# Patient Record
Sex: Male | Born: 1950
Health system: Southern US, Community
[De-identification: ages and names within clinical notes are randomized; demographics above are authoritative.]

## PROBLEM LIST (undated history)

## (undated) DIAGNOSIS — M359 Systemic involvement of connective tissue, unspecified: Secondary | ICD-10-CM

## (undated) DIAGNOSIS — M1990 Unspecified osteoarthritis, unspecified site: Secondary | ICD-10-CM

## (undated) DIAGNOSIS — R39198 Other difficulties with micturition: Secondary | ICD-10-CM

## (undated) DIAGNOSIS — I729 Aneurysm of unspecified site: Secondary | ICD-10-CM

## (undated) DIAGNOSIS — G8929 Other chronic pain: Secondary | ICD-10-CM

## (undated) DIAGNOSIS — E039 Hypothyroidism, unspecified: Secondary | ICD-10-CM

## (undated) DIAGNOSIS — M797 Fibromyalgia: Secondary | ICD-10-CM

## (undated) DIAGNOSIS — J439 Emphysema, unspecified: Secondary | ICD-10-CM

## (undated) DIAGNOSIS — R233 Spontaneous ecchymoses: Secondary | ICD-10-CM

## (undated) DIAGNOSIS — K4090 Unilateral inguinal hernia, without obstruction or gangrene, not specified as recurrent: Secondary | ICD-10-CM

## (undated) DIAGNOSIS — N400 Enlarged prostate without lower urinary tract symptoms: Secondary | ICD-10-CM

## (undated) DIAGNOSIS — J449 Chronic obstructive pulmonary disease, unspecified: Secondary | ICD-10-CM

## (undated) DIAGNOSIS — R519 Headache, unspecified: Secondary | ICD-10-CM

## (undated) DIAGNOSIS — M0520 Rheumatoid vasculitis with rheumatoid arthritis of unspecified site: Secondary | ICD-10-CM

## (undated) DIAGNOSIS — G4700 Insomnia, unspecified: Secondary | ICD-10-CM

## (undated) DIAGNOSIS — M549 Dorsalgia, unspecified: Secondary | ICD-10-CM

## (undated) DIAGNOSIS — W57XXXA Bitten or stung by nonvenomous insect and other nonvenomous arthropods, initial encounter: Secondary | ICD-10-CM

## (undated) DIAGNOSIS — Z8719 Personal history of other diseases of the digestive system: Secondary | ICD-10-CM

## (undated) DIAGNOSIS — J189 Pneumonia, unspecified organism: Secondary | ICD-10-CM

## (undated) DIAGNOSIS — S30861A Insect bite (nonvenomous) of abdominal wall, initial encounter: Secondary | ICD-10-CM

## (undated) DIAGNOSIS — R238 Other skin changes: Secondary | ICD-10-CM

## (undated) DIAGNOSIS — R112 Nausea with vomiting, unspecified: Secondary | ICD-10-CM

## (undated) DIAGNOSIS — E079 Disorder of thyroid, unspecified: Secondary | ICD-10-CM

## (undated) DIAGNOSIS — K648 Other hemorrhoids: Secondary | ICD-10-CM

## (undated) DIAGNOSIS — K759 Inflammatory liver disease, unspecified: Secondary | ICD-10-CM

## (undated) DIAGNOSIS — R0602 Shortness of breath: Secondary | ICD-10-CM

## (undated) DIAGNOSIS — K2970 Gastritis, unspecified, without bleeding: Secondary | ICD-10-CM

## (undated) DIAGNOSIS — Z9889 Other specified postprocedural states: Secondary | ICD-10-CM

## (undated) DIAGNOSIS — J42 Unspecified chronic bronchitis: Secondary | ICD-10-CM

## (undated) DIAGNOSIS — J45909 Unspecified asthma, uncomplicated: Secondary | ICD-10-CM

## (undated) DIAGNOSIS — R51 Headache: Secondary | ICD-10-CM

## (undated) HISTORY — DX: Unilateral inguinal hernia, without obstruction or gangrene, not specified as recurrent: K40.90

## (undated) HISTORY — PX: SHOULDER ARTHROSCOPY W/ ROTATOR CUFF REPAIR: SHX2400

## (undated) HISTORY — PX: REPLACEMENT TOTAL KNEE: SUR1224

## (undated) HISTORY — DX: Aneurysm of unspecified site: I72.9

## (undated) HISTORY — DX: Rheumatoid vasculitis with rheumatoid arthritis of unspecified site: M05.20

## (undated) HISTORY — PX: HAMMER TOE SURGERY: SHX385

## (undated) HISTORY — DX: Other hemorrhoids: K64.8

## (undated) HISTORY — PX: CARPAL TUNNEL RELEASE: SHX101

## (undated) HISTORY — DX: Unspecified osteoarthritis, unspecified site: M19.90

## (undated) HISTORY — DX: Insomnia, unspecified: G47.00

## (undated) HISTORY — PX: ESOPHAGOGASTRODUODENOSCOPY (EGD) WITH ESOPHAGEAL DILATION: SHX5812

## (undated) HISTORY — PX: LUMBAR DISC SURGERY: SHX700

## (undated) HISTORY — PX: TOTAL HIP ARTHROPLASTY: SHX124

## (undated) HISTORY — DX: Gastritis, unspecified, without bleeding: K29.70

## (undated) HISTORY — PX: NASAL SINUS SURGERY: SHX719

## (undated) HISTORY — PX: COLONOSCOPY: SHX174

## (undated) HISTORY — PX: HERNIA REPAIR: SHX51

## (undated) HISTORY — PX: LUMBAR SPINE SURGERY: SHX701

## (undated) HISTORY — PX: KNEE ARTHROSCOPY: SUR90

---

## 1997-08-08 ENCOUNTER — Ambulatory Visit: Admission: RE | Admit: 1997-08-08 | Discharge: 1997-08-08 | Payer: Self-pay | Admitting: Pulmonary Disease

## 1997-10-11 ENCOUNTER — Ambulatory Visit (HOSPITAL_COMMUNITY): Admission: RE | Admit: 1997-10-11 | Discharge: 1997-10-11 | Payer: Self-pay | Admitting: Pulmonary Disease

## 1997-11-18 ENCOUNTER — Ambulatory Visit (HOSPITAL_BASED_OUTPATIENT_CLINIC_OR_DEPARTMENT_OTHER): Admission: RE | Admit: 1997-11-18 | Discharge: 1997-11-18 | Payer: Self-pay | Admitting: *Deleted

## 1999-09-23 ENCOUNTER — Encounter: Payer: Self-pay | Admitting: Orthopedic Surgery

## 1999-09-23 ENCOUNTER — Ambulatory Visit (HOSPITAL_COMMUNITY): Admission: RE | Admit: 1999-09-23 | Discharge: 1999-09-23 | Payer: Self-pay | Admitting: Orthopedic Surgery

## 1999-10-21 ENCOUNTER — Encounter: Payer: Self-pay | Admitting: Orthopedic Surgery

## 1999-10-26 ENCOUNTER — Encounter: Payer: Self-pay | Admitting: Orthopedic Surgery

## 1999-10-26 ENCOUNTER — Inpatient Hospital Stay (HOSPITAL_COMMUNITY): Admission: RE | Admit: 1999-10-26 | Discharge: 1999-10-30 | Payer: Self-pay | Admitting: Orthopedic Surgery

## 1999-10-26 ENCOUNTER — Encounter (INDEPENDENT_AMBULATORY_CARE_PROVIDER_SITE_OTHER): Payer: Self-pay | Admitting: Specialist

## 1999-10-28 ENCOUNTER — Encounter: Payer: Self-pay | Admitting: Orthopedic Surgery

## 2000-03-17 ENCOUNTER — Encounter: Payer: Self-pay | Admitting: Orthopedic Surgery

## 2000-03-18 ENCOUNTER — Inpatient Hospital Stay (HOSPITAL_COMMUNITY): Admission: RE | Admit: 2000-03-18 | Discharge: 2000-03-22 | Payer: Self-pay | Admitting: Orthopedic Surgery

## 2000-03-20 ENCOUNTER — Encounter: Payer: Self-pay | Admitting: Orthopedic Surgery

## 2000-04-08 ENCOUNTER — Ambulatory Visit (HOSPITAL_COMMUNITY): Admission: RE | Admit: 2000-04-08 | Discharge: 2000-04-08 | Payer: Self-pay | Admitting: Orthopedic Surgery

## 2000-06-03 ENCOUNTER — Encounter: Payer: Self-pay | Admitting: Pulmonary Disease

## 2000-06-03 ENCOUNTER — Ambulatory Visit (HOSPITAL_COMMUNITY): Admission: RE | Admit: 2000-06-03 | Discharge: 2000-06-03 | Payer: Self-pay | Admitting: Pulmonary Disease

## 2000-09-02 ENCOUNTER — Ambulatory Visit (HOSPITAL_COMMUNITY): Admission: RE | Admit: 2000-09-02 | Discharge: 2000-09-02 | Payer: Self-pay | Admitting: Pulmonary Disease

## 2000-09-02 ENCOUNTER — Encounter: Payer: Self-pay | Admitting: Pulmonary Disease

## 2000-09-28 ENCOUNTER — Other Ambulatory Visit: Admission: RE | Admit: 2000-09-28 | Discharge: 2000-09-28 | Payer: Self-pay | Admitting: *Deleted

## 2000-09-28 ENCOUNTER — Encounter (INDEPENDENT_AMBULATORY_CARE_PROVIDER_SITE_OTHER): Payer: Self-pay | Admitting: Specialist

## 2001-02-02 ENCOUNTER — Encounter (INDEPENDENT_AMBULATORY_CARE_PROVIDER_SITE_OTHER): Payer: Self-pay

## 2001-02-02 ENCOUNTER — Encounter: Payer: Self-pay | Admitting: Orthopedic Surgery

## 2001-02-02 ENCOUNTER — Observation Stay (HOSPITAL_COMMUNITY): Admission: RE | Admit: 2001-02-02 | Discharge: 2001-02-03 | Payer: Self-pay | Admitting: Orthopedic Surgery

## 2001-10-24 ENCOUNTER — Ambulatory Visit (HOSPITAL_BASED_OUTPATIENT_CLINIC_OR_DEPARTMENT_OTHER): Admission: RE | Admit: 2001-10-24 | Discharge: 2001-10-24 | Payer: Self-pay | Admitting: Orthopedic Surgery

## 2002-06-20 ENCOUNTER — Ambulatory Visit (HOSPITAL_COMMUNITY): Admission: RE | Admit: 2002-06-20 | Discharge: 2002-06-20 | Payer: Self-pay | Admitting: Orthopedic Surgery

## 2002-06-20 ENCOUNTER — Encounter: Payer: Self-pay | Admitting: Orthopedic Surgery

## 2003-03-13 ENCOUNTER — Encounter (HOSPITAL_COMMUNITY): Admission: RE | Admit: 2003-03-13 | Discharge: 2003-06-11 | Payer: Self-pay | Admitting: Otolaryngology

## 2003-11-19 ENCOUNTER — Observation Stay (HOSPITAL_COMMUNITY): Admission: RE | Admit: 2003-11-19 | Discharge: 2003-11-20 | Payer: Self-pay | Admitting: Orthopedic Surgery

## 2003-11-19 ENCOUNTER — Encounter (INDEPENDENT_AMBULATORY_CARE_PROVIDER_SITE_OTHER): Payer: Self-pay | Admitting: *Deleted

## 2004-10-28 ENCOUNTER — Ambulatory Visit: Payer: Self-pay | Admitting: Internal Medicine

## 2004-11-12 ENCOUNTER — Ambulatory Visit: Payer: Self-pay | Admitting: Pulmonary Disease

## 2005-08-04 ENCOUNTER — Encounter: Admission: RE | Admit: 2005-08-04 | Discharge: 2005-08-04 | Payer: Self-pay | Admitting: Orthopedic Surgery

## 2005-08-19 ENCOUNTER — Encounter: Admission: RE | Admit: 2005-08-19 | Discharge: 2005-08-19 | Payer: Self-pay | Admitting: Orthopedic Surgery

## 2005-09-24 ENCOUNTER — Ambulatory Visit (HOSPITAL_COMMUNITY): Admission: RE | Admit: 2005-09-24 | Discharge: 2005-09-25 | Payer: Self-pay | Admitting: Orthopedic Surgery

## 2007-03-03 ENCOUNTER — Inpatient Hospital Stay (HOSPITAL_COMMUNITY): Admission: AD | Admit: 2007-03-03 | Discharge: 2007-03-07 | Payer: Self-pay | Admitting: Otolaryngology

## 2008-02-09 HISTORY — PX: SPINAL CORD STIMULATOR IMPLANT: SHX2422

## 2008-09-27 ENCOUNTER — Encounter: Admission: RE | Admit: 2008-09-27 | Discharge: 2008-09-27 | Payer: Self-pay | Admitting: Orthopedic Surgery

## 2008-10-04 ENCOUNTER — Ambulatory Visit: Payer: Self-pay | Admitting: Cardiology

## 2008-10-16 ENCOUNTER — Ambulatory Visit (HOSPITAL_COMMUNITY): Admission: RE | Admit: 2008-10-16 | Discharge: 2008-10-17 | Payer: Self-pay | Admitting: Orthopedic Surgery

## 2009-12-03 ENCOUNTER — Encounter: Payer: Self-pay | Admitting: Emergency Medicine

## 2009-12-09 DIAGNOSIS — M545 Low back pain, unspecified: Secondary | ICD-10-CM | POA: Insufficient documentation

## 2009-12-09 DIAGNOSIS — M069 Rheumatoid arthritis, unspecified: Secondary | ICD-10-CM | POA: Insufficient documentation

## 2009-12-09 DIAGNOSIS — E039 Hypothyroidism, unspecified: Secondary | ICD-10-CM | POA: Insufficient documentation

## 2009-12-10 ENCOUNTER — Ambulatory Visit: Payer: Self-pay | Admitting: Emergency Medicine

## 2009-12-10 DIAGNOSIS — R0602 Shortness of breath: Secondary | ICD-10-CM | POA: Insufficient documentation

## 2009-12-10 DIAGNOSIS — J45909 Unspecified asthma, uncomplicated: Secondary | ICD-10-CM | POA: Insufficient documentation

## 2010-01-09 ENCOUNTER — Ambulatory Visit: Payer: Self-pay | Admitting: Emergency Medicine

## 2010-01-09 DIAGNOSIS — J4489 Other specified chronic obstructive pulmonary disease: Secondary | ICD-10-CM | POA: Insufficient documentation

## 2010-01-09 DIAGNOSIS — J449 Chronic obstructive pulmonary disease, unspecified: Secondary | ICD-10-CM | POA: Insufficient documentation

## 2010-01-25 ENCOUNTER — Emergency Department (HOSPITAL_COMMUNITY)
Admission: EM | Admit: 2010-01-25 | Discharge: 2010-01-25 | Payer: Self-pay | Source: Home / Self Care | Admitting: Emergency Medicine

## 2010-01-30 ENCOUNTER — Encounter: Payer: Self-pay | Admitting: Emergency Medicine

## 2010-01-30 ENCOUNTER — Telehealth (INDEPENDENT_AMBULATORY_CARE_PROVIDER_SITE_OTHER): Payer: Self-pay | Admitting: *Deleted

## 2010-02-03 ENCOUNTER — Telehealth: Payer: Self-pay | Admitting: Emergency Medicine

## 2010-02-06 ENCOUNTER — Ambulatory Visit: Payer: Self-pay | Admitting: Emergency Medicine

## 2010-03-05 ENCOUNTER — Encounter: Payer: Self-pay | Admitting: Emergency Medicine

## 2010-03-10 ENCOUNTER — Encounter: Payer: Self-pay | Admitting: Emergency Medicine

## 2010-03-10 NOTE — Assessment & Plan Note (Signed)
Summary: dyspnea, COPD/asthma, RA/MTX   Visit Type:  Initial Consult Copy to:  Dr. Marylene Land Hawks---Rheumatologist Primary Provider/Referring Provider:  Dr. Neita Carp  CC:  Pulmonary consult...patient c/o increased SOB with exertion and at rest for 2-3 months.  History of Present Illness: 60 yo former smoker, hx of RA on chronic pred and MTX (started 2011).  Childhood asthma that he grew out of. He has had trouble as an adult when he gets a URI. Has been freq treated with He has exertional dyspnea that is worse over about 2 months. Hears wheezing with exertion. As part eval had CXR, not available at this time. He tell me that the film was abnormal, not sure what it showed. Has seen Dr Sung Amabile before, has been on Advair, albuterol, maybe others.   Preventive Screening-Counseling & Management  Alcohol-Tobacco     Alcohol drinks/day: 0     Smoking Status: quit     Smoking Cessation Counseling: yes     Smoke Cessation Stage: quit     Packs/Day: 3.0     Year Started: 1964     Year Quit: 2000     Tobacco Counseling: to remain off tobacco products  Current Medications (verified): 1)  Levothyroxine Sodium 75 Mcg Tabs (Levothyroxine Sodium) .Marland Kitchen.. 1 Once Daily 2)  Avinza 45 Mg Xr24h-Cap (Morphine Sulfate Beads) .Marland Kitchen.. 1 Two Times A Day As Needed 3)  Endocet 10-325 Mg Tabs (Oxycodone-Acetaminophen) .Marland Kitchen.. 1 Once Daily As Needed 4)  Zolpidem Tartrate 10 Mg Tabs (Zolpidem Tartrate) .Marland Kitchen.. 1 At Bedtime As Needed 5)  Acetaminophen 500 Mg Tabs (Acetaminophen) .Marland Kitchen.. 1 Every 6 Hrs As Needed 6)  Prednisone 5 Mg Tabs (Prednisone) .... As Directed 7)  Folic Acid 1 Mg Tabs (Folic Acid) .Marland Kitchen.. 1 Once Daily 8)  Methotrexate 2.5 Mg Tabs (Methotrexate Sodium) .... 8 Tablets Every Week  Allergies (verified): 1)  ! Pcn  Past History:  Past Medical History: Current Problems:  ASTHMA (ICD-493.90) HYPOTHYROIDISM (ICD-244.9) BACK PAIN, LUMBAR (ICD-724.2) ARTHRITIS, RHEUMATOID (ICD-714.0)  Past Surgical History: Back  surgery x2 Sinus surgery x3 Total Hip Arthroplasty---left Total Knee Arthroplasty---left right foot surgery  Family History: Family History Asthma---father Family History Emphysema ---father Heart disease---father Throat cancer---brother  Social History: Former smoker. Quit in 2000.  married disabledAlcohol drinks/day:  0 Smoking Status:  quit Packs/Day:  3.0  Review of Systems       The patient complains of shortness of breath with activity, shortness of breath at rest, difficulty swallowing, headaches, nasal congestion/difficulty breathing through nose, hand/feet swelling, and joint stiffness or pain.  The patient denies productive cough, non-productive cough, coughing up blood, chest pain, irregular heartbeats, acid heartburn, indigestion, loss of appetite, weight change, abdominal pain, sore throat, tooth/dental problems, sneezing, itching, ear ache, anxiety, depression, rash, change in color of mucus, and fever.    Vital Signs:  Patient profile:   60 year old male Height:      70 inches (177.80 cm) Weight:      192.50 pounds (87.50 kg) BMI:     27.72 O2 Sat:      94 % on Room air Temp:     98.2 degrees F (36.78 degrees C) oral Pulse rate:   86 / minute BP sitting:   152 / 84  (left arm) Cuff size:   regular  Vitals Entered By: Michel Bickers CMA (December 10, 2009 3:37 PM)  O2 Sat at Rest %:  94 O2 Flow:  Room air  Serial Vital Signs/Assessments:  Comments: 4:16 PM Ambulatory  Pulse Oximetry  Resting; HR__81___    02 Sat__94% on room air___  Lap1 (185 feet)   HR_97____   02 Sat__97% on room air___ Lap2 (185 feet)   HR__96___   02 Sat__97% on room air___    Lap3 (185 feet)   HR_96____   02 Sat__97% on room air___  _x__Test Completed without Difficulty ___Test Stopped due to:  By: Michel Bickers CMA   CC: Pulmonary consult...patient c/o increased SOB with exertion and at rest for 2-3 months Comments Medications reviewed with patient Michel Bickers CMA  December 10, 2009 3:37 PM   Physical Exam  General:  normal appearance and healthy appearing.   Head:  normocephalic and atraumatic Eyes:  conjunctiva and sclera clear Nose:  no deformity, discharge, inflammation, or lesions Mouth:  no deformity or lesions Neck:  no masses, thyromegaly, or abnormal cervical nodes Lungs:  distant, mild low-pitched wheeze on exp Heart:  regular rate and rhythm, S1, S2 without murmurs, rubs, gallops, or clicks Abdomen:  not examined Extremities:  no edema Neurologic:  non-focal Skin:  intact without lesions or rashes Psych:  alert and cooperative; normal mood and affect; normal attention span and concentration   Impression & Recommendations:  Problem # 1:  DYSPNEA (ICD-786.05)  Suspect due to COPD +/- asthma. Consider effects or RA or MTX but no crackles on exam.  - full PFT - obtain his CXR to review - review old chart to see which regimens have been tries to date.  - ROV next available  Orders: Consultation Level IV (66440)  Patient Instructions: 1)  We will obtain your CXR from Dr Nickola Major 2)  We will perform full pulmonary function tests at your next office visit 3)  Walking oximetry today 4)  Follow up with Dr Delton Coombes at next available appointment

## 2010-03-10 NOTE — Miscellaneous (Signed)
Summary: Orders Update pft charges  Clinical Lists Changes  Orders: Added new Service order of Carbon Monoxide diffusing w/capacity (94720) - Signed Added new Service order of Lung Volumes (94240) - Signed Added new Service order of Spirometry (Pre & Post) (94060) - Signed 

## 2010-03-10 NOTE — Assessment & Plan Note (Signed)
Summary: COPD, RA/MTX   Visit Type:  Follow-up Copy to:  Dr. Marylene Land Hawks---Rheumatologist Primary Provider/Referring Provider:  Dr. Neita Carp  CC:  Followup COPD- Pt had PFT's today. pt quit smoking in 2001.  History of Present Illness: 60 yo former smoker, hx of RA on chronic pred and MTX (started 2011).  Childhood asthma that he grew out of. He has had trouble as an adult when he gets a URI. Has been freq treated with He has exertional dyspnea that is worse over about 2 months. Hears wheezing with exertion. As part eval had CXR, not available at this time. He tell me that the film was abnormal, not sure what it showed. Has seen Dr Sung Amabile before, has been on Advair, albuterol, maybe others.   ROV 01/09/10 -- dyspnea, hx tobacco, RA/MTX. Has been on Spiriva and Advair before per old chart in 05, 06. PFT today show moderate AFL, borderline BD response, no restriction. Normal DLCO. Has siblings w COPD.   Current Medications (verified): 1)  Levothyroxine Sodium 75 Mcg Tabs (Levothyroxine Sodium) .Marland Kitchen.. 1 Once Daily 2)  Avinza 45 Mg Xr24h-Cap (Morphine Sulfate Beads) .Marland Kitchen.. 1 Two Times A Day 3)  Endocet 10-325 Mg Tabs (Oxycodone-Acetaminophen) .Marland Kitchen.. 1-3 Tablet A Day As Needed 4)  Zolpidem Tartrate 10 Mg Tabs (Zolpidem Tartrate) .Marland Kitchen.. 1 At Bedtime 5)  Acetaminophen 500 Mg Tabs (Acetaminophen) .Marland Kitchen.. 1 Every 6 Hrs As Needed 6)  Folic Acid 1 Mg Tabs (Folic Acid) .Marland Kitchen.. 1 Once Daily 7)  Methotrexate 2.5 Mg Tabs (Methotrexate Sodium) .... 8 Tablets Every Week 8)  Ibuprofen 200 Mg Caps (Ibuprofen) .... As Needed  Allergies (verified): 1)  ! Pcn  Vital Signs:  Patient profile:   60 year old male Height:      70 inches Weight:      194 pounds O2 Sat:      96 % on Room air Temp:     98.0 degrees F oral Pulse rate:   77 / minute BP sitting:   138 / 82  (left arm) Cuff size:   regular  Vitals Entered By: Vernie Murders (January 09, 2010 3:49 PM)  O2 Flow:  Room air CC: Followup COPD- Pt had PFT's  today. pt quit smoking in 2001 Comments meds and allergies updated Phone number updated Carver Fila  January 09, 2010 3:54 PM    Physical Exam  General:  normal appearance and healthy appearing.   Head:  normocephalic and atraumatic Eyes:  conjunctiva and sclera clear Nose:  no deformity, discharge, inflammation, or lesions Mouth:  no deformity or lesions Neck:  no masses, thyromegaly, or abnormal cervical nodes Lungs:  distant, mild low-pitched wheeze on exp Heart:  regular rate and rhythm, S1, S2 without murmurs, rubs, gallops, or clicks Abdomen:  not examined Extremities:  no edema Neurologic:  non-focal Skin:  intact without lesions or rashes Psych:  alert and cooperative; normal mood and affect; normal attention span and concentration   Pulmonary Function Test Date: 01/09/2010 Height (in.): 70 Gender: Male  Pre-Spirometry FVC    Value: 3.76 L/min   Pred: 4.64 L/min     % Pred: 81 % FEV1    Value: 2.00 L     Pred: 3.29 L     % Pred: 61 % FEV1/FVC  Value: 53 %     Pred: 71 %     % Pred: - % FEF 25-75  Value: 0.57 L/min   Pred: 3.15 L/min     % Pred: 18 %  Post-Spirometry FVC    Value: 4.17 L/min   Pred: 4.64 L/min     % Pred: 90 % FEV1    Value: 2.10 L     Pred: 3.29 L     % Pred: 64 % FEV1/FVC  Value: 50 %     Pred: 71 %     % Pred: - % FEF 25-75  Value: 0.59 L/min   Pred: 3.15 L/min     % Pred: 19 %  Lung Volumes TLC    Value: 6.47 L   % Pred: 97 % RV    Value: 2.71 L   % Pred: 115 % DLCO    Value: 23.3 %   % Pred: 94 % DLCO/VA  Value: 4.63 %   % Pred: 122 %  Impression & Recommendations:  Problem # 1:  COPD (ICD-496) - trial symbicort - a1-at testing - rov in 1 mo to see if he benefits  Problem # 2:  ARTHRITIS, RHEUMATOID (ICD-714.0)  - CXR today to look for RA or MTX disease  Orders: Est. Patient Level IV (99214) T-2 View CXR (71020TC)  Medications Added to Medication List This Visit: 1)  Avinza 45 Mg Xr24h-cap (Morphine sulfate beads) .Marland Kitchen.. 1 two  times a day 2)  Endocet 10-325 Mg Tabs (Oxycodone-acetaminophen) .Marland Kitchen.. 1-3 tablet a day as needed 3)  Zolpidem Tartrate 10 Mg Tabs (Zolpidem tartrate) .Marland Kitchen.. 1 at bedtime 4)  Ibuprofen 200 Mg Caps (Ibuprofen) .... As needed  Patient Instructions: 1)  Repeat CXR today 2)  Alpha-1 antitrypsin testing today (to look for emphysema that runs in the family).  3)  We will try Symbicort 80/4.80mcg, 2 puffs two times a day to see if you benefit. 4)  Follow with Dr Delton Coombes in 1 month to discuss your symptoms on the new medication.

## 2010-03-12 NOTE — Progress Notes (Signed)
Summary: FYI:  ER visit for SOB/congestion  Phone Note Outgoing Call   Call placed by: Michel Bickers CMA,  January 30, 2010 10:14 AM Call placed to: Patient Summary of Call: Called to tell pt Alpha 1 test was normal per Dr. Delton Coombes. Pt wanted to let RB know that he was seen at [facility] ER on 01/24/2010 for incr SOB and congestion. He has stopped Symbicort and feels this caused his nasal congestion and SOB.  He was given a steroid injection and Ventolin HFA inhaler and will use this if necessary.  I will forward to RB so he is aware. Initial call taken by: Michel Bickers CMA,  January 30, 2010 10:22 AM

## 2010-03-12 NOTE — Letter (Signed)
Summary: Alpha 1 test  Registration Update   Imported By: Valinda Hoar 01/30/2010 11:38:42  _____________________________________________________________________  External Attachment:    Type:   Image     Comment:   External Document  Appended Document: Registration Update Alpha 1 test is normal per Dr. Delton Coombes and pt is aware.

## 2010-03-12 NOTE — Progress Notes (Signed)
Summary: resutls and follow up appoinmtments  Phone Note Call from Patient Call back at Home Phone [phone]   Caller: Patient Call For: Dr. Delton Coombes Summary of Call: Patient phoned stated that he received a call regaring his results and stated that he was informed that everything was normal. He wanted to know if he still needs to follow up with the doctor. He can be reached at [phone] Initial call taken by: Vedia Coffer,  February 03, 2010 9:25 AM  Follow-up for Phone Call        called spoke with patient who saw RB on 12.2.11 and was started on symbicort.  pt states he went to the ED on 12.17.11 d/t SOB and congestion and subsequently stopped the symbicort.  pt would like to know what RB recommends: should he keep the 1.17.12 appt?  please advise, thanks!  *pt is aware RB not in office until 12.28 afternoon. Follow-up by: Boone Master CNA/MA,  February 03, 2010 10:32 AM  Additional Follow-up for Phone Call Additional follow up Details #1::        To clarify: His a1-AT testing (for genetic predisposition to emphysema) is normal. His PFT are consistent with COPD, which is why we started the Symbicort. I would recommend he do the symbicort two times a day until our 02/24/10 and keep track of symptoms so we can decide if he benefits.  Additional Follow-up by: Leslye Peer MD,  February 04, 2010 5:06 PM    Additional Follow-up for Phone Call Additional follow up Details #2::    LMOMTCB Vernie Murders  February 04, 2010 5:09 PM  [email or web address].Carron Curie CMA  February 05, 2010 11:03 AM  pt advised to keep appt on 02-24-09. pt states he will not start back on symbicort because it makes him worse. I advised he can disuss with RB at appt.Carron Curie CMA  February 05, 2010 2:53 PM

## 2010-03-12 NOTE — Assessment & Plan Note (Addendum)
Summary: rov 1 month ///kp   Visit Type:  Follow-up Copy to:  Dr. Marylene Land Hawks---Rheumatologist Primary Provider/Referring Provider:  Dr. Neita Carp  CC:  COPD...pt d/c'ed Symbicort due to incr SOB.  History of Present Illness: 60 yo former smoker, hx of RA on chronic pred and MTX (started 2011).  Childhood asthma that he grew out of. He has had trouble as an adult when he gets a URI. Has been freq treated with He has exertional dyspnea that is worse over about 2 months. Hears wheezing with exertion. As part eval had CXR, not available at this time. He tell me that the film was abnormal, not sure what it showed. Has seen Dr Sung Amabile before, has been on Advair, albuterol, maybe others.   ROV 01/09/10 -- dyspnea, hx tobacco, RA/MTX. Has been on Spiriva and Advair before per old chart in 05, 06. PFT today show moderate AFL, borderline BD response, no restriction. Normal DLCO. Has siblings w COPD.   February 24, 2010 --Presents for follow up. Last office visit trial of symbicort was added. Pt did not see any benefit. and prefers to not take meds at this time. " I do not want to be dependent on inhalers-just want to stay active". Despite much pt education on fxn of inhalers and to preserve lung fxn, and activitytolerance he declined medications at this time. Alpha  ~1 neg.  Denies chest pain,  orthopnea, hemoptysis, fever, n/v/d, edema, headache,recent travel or antibiotics.     Preventive Screening-Counseling & Management  Alcohol-Tobacco     Alcohol drinks/day: 0     Smoking Status: quit     Packs/Day: 3.0     Year Started: 1964     Year Quit: 2000     Tobacco Counseling: to remain off tobacco products  Medications Prior to Update: 1)  Levothyroxine Sodium 75 Mcg Tabs (Levothyroxine Sodium) .Marland Kitchen.. 1 Once Daily 2)  Avinza 45 Mg Xr24h-Cap (Morphine Sulfate Beads) .Marland Kitchen.. 1 Two Times A Day 3)  Endocet 10-325 Mg Tabs (Oxycodone-Acetaminophen) .Marland Kitchen.. 1-3 Tablet A Day As Needed 4)  Zolpidem Tartrate 10 Mg  Tabs (Zolpidem Tartrate) .Marland Kitchen.. 1 At Bedtime 5)  Acetaminophen 500 Mg Tabs (Acetaminophen) .Marland Kitchen.. 1 Every 6 Hrs As Needed 6)  Folic Acid 1 Mg Tabs (Folic Acid) .Marland Kitchen.. 1 Once Daily 7)  Methotrexate 2.5 Mg Tabs (Methotrexate Sodium) .... 8 Tablets Every Week 8)  Ibuprofen 200 Mg Caps (Ibuprofen) .... As Needed 9)  Proair Hfa 108 (90 Base) Mcg/act Aers (Albuterol Sulfate) .... 2 Puffs Four Times Daily As Needed  Current Medications (verified): 1)  Levothyroxine Sodium 75 Mcg Tabs (Levothyroxine Sodium) .Marland Kitchen.. 1 Once Daily 2)  Avinza 45 Mg Xr24h-Cap (Morphine Sulfate Beads) .Marland Kitchen.. 1 Two Times A Day 3)  Endocet 10-325 Mg Tabs (Oxycodone-Acetaminophen) .Marland Kitchen.. 1-3 Tablet A Day As Needed 4)  Zolpidem Tartrate 10 Mg Tabs (Zolpidem Tartrate) .Marland Kitchen.. 1 At Bedtime 5)  Acetaminophen 500 Mg Tabs (Acetaminophen) .Marland Kitchen.. 1 Every 6 Hrs As Needed 6)  Folic Acid 1 Mg Tabs (Folic Acid) .Marland Kitchen.. 1 Once Daily 7)  Methotrexate 2.5 Mg Tabs (Methotrexate Sodium) .... 8 Tablets Every Week 8)  Ibuprofen 200 Mg Caps (Ibuprofen) .... As Needed 9)  Proair Hfa 108 (90 Base) Mcg/act Aers (Albuterol Sulfate) .... 2 Puffs Four Times Daily As Needed  Allergies (verified): 1)  ! Pcn  Past History:  Past Medical History: Last updated: 12/10/2009 Current Problems:  ASTHMA (ICD-493.90) HYPOTHYROIDISM (ICD-244.9) BACK PAIN, LUMBAR (ICD-724.2) ARTHRITIS, RHEUMATOID (ICD-714.0)  Past Surgical History: Last updated:  12/10/2009 Back surgery x2 Sinus surgery x3 Total Hip Arthroplasty---left Total Knee Arthroplasty---left right foot surgery  Family History: Last updated: 12/10/2009 Family History Asthma---father Family History Emphysema ---father Heart disease---father Throat cancer---brother  Social History: Last updated: 12/10/2009 Former smoker. Quit in 2000.  married disabled  Risk Factors: Smoking Status: quit (02/24/2010) Packs/Day: 3.0 (02/24/2010)  Review of Systems      See HPI  Vital Signs:  Patient profile:   60  year old male Height:      70 inches (177.80 cm) Weight:      198.56 pounds (90.25 kg) BMI:     28.59 O2 Sat:      95 % on Room air Temp:     98.1 degrees F (36.72 degrees C) oral Pulse rate:   79 / minute BP sitting:   166 / 88  (left arm) Cuff size:   regular  Vitals Entered By: Michel Bickers CMA (February 24, 2010 11:59 AM)  O2 Sat at Rest %:  95 O2 Flow:  Room air CC: COPD...pt d/c'ed Symbicort due to incr SOB Is Patient Diabetic? No Comments Medications reviewed with patient Michel Bickers CMA  February 24, 2010 12:02 PM   Physical Exam  Additional Exam:  GEN: A/Ox3; pleasant , NAD HEENT:  [place]/AT, , EACs-clear, TMs-wnl, NOSE-clear, THROAT-clear NECK:  Supple w/ fair ROM; no JVD; normal carotid impulses w/o bruits; no thyromegaly or nodules palpated; no lymphadenopathy. RESP  Clear to P & A diminished in bases  CARD:  RRR, no m/r/g   GI:   Soft & nt; nml bowel sounds; no organomegaly or masses detected. [place]co: Warm bil,  no calf tenderness edema, clubbing, pulses intact Neuro: intact w/ no focal deficits noted.   Impression & Recommendations:  Problem # 1:  COPD (ICD-496) alpha 1 neg.  cont with current regimen follow up 6months -reevaluate for rx therapy.  Other Orders: Est. Patient Level II (16109)  Patient Instructions: 1)  Continue on current regimen.  2)  follow up Dr. Delton Coombes in 6 months and as needed

## 2010-03-12 NOTE — Assessment & Plan Note (Signed)
Summary: rov 1 month ///kp   Visit Type:  Follow-up Copy to:  Dr. Marylene Land Hawks---Rheumatologist Primary Provider/Referring Provider:  Dr. Neita Carp  CC:  COPD...discontinued Symbicort due to incr SOB.  History of Present Illness: 60 yo former smoker, hx of RA on chronic pred and MTX (started 2011).  Childhood asthma that he grew out of. He has had trouble as an adult when he gets a URI. Has been freq treated with He has exertional dyspnea that is worse over about 2 months. Hears wheezing with exertion. As part eval had CXR, not available at this time. He tell me that the film was abnormal, not sure what it showed. Has seen Dr Sung Amabile before, has been on Advair, albuterol, maybe others.   ROV 01/09/10 -- dyspnea, hx tobacco, RA/MTX. Has been on Spiriva and Advair before per old chart in 05, 06. PFT today show moderate AFL, borderline BD response, no restriction. Normal DLCO. Has siblings w COPD.   ROV 02/24/10 --   Current Medications (verified): 1)  Levothyroxine Sodium 75 Mcg Tabs (Levothyroxine Sodium) .Marland Kitchen.. 1 Once Daily 2)  Avinza 45 Mg Xr24h-Cap (Morphine Sulfate Beads) .Marland Kitchen.. 1 Two Times A Day 3)  Endocet 10-325 Mg Tabs (Oxycodone-Acetaminophen) .Marland Kitchen.. 1-3 Tablet A Day As Needed 4)  Zolpidem Tartrate 10 Mg Tabs (Zolpidem Tartrate) .Marland Kitchen.. 1 At Bedtime 5)  Acetaminophen 500 Mg Tabs (Acetaminophen) .Marland Kitchen.. 1 Every 6 Hrs As Needed 6)  Folic Acid 1 Mg Tabs (Folic Acid) .Marland Kitchen.. 1 Once Daily 7)  Methotrexate 2.5 Mg Tabs (Methotrexate Sodium) .... 8 Tablets Every Week 8)  Ibuprofen 200 Mg Caps (Ibuprofen) .... As Needed 9)  Proair Hfa 108 (90 Base) Mcg/act Aers (Albuterol Sulfate) .... 2 Puffs Four Times Daily As Needed  Allergies (verified): 1)  ! Pcn  Vital Signs:  Patient profile:   60 year old male Height:      70 inches (177.80 cm) Weight:      197.25 pounds (89.66 kg) BMI:     28.40 O2 Sat:      95 % on Room air Temp:     98.1 degrees F (36.72 degrees C) oral Pulse rate:   79 / minute BP  sitting:   166 / 88  (left arm) Cuff size:   regular  Vitals Entered By: Michel Bickers CMA (February 24, 2010 11:49 AM)  O2 Sat at Rest %:  95 O2 Flow:  Room air CC: COPD...discontinued Symbicort due to incr SOB Comments Medications reviewed with patient Michel Bickers [facility]  February 24, 2010 11:49 AM    Medications Added to Medication List This Visit: 1)  Proair Hfa 108 [phone] Base) Mcg/act Aers (Albuterol sulfate) .... 2 puffs four times daily as needed   PT SEEN BY TP, Nasean Zapf CALLED AWAY FOR EMERGENCY  Leslye Peer MD  February 24, 2010 12:05 PM  SEE NEW OV NOTE UNDER TAMMY PARRETT

## 2010-03-18 NOTE — Letter (Signed)
Summary: Alpha 1 Test Results<<<normal  External Correspondence   Imported By: Lehman Prom 03/10/2010 11:43:35  _____________________________________________________________________  External Attachment:    Type:   Image     Comment:   External Document  Appended Document: Alpha 1 Test Results<<<normal Pt is aware test results are normal per Dr. Delton Coombes.

## 2010-04-01 NOTE — Letter (Signed)
Summary: [facility]   Imported By: Sherian Rein 03/23/2010 11:18:53  _____________________________________________________________________  External Attachment:    Type:   Image     Comment:   External Document

## 2010-04-07 NOTE — Letter (Signed)
Summary: [facility]   Imported By: Sherian Rein 04/01/2010 10:50:06  _____________________________________________________________________  External Attachment:    Type:   Image     Comment:   External Document

## 2010-04-07 NOTE — Letter (Signed)
Summary: [facility]  Medical Hx/[facility]   Imported By: Sherian Rein 04/01/2010 10:51:14  _____________________________________________________________________  External Attachment:    Type:   Image     Comment:   External Document

## 2010-05-15 LAB — CBC
HCT: 45.3 % (ref 39.0–52.0)
Hemoglobin: 15.6 g/dL (ref 13.0–17.0)
MCHC: 34.4 g/dL (ref 30.0–36.0)
MCV: 95.1 fL (ref 78.0–100.0)
Platelets: 216 10*3/uL (ref 150–400)
RBC: 4.76 MIL/uL (ref 4.22–5.81)
RDW: 13.1 % (ref 11.5–15.5)
WBC: 8.7 10*3/uL (ref 4.0–10.5)

## 2010-06-26 NOTE — Op Note (Signed)
NAME:  Andrew [name], SINNING NO.:  [id number]   MEDICAL RECORD NO.:  [id number]          PATIENT TYPE:  AMB   LOCATION:  DAY                          FACILITY:  [facility]   PHYSICIAN:  Marlowe Kays, M.D.  DATE OF BIRTH:  01-Jun-1950   DATE OF PROCEDURE:  09/24/2005  DATE OF DISCHARGE:                                 OPERATIVE REPORT   PREOPERATIVE DIAGNOSIS:  Persistent left sciatica felt secondary to  spondylosis with nerve root compression at L2-3 and L5-S1, left.   POSTOPERATIVE DIAGNOSIS:  Persistent left sciatica felt secondary to  spondylosis with nerve root compression at L2-3 and L5-S1, left.   OPERATION:  1. Repeat decompressive laminectomy with foraminotomy at L5-S1, left.  2. Total laminectomy of L3, left, with decompression of L3 and L4 nerve      roots.   SURGEON:  Marlowe Kays, M.D.   ASSISTANT:  Georges Lynch. Darrelyn Hillock, M.D.   ANESTHESIA:  General.   PATHOLOGY AND JUSTIFICATION FOR PROCEDURE:  He has had a history of surgery  at L3-4, L4-5 and L5-S1 on the right, and also central and lateral recess  decompression at L5-S1 on the left.  He has had no prior surgery at the  upper levels on the left.  He has had persistent left sciatic-type pain.  Workup has been aggressive with the major positive findings appearing to be  foraminal compression at L5-S1 on the left and foraminal compression at L2-3  on the left.  He and his family understand that we are working on the best  information that we can obtain through all exhaustive studies and that there  are no guarantees that this will completely relieve his symptomatology.  He  has a total hip and a total knee on this left side as well and there was no  evidence that these are involved in his pain pattern.   PROCEDURE:  After prophylactic antibiotics and satisfied general anesthesia,  he was carefully placed in a prone position on the Wilson frame, protecting  his left hip and knee.  Back was prepped with  DuraPrep and with 2 spinal  needles distally and 2 spinal needles proximally, I took a lateral x-ray,  tentatively localizing the 2 areas for surgery.  I then completed draping  the back in a sterile field.  I first made a midline incision at what we  felt was L5-S1 and then also at what we felt was L2-3.  The spinous  processes at each level were tagged with Kocher clamps and lateral x-rays  were taken, demonstrating that we were on L5-S1 and L2-3.  Based on this, I  then began removing soft tissue off the sacrum and the lamina of L5-S1 on  the left.  He had a good bit of residual scar.  A self-retaining retractor  was brought in and followed by the microscope.  With a combination of a  small curette and 2- to 3-mm Kerrison rongeurs, I removed the sacrum,  lateral recess and inferior portion of the residual lamina of L5, performing  a wide foraminotomy, identifying the S1 nerve root and  removing some the  scar.  When we both felt comfortable that we had thoroughly decompressed the  S1 nerve root and the foramen, I moved up to what we felt was L2-3 and again  removed soft tissue off the lamina of L2 and what we felt was L3, and placed  a self-retaining McCullough retractor.  He was extremely tight at this level  with overlapping of the lamina.  With a combination of double-action  rongeur, curette and 2- and 3-mm Kerrison rongeurs, we then decompressed  this interspace and the foramen.  Again, he had significant compression.  I  then took a third x-ray, a lateral x-ray, with a hockey-stick in what we  thought was the L2-3 foramen and with a Penfield at L5-S1.  The lateral x-  ray confirmed that the L5-S1 interspace was correct, but the hockey-stick,  despite the wide decompression, was in the L3-4 foramen.  Clinically, this  was pathologic, however, and I then redirected the scope superiorly and  removed the residual lamina of L3, working upward to L2.  We then removed  all ligament flavum  at the 2 levels and did a wide foraminotomy for the L3  nerve root as well, documenting its location with a fourth and final x-ray.  I then irrigated the wounds well with sterile saline and closed the fascia  with interrupted #1 Vicryl, subcutaneous tissue with 2-0 Vicryl and skin  with staples.  The subcutaneous tissue was infiltrated with 0.5% plain  Marcaine and he was given 30 mg of Toradol IV.  Blood loss was perhaps 200  mL, no blood replacement.  At the time of this dictation, he was on his way  to the recovery room in satisfactory condition with no known complication.           ______________________________  Marlowe Kays, M.D.     JA/MEDQ  D:  09/24/2005  T:  09/24/2005  Job:  [id number]

## 2010-06-26 NOTE — Op Note (Signed)
[place]. [facility]  Patient:    Andrew [name], Andrew [name]                        MRN: [id number] Proc. Date: 10/26/99 Adm. Date:  [id number] Attending:  Ollen Gross V                           Operative Report  PREOPERATIVE DIAGNOSIS:  Avascular necrosis/osteoarthritis left hip.  POSTOPERATIVE DIAGNOSIS:  Avascular necrosis/osteoarthritisleft hip.  OPERATION:  Left total hip arthroplasty.  SURGEON:  Trudee Grip, M.D.  ASSISTANT:  Ralene Bathe, P.A.  ANESTHESIA:  General  ESTIMATED BLOOD LOSS: 650  DRAINS: Hemovac times one.  COMPLICATIONS:  None.  CONDITION:  Stable to recovery.  BRIEF CLINICAL NOTE:  Mr. Stanislawski is a 60 year old male with a history of severe bilateral lower extremity pain, left greater than right.  He has had knee surgery previously which did not relieve his knee pain.  He has a markedly altered gait and preoperative radiographs of both hips show severe arthritic spurring with avascular necrosis changes on the left.He has good pain relief of his leg discomfort on the left with an intra-articular injection. He presents now for left total hip arthroplasty.  DESCRIPTION OF PROCEDURE:  After the successful administration of general anesthetic the patient was placed in the right lateral decubitus position with the left side up and held with the hip positioner.  Left lower extremity was isolated with plastic drapes and prepped and draped in the usual sterile fashion.  A standard posterior lateral incision was made.  Skin cut with a 10 blade through subcutaneous tissue to the level of the fascia lata which was incised in line with the skin incision.  Fibers of the gluteal maximus were also splint in line with the incision.  The short external rotators were isolated off the femur and then a capsulectomy performed.  The hip is then dislocated.  He did have some flattening of the femoral head and a lot of spurring at the articular margins.  He  had center of the femoral head marked and a trial prosthesis placed such that thecenter of the trial had correspondence to the center of his native head and the osteotomy line was marked with an oscillating saw.  The femoral neck cut into that knee.  The femur was then retracted anteriorly and the capsule removed anteriorly. Acetabular exposure was obtained and then the remainder of the labrum and capsule were removed.  The reaming is started with a 47 all the way up to increments of 255.  A 56 mm Duralock sector, impacted into the acetabulum in approximately 40 degrees of abduction,20 degrees of forward flexion matching its native anteversion.  It was then transfixed with two dome screws.  A trial 28 mm 10 degree liner is placed.  Femoral preparations initiated.  Starter reamer used and thenaxial reaming is performed all the way up to 17.5 mm.  Proximal up to a 22 F and then the sleeve is reamed up to a large.  A trial 22 large sleeve is impacted into the proximal femur and then a 22 x 17 stem with a 36 +8 neck is placed and a 28 +0 head.  This was difficult to reduce because of the 10 degree liner. It was then changed out to a neutral liner reduction form.  The patient had great stability at 70 degrees of flexion. He had 40 degrees of  adduction and 80 degrees internal rotation.  At 90 degrees of flexion he had 70 degrees internal rotation, and then had full extension and full external rotation.  At this point, the trial components were removed and the Apex hole eliminator was placed in the acetabular shell.  The permanent 28 mm neutral marathon liner was then impacted into theacetabular shell.   The permanent 43 F large sleeve was impacted into the femur and then the 22 x 17 stem with 36 +8 is placed.  A permanent 28 +0 head is placed.  Reduction is performed with the same stability parameters.  The wound was copiously irrigated with antibiotic solution and the short external rotator was  reattached to the femur through drill holes.  The fascia lata and the fascia of the gluteus maximus then closed over one limb of the Hemovac drain with interrupted #1 Vicryl. Subcutaneous wasclosed in two layers of interrupted 0 and then 2-0 Vicryl. Subcuticularwas closed with a running 3-0 Monocryl.  Incision was cleaned and dried and Steri-Strips and bulky sterile dressing applied.  The patient was then awakened and transported to recovery in stable condition. DD:  10/26/99 TD:  10/27/99 Job: [id number]

## 2010-06-26 NOTE — Discharge Summary (Signed)
[place]. [facility]  Patient:    Andrew [name], Andrew [name]                        MRN: [id number] Adm. Date:  [id number] Disc. Date: [id number] Attending:  Loanne Drilling Dictator:   Grayland Jack, P.A.                           Discharge Summary  ADMITTING DIAGNOSES: 1. Osteoarthritis of the left hip. 2. History of asthma. 3. History of hiatal hernia with gastroesophageal reflux disease.  DISCHARGE DIAGNOSES: 1. Left total hip arthroplasty. 2. Osteoarthritis. 3. Asthma. 4. Gastroesophageal reflux disease. 5. Hiatal hernia.  PROCEDURE:  Left total hip arthroplasty.  Surgeon: Trudee Grip, M.D. Assistant: Ralene Bathe, PA-C.  CONSULTATIONS:  None.  HISTORY OF PRESENT ILLNESS:  Patient is a 60 year old male with a long-standing history of bilateral hip pain that has been going on for several years now.  He also complains of pain in bilateral knees.  He has been tried on numerous conservative treatments including oral anti-inflammatories and activity modification as well as intra-articular steroid injections. Radiographs show flattening of the femoral head with cystic changes with possible avascular necrosis.  It was felt that due to the patients positive findings on x-rays and continued complaints of pain despite numerous conservative treatments that he would best benefit from surgical intervention at this time.  The risks, benefits, and complications of surgery were discussed patient in detail and he agreed to proceed.  HOSPITAL COURSE:  On October 26, 1999, the patient was admitted to [facility] and underwent the above-stated procedure which he tolerated well without complications.  Postoperatively, physical therapy was initiated to assist the patient with transfers and ambulation per total hip protocol. Patient was placed on touch-down weightbearing status.  Coumadin therapy was initiated postoperatively for DVT prophylaxis.  Patients  postoperative course was uncomplicated and patient did extremely well with physical therapy.  On October 30, 1999, the patient was without complaints of pain.  He had stated that he had some soreness.  Upon exam, he was afebrile and his vital signs were stable.  The incision site was clean and dry, with no erythema or discharge.  He was neurovascularly intact in the left lower extremity.  It was felt that the patient was stable for discharge to home at this time.  He will continue at home with home health physical therapy and home health nursing for blood draws while on Coumadin therapy.  LABORATORY DATA:  Preoperative urinalysis was within normal limits.  Follow-up urinalysis on October 28, 1999 remained within normal limits.  Preoperative chemistries were within normal limits.  Postoperatively, patients sodium was slightly decreased at 130 but was within normal limits prior to discharge. Patient had a slightly elevated glucose postoperatively at 155, however this was slightly improved prior to discharge at 123.  PT/INR/PTT prior to admission were within normal limits.  The patient was placed on Coumadin therapy per pharmacy protocol and these values were followed by pharmacy. Prior to discharge, the patients PT was 19.9 and INR was 2.3.  CBC on admission was within normal limits.  Patient did have an increase in white blood cells postoperatively at 18.5, however this was decreasing prior to discharge and was 14.7.  Hemoglobin and hematocrit remained relatively stable throughout hospital course.  Prior to discharge, patients hemoglobin was 9.1, hematocrit 32.6.  RADIOLOGY:  Preoperative films of the chest show  some opacity in the right middle lobe most likely a presenting atelectasis.  Follow-up chest x-ray on October 28, 1999 showed increased right lower lobe atelectasis.  Preoperative films of the left hip again showed cystic changes with questionable avascular necrosis.   Postoperative films showed satisfactory appearance of left total hip replacement.  Cardiology preoperative EKG was normal sinus rhythm.  CONDITION ON DISCHARGE:  Improved and stable.  DISCHARGE PLANS:  Patient is being discharged to home.  Again, he will have home health physical therapy per total hip protocol and home health nursing for blood draws while on Coumadin therapy.  He may shower on postop day #5. He is to keep the incision site clean and dry with daily dry dressing changes. He may resume home diet and home medications.  DISCHARGE MEDICATIONS: 1. Percocet 5 mg one to two tabs p.o. q.6h. p.r.n. pain. 2. Robaxin 500 mg one tab p.o. q.8h. p.r.n. spasm. 3. Coumadin per pharmacy protocol.  FOLLOW-UP:  Patient will need to follow up in the office with Dr. Despina Hick in approximately 10 days.  Please call [phone] for an appointment. DD:  10/30/99 TD:  11/01/99 Job: [id number]

## 2010-06-26 NOTE — Op Note (Signed)
NAME:  Andrew [name], Andrew [name] NO.:  [id number]   MEDICAL RECORD NO.:  [id number]          PATIENT TYPE:  AMB   LOCATION:  DAY                          FACILITY:  [facility]   PHYSICIAN:  Marlowe Kays, M.D.  DATE OF BIRTH:  [birth year]   DATE OF PROCEDURE:  11/19/2003  DATE OF DISCHARGE:                                 OPERATIVE REPORT   PREOPERATIVE DIAGNOSES:  1.  Foraminal and extra-foraminal disk herniation, L3-4, right, with      impingement of L3 nerve root.  2.  Right posterior central and posterolateral disk protrusion, L4-5, right.  3.  Left lateral disk bulge with osteophyte formation and compression of L5      nerve root.   POSTOPERATIVE DIAGNOSES:  1.  Foraminal and extra-foraminal disk herniation, L3-4, right, with      impingement of L3 nerve root.  2.  Right posterior central and posterolateral disk protrusion, L4-5, right.  3.  Left lateral disk bulge with osteophyte formation and compression of L5      nerve root.   OPERATION:  1.  Microdiskectomy, L3-4, right, with decompression of L3 and L3-4 nerve      roots.  2.  Microdiskectomy, L4-5, right.  3.  Microdiskectomy and lateral recess and foraminal decompression, L5-S1,      left.   SURGEON:  Marlowe Kays, M.D.   ASSISTANT:  Kerrin Champagne, M.D.   ANESTHESIA:  General.   INDICATIONS FOR PROCEDURE:  Having bilateral leg pain, left greater than  right.  He has a total hip and total knee on the left, but these source have  been ruled out as the source of his left leg pain by one of my partners, Dr.  Despina Hick.  An MRI of August 17, 2003 has demonstrated the above findings.  He has  failed to respond to nonsurgical treatment and accordingly is here for the  above-mentioned surgery.   PROCEDURE:  Prophylactic antibiotics.  We performed his surgery on the  Wilson frame to protect his left hip and knee.  A Foley catheter inserted at  the discretion of anesthesia.  Placed in the prone position on the  Wilson  frame.  Duraprepped the back.  With two spinal needles and lateral x-ray, we  were able to tentatively localize the L4-5 and L5-S1 interspaces.  I then  continued draping the back in the sterile field.   Working first to the L5-S1 on the left, based on the initial x-ray, we  dissected the soft tissue off the lamina of L5 and the sacrum, which was  clearly identifiable.  Placed a self-retaining McCullough retractor.  I then  removed a portion of the inferior lamina of L5 with double-action rongeur  and undermined the superior portion of the sacrum with a small curette and 2  mm Kerrison rongeur.  I then began decompressing the meninges and performed  a wide foraminotomy.  At L5-S1, he had significant lateral recess stenosis,  as depicted on the MRI.  After performing wide decompression, we brought in  the microscope and completed the decompression and also identified the  L5-S1  disk, which I opened with a 15 knife blade, and with a combination of  Epstein curette, straight and angled, upbiting pituitary rongeurs and  removed as much disk material as we could from the interspace.  With a  hockey stick, both the L5-S1 nerve roots were well-decompressed.  The wound  was irrigated with sterile saline.  I placed Gelfoam over the defect in the  interspace and over the meninges.  Self-retaining retractor was removed.  There was no unusual bleeding.  I then moved to the right side and began  removing soft tissue off the lamina of L5, L4, and L3.  Two self-retaining  McCullough retractors were inserted.  Then began decompression as at L5-S1,  left, at both these levels and then brought in the microscope for the final  portion of the decompression.  At L4-5, the L5 nerve root was identified,  followed by the disk, which was opened in a cruciate fashion, and all disk  material obtainable was removed from the interspace.  The L5 nerve root was  then found to be well decompressed in the foramen.   Moving to L3-4, at the  end of the interspace and most of the disk material, as noted on the MRI,  was far lateral, which we were able to remove with a combination of straight  and angle upright pituitaries.  At the conclusion of the case, placed a  hockey stick in the L3-4 foramen superiorly and found it to be well  decompressed, and the L4 nerve root distally as well.  Numerous veins in the  L3-4 were coagulated with bipolar cautery, but the wound appeared to be dry  at closure, following insertion of Gelfoam.  Self-retaining retractors were  removed.  The fascia was reapproximated with interrupted #1 Vicryl.  Subcutaneous tissue with 2-0 Vicryl.  The skin with staples.  Betadine,  Adaptic, and dry sterile dressing were applied.  Foley was to be removed  when he was placed on his back.  He went to the recovery room in  satisfactory condition with no known complications.  Minimal blood loss with  no blood replacement.      JA/MEDQ  D:  11/19/2003  T:  11/19/2003  Job:  [id number]

## 2010-06-26 NOTE — Op Note (Signed)
[facility]  Patient:    [name], Andrew.                     MRN: [id number] Proc. Date: 04/08/00 Attending:  Trudee Grip, M.D.                           Operative Report  PREOPERATIVE DIAGNOSIS:  Hematoma, left knee.  POSTOPERATIVE DIAGNOSIS:  Hematoma, left knee.  PROCEDURE:  Arthroscopic hematoma evacuation/incision and drainage, left knee.  SURGEON:  Dr. Lequita Halt.  ASSISTANT:  None.  ANESTHESIA:  General LMA.  ESTIMATED BLOOD LOSS:  Minimal.  DRAINS:  Hemovac x 1.  COMPLICATIONS:  None.  CONDITION:  Stable to recovery.  BRIEF CLINICAL NOTE:  [name] is a 60 year old male who underwent a left total knee arthroplasty three weeks ago with excellent initial result. He subsequently got over anticoagulated on his Coumadin and developed a hematoma. He has had significant pain with this and limited motion. He presents now for attempt at hematoma evacuation arthroscopically.  DESCRIPTION OF PROCEDURE:  After successful administration of general anesthetic via LMA, the patient had a tourniquet placed high on his left thigh and left lower extremity prepped and draped in the usual sterile fashion. The standard superomedial inferolateral arthroscopic portal incision was made with an 11 blade and then inflow passed superomedially. The camera and cannula are passed inferolaterally. The inflow was placed and large amounts of clot are exiting through the other cannula. The camera was placed into the joint and showed that there was a large amount of hematoma. A third port was created inferomedially and a shaver passed through there. Utilizing suction via the shaver, the hematoma is debrided back to normal appearing tissue. The amount of swelling in the knee decreased dramatically after this. Approximately four liters of fluid are run through the knee. The equipment is then removed from the inferior portals which are closed with interrupted 4-0 nylon.  A Hemovac drain is then threaded through the inflow cannula and the inflow cannula removed. The drain is subsequently sewn in. The knee is then placed through a range of motion and easily achieved five degrees down to 110 degrees without any force. At this point, a bulky sterile dressing is applied and the patient awakened and transported to recover in stable condition. DD:  04/08/00 TD:  04/11/00 Job: [id number]

## 2010-06-26 NOTE — Op Note (Signed)
[facility]  Patient:    Andrew [name], Andrew [name] Visit Number: [id number] MRN: [id number]          Service Type: SUR Location: 4W [id number] Attending Physician:  Sherri Rad Dictated by:   Sherri Rad, M.D. Proc. Date: 02/02/01 Admit Date:  02/02/2001                             Operative Report  PREOPERATIVE DIAGNOSES: 1. Right tight gastrocnemius. 2. Right plantar wart times two. 3. Right great toe and second through fifth toes fungal nails/ingrown nails. 4. Right second and third metatarsalgia. 5. Right second through fifth fixed hammer toes.  POSTOPERATIVE DIAGNOSES: 1. Right tight gastrocnemius. 2. Right plantar wart times two. 3. Right great toe and second through fifth toes fungal nails/ingrown nails. 4. Right second and third metatarsalgia. 5. Right second through fifth fixed hammer toes.  OPERATION: 1. Right gastrocnemius ______. 2. Wide excision of plantar warts x 2. 3. Right great toe and second through fifth toes nail excision. 4. Right second through fifth toes MTP joint capsulotomies. 5. Right second through fifth toes proximal phalanx head resection. 6. Right second through fifth toes FDL to proximal phalanx transfers. 7. Right second through fifth toes EDB to EDL transfer. 8. Right second and third metatarsal shortening osteotomies.  ANESTHESIA:  General endotracheal tube.  SURGEON:  Sherri Rad, M.D.  ASSISTANT:  Alexzandrew L. Perkins, P.A.-C.  ESTIMATED BLOOD LOSS:  Minimal.  TOURNIQUET TIME:  108 minutes.  COMPLICATIONS:  None.  DISPOSITION:  Stable to the PR.  INDICATIONS:  A 60 year old gentleman who has had persistent plantar forefoot pain that has failed all conservative management and was consented for the above procedure.  All risks which include infection, nerve or vessel injury, persistent pain, worsening of pain, nonunion, malunion, hardware failure, hardware irritation, K-wire infection, potential  loss of toe, recurrence of deformity, stiffness were all explained.  Questions were answered.  DESCRIPTION OF PROCEDURE:  The patient was brought to the operating room and placed in the supine position.  After adequate general endotracheal tube anesthesia was administered as well as Ancef 1 g IV piggyback.  We started the procedure with a longitudinal incision over the medial aspect of the gastroc muscle tendinous junction.  Dissection was carried down through skin. Hemostasis was obtained.  The fascia was then opened in line with the incision.  Muscle tendinous junction of the gastrocnemius was identified.  The soft tissues on the posterior aspect of the gastroc tendon were then elevated which included the sural nerve which was identified and protected throughout the case.  The interval between the gastroc soleus was then identified and using Mayo scissors, the gastroc tendon was then released from medial to lateral.  This had an excellent release of the gastroc contraction.  Once this was done, the wound was copiously irrigated with normal saline.  The subcutaneous was closed with 3-0 Vicryl, the skin was closed with 4-0 Monocryl subcuticular stitch.  We then removed both plantar warts which were located on the plantar aspect of the foot in the forefoot region, more towards the arch area.  Both were removed sharply with the scalpel and sent to pathology.  The wounds here were closed with 4-0 nylon simple stitch.  We then removed all of the toenails of the great toe, and second through fifth lesser toes using a snap.  We elected not to an ablation, for we wanted to  allow the nails to grow back normally, and see if in fact they will recur in regards to not only the ingrown toenails, but also the fungal infection, and the patient did not want them ablated at this time.  We then gravity exsanguinated and with the assistance of the 6 inch Ace wrap exsanguinated the right lower extremity.   The tourniquet was elevated to 290 mmHg after incisions were marked out.  We then made a longitudinal incision over the dorsal aspect of the second toe and extended the incision between the second and third metatarsals.  Dissection was then carried down to the second metatarsal through the soft tissue through a separate exposure through the soft tissues.  Once the second metatarsal was identified, soft tissue was elevated both medially and laterally, dorsally and distally.  We then placed two 1 cm Hohmanns and a 2-0 mm drill was then placed just distal to the proposed osteotomy site.  Once this was done, it measured to be 14.  We then performed an osteotomy, approximately 3 mm with an oscillating sagittal saw.  Once this was done, we then placed a four-hole quarter tubular plate dorsally and a 2.7 mm screw was then placed in the distal most screw hole.  With the osteotomy held reduced, the second for the most proximal screw hole was then filled with a 2.7 mm screw after this was drilled, and then the remaining screw holes were filled with 2.7 mm screws, fully threaded cortical.  Through a separate exposure in the soft tissues, but through the same incision, the third metatarsal shortening osteotomy was made, and it was done very similar through the second.  Again, it was 3 mm of shortening.  We then performed a dorsal approach to the second toe.  Dissection was carried down to extensor tendon.  The extensor digitorum longus and brevis were separated longitudinally with the scalpel, and the extensor digitorum longus was then tenotomized proximal medially, and the extensor digitorum brevis was tenotomized distal laterally for lateral transfer.  The MTP joint capsulotomy was then performed through a dorsal approach.  The collaterals were also released both medially and laterally sharply with a 15 blade scalpel.  We then entered the PIP joint and the distal aspect of the proximal phalanx  was skeletonized and the proximal phalanx was then resected with a rongeur, leveling off the resection area.  We then found the FDL tendon through the  plantar plate of the PIP joint.  This was pulled through the plantar plate, a longitudinal incision and tenotomize as distal as possible.  We then made a 2.7 mm drill hole through the base of the proximal phalanx. Then using a 3-0 PDS suture, the FDL tendon was then pulled through the drill hole and transferred to the proximal phalanx.  Prior to fully pulling the tendon through the hole, a 4-5 double trocar K-wire was then placed through the middle and distal phalanx out the tip in an antegrade manner, and then with the PIP joint reduced, the ankle in neutral dorsiflexion, and the toe held in reduced position, both at the PIP and MTP joints, and tension on the FDL tendon through the drill hole, and the K-wire was then passed across the MTP joint.  This had excellent purchase in the metatarsal head area.  Thus, skin relieving incisions were made on either side of the K-wire distally, and the K-wire was bent 90 degrees, and the cap was applied after it was cut.  A similar procedure was  performed to the third, fourth, and fifth toes respectively through separate incisions dorsally.  At the end of the procedure, the tourniquet was deflated.  All toes pinked up nicely with bleeding at the tip.  The toes were in excellent position.  At the end of the procedure, the fat pad that was distalized was now reduced over the metatarsal head.  The metatarsal heads were not prominent to palpation and were symmetric visually as well and in the proper position.  We then obtained x-rays and three views of the right foot which showed proper position of fixation and K-wire placement.  The wounds were copiously irrigated with normal saline. The skin was closed with 4-0 nylon, __________ stitch for all wounds.  The toe still remained pink and viable.  Sterile  dressings were applied.  A Jones dressing was applied.  The patient went stable to PR. Dictated by:   Sherri Rad, M.D. Attending Physician:  Sherri Rad DD:  02/02/01 TD:  02/03/01 Job: [id number]

## 2010-06-26 NOTE — Discharge Summary (Signed)
[facility]  Patient:    MAJOR, SANTERRE                        MRN: [id number] Adm. Date:  [id number] Disc. Date: [id number] Attending:  Loanne Drilling Dictator:   Ralene Bathe, P.A.C. CC:         Charlaine Dalton. Sherene Sires, M.D. [facility]  Luisa Hart E. Delford Field, M.D. [facility]   Discharge Summary  DATE OF BIRTH:  May 20, 1950.  ADMISSION DIAGNOSES: 1. End-stage osteoarthritis of the left knee. 2. Osteoarthritis of right hip. 3. Chronic obstructive pulmonary disease/asthma. 4. Chronic sinusitis. 5. Hiatal hernia.  DISCHARGE DIAGNOSES: 1. End-stage osteoarthritis of the left knee. 2. Osteoarthritis of right hip. 3. Chronic obstructive pulmonary disease/asthma. 4. Chronic sinusitis. 5. Hiatal hernia. 6. Status post left total knee arthroplasty. 7. Status post right hip intra-articular injection of corticosteroids. 8. Postoperative leukocytosis resolved, no infectious source.  CONSULTS: 1. Charlcie Cradle Delford Field, M.D. 2. Charlaine Dalton. Sherene Sires, M.D.  BRIEF HISTORY:  Mr. Karner is a 60 year old male with significant osteoarthritis involving both knees.  He has also had previous left total hip arthroplasty.  He has had progressive knee osteoarthritis.  Actually symptomatic in his right hip as well.  At this point his left knee has been much more symptomatic.  The decision is made to undergo left total knee arthroplasty.  The patient was seen and evaluated by pulmonologist as he has significant history of COPD and asthma.  The patient also had preoperative bout of asthmatic bronchitis.  He did receive medical clearance and preceded with surgery as planned.  HOSPITAL COURSE:  The patient was admitted and underwent the above named procedure and tolerated this well.  At the same time, he had a right hip intra-articular injection with corticosteroids.  The patient was followed by his pulmonologist postoperatively.  Overall he remained hemodynamically as well as respiratory  stable.  He did have elevated leukocytosis with white count spiking to 25.  He was noted to be afebrile with no chest pain, shortness of breath, or cough.  Incision in the knee was dry and clean.  Chest x-ray and UA were obtained which were normal.  Daily CBCs, including white counts, were ordered.  These showed a reduction to 15.7 on March 21, 2000. No other source of infection was found.  His chest x-ray was clear.  At this time it was felt that it could have been secondary to recent treatment with prednisone tapers.  Pulmonology, on March 21, 2000, saw the patient and felt the patient was stable on current medications.  On March 22, 2000, the patient was afebrile, vital signs were stable, incision was clean and dry, he had no chest pain, no shortness of breath.  At this time he medically and orthopedically, was able to be discharged to home with outpatient PT, outpatient blood draws on Coumadin.  The patient was begun on Coumadin postoperatively for DVT and PE prophylaxis.  LABORATORY DATA:  Urinalysis negative. CBC with white count as stated above, 15.7 on March 21, 2000.  On March 20, 2000, it was 25.9; on March 19, 2000, it was 23.2; and preoperatively 10.6.  Hemoglobin 17.9 preoperatively, stable postoperatively at 10.6.  Differential on the day of discharge showed no shift on the day prior to discharge.  Protime is not on the chart.  Chemistries on admission showed sodium 133, postoperatively 129 to 126.  Treated with fluid restriction and follow-up BMET not on the chart.  Urinalysis on admission normal and on March 20, 2000, showed hyalin casts, otherwise normal.  Blood type is O negative.  RADIOLOGY:  A preoperative chest x-ray negative postoperatively with atelectasis.  CARDIOLOGY:  The EKG is not on the chart at the time of dictation.  CONDITION ON DISCHARGE:  Stable and improved.  DISCHARGE MEDICATIONS AND PLANS:  The patient is being discharged home.   He will follow up in our office two weeks postoperatively.  Will have biweekly protimes, INRs and fax to our office as there is no home physical therapy being arranged for this patient.  Trudee Grip, M.D., will manage his protimes and INRs himself.  Fax them to [phone].  He is given a prescription for outpatient PT.  Three times a week ____________ protocol.  Weightbearing as tolerated.  Prescriptions are given for: 1. Percocet 5 mg #40 one or two every four to six p.r.n. 2. Robaxin 500 mg one every eight p.r.n. spasm, one refill, #30. 3. Coumadin per pharmacy.  Also, prescription was given for his outpatient blood draws.  Outpatient PT.  The final BMETs and CBC are pending prior to discharge.  Will have these final results faxed to our office as well. DD:  03/22/00 TD:  03/22/00 Job: [id number]

## 2010-06-26 NOTE — Discharge Summary (Signed)
NAME:  Andrew [name], Andrew [name] NO.:  [id number]   MEDICAL RECORD NO.:  [id number]          PATIENT TYPE:  INP   LOCATION:  5154                         FACILITY:  [facility]   PHYSICIAN:  Suzanna Obey, M.D.       DATE OF BIRTH:  11/01/1950   DATE OF ADMISSION:  03/03/2007  DATE OF DISCHARGE:  03/07/2007                               DISCHARGE SUMMARY   ADMISSION DIAGNOSIS:  Facial cellulitis.   DISCHARGE DIAGNOSIS:  Facial cellulitis.   SURGICAL PROCEDURES:  None.   HISTORY OF PRESENT ILLNESS:  This is a 60 year old who developed a 1-day  history of facial cellulitis across his nose and the midface.  He was  placed on Bactrim, but the redness continued with increasing pain.  The  patient had a history of sinusitis, but this was by exam more a skin  cellulitis.  He had no exposure to MRSA and had not had that infection  previously.   PHYSICAL EXAMINATION:  HEENT:  Pupils are equal, round, and reactive to  light.  Eyes were normal.  The nose was without evidence of lesions  inside or swelling, but the nose on the surface of the tip and dorsum  were very red, and the cellulitis extended off toward the right of the  malar eminence area.  There did not seem to be any palpable abscess, and  those tissues were relatively soft.  Oral cavity/oropharynx - he had a  slight amount of erythema of his hard palate but no other lesions or  swelling.  NECK:  Without adenopathy.   HOSPITAL COURSE:  He was admitted and placed on intravenous antibiotics  and was placed on medications of vancomycin and Zosyn, suspecting  possible MRSA.  The patient was observed for 2 days in the hospital,  resulting in substantial improvement in his swelling, pain, and redness.  The patient was feeling excellent on March 07, 2007, and the  cellulitis had resolved.  The weeping areas were now crusted over and  dry.  He had no pain and no nasal symptoms or congestion.    He was discharged on doxycycline 100  mg b.i.d. and will follow up in 1  week or sooner if any of this recurs.           ______________________________  Suzanna Obey, M.D.     JB/MEDQ  D:  03/30/2007  T:  03/31/2007  Job:  [id number]

## 2010-06-26 NOTE — Op Note (Signed)
NAME:  Andrew [name], Andrew [name] NO.:  [id number]   MEDICAL RECORD NO.:  [id number]                   PATIENT TYPE:  AMB   LOCATION:  DSC                                  FACILITY:  [facility]   PHYSICIAN:  Sherri Rad, M.D.               DATE OF BIRTH:  Sep 03, 1950   DATE OF PROCEDURE:  10/24/2001  DATE OF DISCHARGE:                                 OPERATIVE REPORT   PREOPERATIVE DIAGNOSIS:  Intractable plantar keratoses underneath the right  third and fourth metatarsal heads.   POSTOPERATIVE DIAGNOSIS:  Intractable plantar keratoses underneath the right  third and fourth metatarsal heads.   OPERATIONS:  1. Excision, right plantar foot lesion.  2. Partial excision, plantar right third and fourth metatarsal heads.   ANESTHESIA:  General endotracheal tube.   SURGEON:  Sherri Rad, M.D.   ASSISTANT:  Shawna Orleans, PA   ESTIMATED BLOOD LOSS:  Minimal.   TOURNIQUET TIME:  Approximately 40 minutes.   COMPLICATIONS:  None.   DISPOSITION:  Stable to the PAR.   INDICATIONS FOR SURGERY:  This is a 60 year old gentleman who approximately  eight months ago underwent a right forefoot procedure for metatarsalgia.  Postoperatively he has had persistent intractable plantar keratoses  underneath the third and fourth metatarsal heads.  It was thought that he  had a prominent torsion to the plantar aspect of the metatarsal head that  was causing this.  He was consented for the above procedure.  All risks,  which include infection, neurovascular injury, persistent pain, worsening of  pain, and possible recurrence of the plantar lesion as well as toe deformity  were all explained; and, questions were encouraged and answered.   DESCRIPTION OF OPERATION:  The patient was brought to the operating room and  placed in the supine position.  After adequate general endotracheal tube  anesthesia was administered as well as Ancef 1 gram IV piggyback.  The right  lower extremity was then prepped and draped in a sterile manner with a  proximally placed thigh tourniquet.  Then the right lower extremity was  gravity exsanguinated and the tourniquet was elevated to 290 mmHg.   We started the procedure with excision of the plantar lesion.  The wound was  closed with a simple suture after it was irrigated.  We then, through the  previous dorsal incision over the third and fourth toes respectively,  dissected down to the extensor tendon.  Extensor tendon was retracted  medially and an incision was made down to the metatarsophalangeal joint  laterally.  The metatarsal head was then identified.  Collaterals were  released both medially and laterally.  A Hohmann retractor was then placed  inside the joint.  There was a very prominent portion to the third  metatarsal head.  Then with a sagittal saw this was then cut and removed  with the aid of synovectomy rongeur.  Once  this was removed the corners were  then rounded off.  This was then palpated with the Freer underneath and it  was flush without any prominence.   A similar procedure was then performed on the fourth toe through a separate  approach.   The wounds were copiously irrigated with normal saline.  Skin was closed  with 4-0 nylon suture.  Tourniquet was deflated at the time of closure and  hemostasis was obtained.  Toes were in excellent position.  There was no  evidence of deformity.  Sterile dressing was applied.  Compression dressing  was applied.  Hard sole shoe was applied.   The patient was stable to the PAR.                                                 Sherri Rad, M.D.    PAB/MEDQ  D:  10/24/2001  T:  10/24/2001  Job:  [id number]

## 2010-06-26 NOTE — Consult Note (Signed)
[place]  Patient:    Andrew [name], Andrew [name]                        MRN: [id number] Proc. Date: 03/18/00 Adm. Date:  [id number] Attending:  Loanne Drilling CC:         Trudee Grip, M.D.  Selinda Flavin, M.D.   Consultation Report  DATE OF BIRTH:  [birth year]  CHIEF COMPLAINT:  Dyspnea.  PRIMARY PHYSICIAN:  Dr. Selinda Flavin.  HISTORY:  This is a 60 year old, white male who quit smoking in 1997, and is felt to have mild COPD with a significant asthmatic component.  He was seen by Dr. Delford Field on March 14, 2000, with a FEV1 of 2.59 which was 67% predicted, with a ratio of 56% after a course of prednisone and was felt to be a reasonable risk for surgery and now is seen at the request of Dr. Despina Hick in the recovery room for a postoperative evaluation.  He has been requiring oxygen in the recovery room at 3 L to maintain saturations whereas he had 95% oxygen on room air documented prior to admission.  He states that at his baseline he is able to walk on a flat surface for extended periods, but "cannot get in a hurry or go up a flight of steps."  He also complains of a chronic cough that is mostly dry in nature, with no real sputum production. He denies any overt sinus or reflux complaints, and had been tried on anti-reflux medications in the past, with no apparent benefit.  He denies any history of chest pain, exertional or pleuritic in nature, or any significant leg swelling or DVT.  PAST MEDICAL HISTORY:  Status post previous left hip replacement, September 2001, and previous bilateral knee surgery.  Left carpal tunnel surgery.  Left shoulder surgery. Right foot surgery.  Right thumb surgery.  Chronic medical conditions include COPD, asthma, chronic sinusitis, hiatal hernia, osteoarthritis and tendonitis.  MEDICATIONS:  He is maintained on advair 250/50 one b.i.d.  His last course of steroids was completed on February 2, with marked improvement in  all of his symptoms as outlined above.  SOCIAL HISTORY:  He quit smoking in 1997.  He has worked for [employer] for many years, but denies any unusual environmental exposures there.  FAMILY HISTORY:  Positive for emphysema in his father who smoked, and also had heart disease.  His brother had esophageal cancer.  His sister had cervical cancer.  REVIEW OF SYSTEMS:  Review of systems was taken in detail and essentially negative except as noted above.  He does complain of chronic nasal congestion.  PHYSICAL EXAMINATION:  GENERAL:  This is a pleasant white male who actually appears a bit older than stated age, in no acute distress.  He does have somewhat of a congested cough but is not able to bring any mucous up to examine.  VITAL SIGNS:  He is afebrile and normal vital signs.  HEENT:  Remarkable for moderate nonspecific ___________.  Oropharynx reveals no excessive postnasal drainage or cobblestoning, but does clear his throat frequently during the interview and exam.  NECK:  Supple without cervical adenopathy or tenderness.  Trachea midline.  No thyromegaly.  LUNGS:  Lung fields reveal a few inspiratory and expiratory rhonchi bilaterally, but overall air movement is adequate.  HEART:  Regular rate and rhythm without murmur, gallop, or rub present.  CHEST:  Mildly barrel-shape, but Hoover sign is negative with normal  percussion.  ABDOMEN:  Soft, benign, with normal excursion.  EXTREMITIES:  Warm without calf tenderness, cyanosis, clubbing, or edema.  NEUROLOGIC:  No focal deficits or reflexes.  SKIN:  Warm and dry.  LABORATORY:  Chest x-ray is not available.  Hemoglobin saturations are in the mid-90s on 3 L.  IMPRESSION:  1. Chronic obstructive pulmonary disease with a significant asthmatic     component.  Status post recent "preoperative tune-up," with a maximum FEV1     of around 2.59.  I believe that this FEV1 is actually disproportionate to     his complaints of  dyspnea with any activity.  Indicate at least a     component of deconditioning or even a component of upper airway     obstruction (see below) since FEV1 of over 2 L should allow him more     exercise tolerance than he relates.  One of the possibilities here is that     he has a component of upper airway obstruction either being fuelled by     chronic rhinitis/sinusitis, reflux, or due to his inhalers.  My     recommendation would be to try him on Pulmicort while he is in the     hospital at 4 puffs b.i.d., and also continue to treat him vigorously     for reflux with Protonix, as well as obtain a sinus CT scan since I could     not find a recent one in our records.  2. Resting hypoxemia postoperatively, no unexpected in this setting.  It is     probably related to mucous retention and microatelectasis.  I simply     recommend incentive spirometry and wean oxygen as tolerated.  I do not     believe any more aggressive treatment of chronic obstructive pulmonary     disease is necessary at this point, but certainly might benefit from     p.r.n. albuterol and early mobilization. DD:  03/18/00 TD:  03/20/00 Job: [id number]

## 2010-06-26 NOTE — H&P (Signed)
[facility]  Patient:    Andrew [name], Andrew [name] Visit Number: [id number] MRN: [id number]          Service Type: Attending:  Sherri Rad, M.D. Dictated by:   Dorie Rank, P.A. Adm. Date:  02/02/01   CC:         Selinda Flavin, M.D., [place], [place]  Oley Balm. Sung Amabile, M.D. [facility]   History and Physical  CHIEF COMPLAINT:  Right foot pain.  HISTORY OF PRESENT ILLNESS:  Andrew [name] is a pleasant 60 year old male who has a long history of right forefoot metatarsalgia secondary to hammertoe formation with distal revision of fat pad, tight gastrocnemius, and ______ intractable calluses versus plantar warts.  He has failed conservative treatment, and the pain is interfering with his activities of daily living. Radiographs in the office on December 01, 2000, revealed the right toe with a Mortons type foot configuration with longer second and third metatarsals, early arthritic changes of the midfoot and forefoot.  It was thought he would benefit from undergoing surgery to his right foot.  The risks and benefits as well as the procedure were discussed with the patient, and he agreed to proceed.  MEDICATIONS: 1. Advair 250/50 twice a day. 2. Flonase 50 mcg 1-2 sprays each nostril q.d. 3. Singulair 10 mg 1 p.o. q.d. 4. Zyrtec 10 mg 1 p.o. q.d. 5. Celebrex 200 mg 1 p.o. q.d.  The patient was instructed to bring his Advair with him to the hospital, and discontinue the Celebrex three days prior to his surgery.  ALLERGIES:  No known drug allergies.  PAST SURGICAL HISTORY: 1. Bilateral knee arthroscopy in 1999. 2. Shoulder arthroscopy on the left in 1987. 3. Left total hip arthroplasty in 2001. 4. Left total knee arthroplasty in 2002.  PAST MEDICAL HISTORY: 1. Seasonal allergies. 2. Osteoarthritis. 3. Mild case of emphysema that is managed by Dr. Sung Amabile in [place], [place]. 4. History of pneumonia x 2. 5. History of hiatal hernia.  SOCIAL HISTORY:  The  patient is married.  He has four children.  He denies any alcohol or tobacco use.  FAMILY HISTORY:  Father deceased at age 44, history of cardiovascular disease and emphysema.  Mother living, age 70, healthy.  He had a brother who was deceased at age 29 from esophageal cancer.  REVIEW OF SYSTEMS:  GENERAL:  No fevers, chills, night sweats, or bleeding tendencies.  PULMONARY:  No shortness of breath, productive cough, or hemoptysis.  CARDIOVASCULAR:  No chest pain, angina, or orthopnea.  ENDOCRINE: No hyper- or hypothyroidism.  No history of diabetes mellitus.  NEUROLOGIC: No seizures, headaches, or paralysis.  GASTROINTESTINAL:  No nausea, vomiting, diarrhea, or constipation.  GENITOURINARY:  No hematuria, dysuria, or discharge.  PHYSICAL EXAMINATION:  GENERAL:  Alert and oriented 60 year old white male, well-developed, well-nourished.  CARDIOVASCULAR:  Pulse 80, respirations 20, blood pressure 120/80.  HEENT:  Head is atraumatic, normocephalic.  Oropharynx is clear.  NECK:  Supple.  Negative for carotid bruits bilaterally.  No lymphadenopathy to the cervical chain palpated on exam.  LUNGS:  Clear to auscultation bilaterally.  No wheezes, rales, or rhonchi.  BREASTS:  Not pertinent to present illness.  HEART:  S1, S2, regular in rate and rhythm.  No murmur, rub, or gallop.  ABDOMEN:  Soft and nontender.  Positive bowel sounds.  GENITOURINARY:  Not examined.  EXTREMITIES:  Known hammertoes second through fifth toes.  Plantar warts underneath the metatarsal fat pads centrally with exquisite tenderness.  Tight gastrocnemius was  noted on exam.  SKIN:  Intact.  No rashes or lesions other than what is discussed above in the extremity section.  LABORATORY DATA:  Preoperative laboratories and x-rays are pending at this time.  IMPRESSION: 1. Right forefoot metatarsalgia-type gastrocnemius. 2. Second through fifth hammertoes on the right. 3. Plantar warts.  PLAN:  The  patient is scheduled for a right ______ second through fifth metatarsophalangeal joint dorsal capsulotomy, proximal phalanx head resections, FDL to proximal phalanx transfer, EBL to EDL transfer, second metatarsal shortening osteotomy with possible third metatarsal shortening osteotomy, toenail removals to the right foot, with excision of plantar warts. Dictated by:   Dorie Rank, P.A. Attending:  Sherri Rad, M.D. DD:  01/27/01 TD:  01/28/01 Job: [id number]

## 2010-06-26 NOTE — Op Note (Signed)
[facility]  Patient:    Andrew [name], Andrew [name]                        MRN: [id number] Proc. Date: 03/18/00 Adm. Date:  [id number] Attending:  Ollen Gross V                           Operative Report  PREOPERATIVE DIAGNOSIS: Osteoarthritis of left knee.  POSTOPERATIVE DIAGNOSIS: Osteoarthritis of left knee.  OPERATION/PROCEDURE: Left total knee arthroplasty.  SURGEON: Trudee Grip, M.D.  ASSISTANT: Alexzandrew L. Perkins, P.A.-C.  ANESTHESIA: General.  ESTIMATED BLOOD LOSS: Minimal.  DRAINS: Hemovac drain x 1.  COMPLICATIONS: None.  TOURNIQUET TIME: Tourniquet was 64 minutes at 350 mmHg.  CONDITION: Stable, to recovery.  INDICATIONS FOR PROCEDURE: Andrew [name] is a 60 year old male who has had significant arthritic changes in both patellofemoral joints as well as having a left total hip arthroplasty done for degenerative change.  He still has severe pain in both knees.  [place]land Jack [place] knee arthroscopies with lateral releases approximately two years ago with no benefit.  Andrew [name] is at deceased at the age of stage now where the pain has been refractory to nonoperative management including injections.  He presents now for left patellofemoral replacement versus total knee arthroplasty depending on the appearance of his cartilage.  DESCRIPTION OF PROCEDURE: After successful administration of general anesthetic a tourniquet was placed high on the left thigh and the left lower extremity prepped and draped in the usual sterile fashion.  The extremity was then wrapped in an Esmarch and the knee flexed, and the tourniquet inflated to 350 mmHg.  A standard midline incision was made in the skin with a 10 blade and the incision taken down through the subcutaneous tissue to the level of the extensor mechanism.  A fresh blade was used to make a medial parapatellar arthrotomy.  The patella was everted and there were grade 4  changes throughout the entire patella, with patellar osteophyte formation.  The trochlea has grade 3 and 4 changes.  The knee was then flexed and a large 2 x 2 cm "divot" in the cartilage was noted, with significant softening and full-thickness delamination.  On the medial sides there was softening of the cartilage but no complete defects.  It was decided that given the involvement of the patella, trochlea, and lateral compartment it would be best to pursue total knee arthroplasty as opposed to an isolated unicompartmental patellofemoral replacement.  At this point the soft tissue over the proximal medial tibial was subperiosteally elevated to the joint line with a knife into semimembranous bursa with curved osteotome.  Soft tissue over the proximal lateral tissue was also elevated, with attention being paid to avoid any patellar tendon or tibial tubercle.  The patella was then everted and the knee flexed, and ACL and PCL removed.  The drill was then used to create a starting hole in the distal femur and the five degree left valgus alignment guide was placed.  Referencing off the posterior condyle rotation was marked in a block pin so as to remove nine mm off the distal femur.  Distal femoral resection was made with an oscillating saw.  A sizing block was placed and a size 5 was most appropriate.  The rotation was marked corresponding to the epicondylar axis and three degrees external rotation off the posterior condyles.  The anterior  and posterior cuts were then made with a size 5 block.  The tibial was subluxed forward and menisci removed.  The extramedullary tibial alignment guide was placed referencing proximally at the medial third of the tibial tubercle, distally along the second metatarsal axis and tibial crest.  The block was then pinned to remove 10 mm off the nondeficient lateral side. Tibial resection was then made with an oscillating saw.  A size five was the most appropriate  tibial component.  An intercondylar and chamfer block was placed on the distal femur and intercondylar and chamfer cuts made.  The size five trial posterior stabilized femur with a size five mobile bearing tibial platform was placed with a 10 mm posterior stabilizer rotating platform insert.  Full extension was achieved with excellent varus and valgus balance throughout full range of motion.  The patella was then everted and the space measured to be 24 mm, and free-hand resection taken down to 14 mm.  A 38 mm template was placed and the lug holes were then drilled.  The trial patella was placed and had excellent tracking.  Trial components were removed and then the proximal tibia was prepared with the modular drill and keyhole punch.  Osteophytes were then removed off the posterior condyles of the femur.  The cut bone surface was then repaired with pulsatile lavage and cement mix.  Once through with implantation the size five posterior stabilized femur size five mobile bearing tibial tray and 38 March patella were all cemented into place and the patella held with a clamp.  A 10 mm posterior stabilized insert was placed and the knee held in full extension, and extruded cement removed.  Once the cement hardened the trials were removed and a permanent 10 mm post stabilized rotating flap insert placed through the tibial tray.  The wound was then copiously irrigated with antibiotic solution and the extensor mechanism closed over one limb of the Hemovac drain with interrupted #1 PDS suture.  Total tourniquet time was 64 minutes.  The subcutaneous was closed with interrupted 2-0 Vicryl and subcuticular running 4-0 Monocryl.  The incision was clean and dry.  Steri-Strips and a bulky sterile dressing were applied.  The drain was then hooked to suction.  He was placed into a knee immobilizer and at this point the right groin was prepped and an 18 gauge spinal needle placed so as to enter the hip  joint using anatomic landmarks.  Once into the hip joint then a combination of lidocaine 3 cc, Marcaine 3 cc, and Depo-Medrol 1 cc were injected into the hip joint.  A  Band-Aid was then placed after removal of the needle.  At this point the patient was awakened and transported to the recovery room in stable condition. D:  03/18/00 TD:  03/20/00 Job: [id number]

## 2010-10-29 LAB — BASIC METABOLIC PANEL
BUN: 6
BUN: 9
CO2: 26
CO2: 29
Calcium: 8.8
Calcium: 9.1
Chloride: 100
Chloride: 105
Creatinine, Ser: 0.85
Creatinine, Ser: 0.93
GFR calc Af Amer: >60
GFR calc Af Amer: >60
GFR calc non Af Amer: >60
GFR calc non Af Amer: >60
Glucose, Bld: 123 — ABNORMAL HIGH
Glucose, Bld: 92
Potassium: 3.6
Potassium: 4
Sodium: 135
Sodium: 139

## 2010-10-29 LAB — CBC
HCT: 40.4
Hemoglobin: 14
MCHC: 34.7
MCV: 92.3
Platelets: 212
RBC: 4.38
RDW: 12.7
WBC: 8.7

## 2011-02-12 DIAGNOSIS — M1710 Unilateral primary osteoarthritis, unspecified knee: Secondary | ICD-10-CM | POA: Diagnosis not present

## 2011-02-12 DIAGNOSIS — IMO0002 Reserved for concepts with insufficient information to code with codable children: Secondary | ICD-10-CM | POA: Diagnosis not present

## 2011-02-15 DIAGNOSIS — J019 Acute sinusitis, unspecified: Secondary | ICD-10-CM | POA: Diagnosis not present

## 2011-03-04 DIAGNOSIS — IMO0002 Reserved for concepts with insufficient information to code with codable children: Secondary | ICD-10-CM | POA: Diagnosis not present

## 2011-03-04 DIAGNOSIS — M1710 Unilateral primary osteoarthritis, unspecified knee: Secondary | ICD-10-CM | POA: Diagnosis not present

## 2011-03-15 DIAGNOSIS — M7750 Other enthesopathy of unspecified foot: Secondary | ICD-10-CM | POA: Diagnosis not present

## 2011-03-31 DIAGNOSIS — M069 Rheumatoid arthritis, unspecified: Secondary | ICD-10-CM | POA: Diagnosis not present

## 2011-03-31 DIAGNOSIS — Z79899 Other long term (current) drug therapy: Secondary | ICD-10-CM | POA: Diagnosis not present

## 2011-03-31 DIAGNOSIS — M2550 Pain in unspecified joint: Secondary | ICD-10-CM | POA: Diagnosis not present

## 2011-03-31 DIAGNOSIS — M545 Low back pain, unspecified: Secondary | ICD-10-CM | POA: Diagnosis not present

## 2011-03-31 DIAGNOSIS — M1990 Unspecified osteoarthritis, unspecified site: Secondary | ICD-10-CM | POA: Diagnosis not present

## 2011-03-31 DIAGNOSIS — G894 Chronic pain syndrome: Secondary | ICD-10-CM | POA: Diagnosis not present

## 2011-04-01 DIAGNOSIS — M7750 Other enthesopathy of unspecified foot: Secondary | ICD-10-CM | POA: Diagnosis not present

## 2011-04-06 DIAGNOSIS — Z111 Encounter for screening for respiratory tuberculosis: Secondary | ICD-10-CM | POA: Diagnosis not present

## 2011-04-08 DIAGNOSIS — E039 Hypothyroidism, unspecified: Secondary | ICD-10-CM | POA: Diagnosis not present

## 2011-06-24 DIAGNOSIS — M545 Low back pain, unspecified: Secondary | ICD-10-CM | POA: Diagnosis not present

## 2011-06-24 DIAGNOSIS — M542 Cervicalgia: Secondary | ICD-10-CM | POA: Diagnosis not present

## 2011-06-29 DIAGNOSIS — M069 Rheumatoid arthritis, unspecified: Secondary | ICD-10-CM | POA: Diagnosis not present

## 2011-06-29 DIAGNOSIS — M2550 Pain in unspecified joint: Secondary | ICD-10-CM | POA: Diagnosis not present

## 2011-06-29 DIAGNOSIS — Z79899 Other long term (current) drug therapy: Secondary | ICD-10-CM | POA: Diagnosis not present

## 2011-06-29 DIAGNOSIS — M1990 Unspecified osteoarthritis, unspecified site: Secondary | ICD-10-CM | POA: Diagnosis not present

## 2011-07-07 DIAGNOSIS — R05 Cough: Secondary | ICD-10-CM | POA: Diagnosis not present

## 2011-07-07 DIAGNOSIS — R059 Cough, unspecified: Secondary | ICD-10-CM | POA: Diagnosis not present

## 2011-07-07 DIAGNOSIS — R5381 Other malaise: Secondary | ICD-10-CM | POA: Diagnosis not present

## 2011-07-07 DIAGNOSIS — R071 Chest pain on breathing: Secondary | ICD-10-CM | POA: Diagnosis not present

## 2011-08-25 DIAGNOSIS — L259 Unspecified contact dermatitis, unspecified cause: Secondary | ICD-10-CM | POA: Diagnosis not present

## 2011-08-31 DIAGNOSIS — J329 Chronic sinusitis, unspecified: Secondary | ICD-10-CM | POA: Diagnosis not present

## 2011-09-17 ENCOUNTER — Ambulatory Visit (INDEPENDENT_AMBULATORY_CARE_PROVIDER_SITE_OTHER): Payer: BC Managed Care – PPO | Admitting: Emergency Medicine

## 2011-09-17 ENCOUNTER — Encounter: Payer: Self-pay | Admitting: Emergency Medicine

## 2011-09-17 ENCOUNTER — Ambulatory Visit (INDEPENDENT_AMBULATORY_CARE_PROVIDER_SITE_OTHER)
Admission: RE | Admit: 2011-09-17 | Discharge: 2011-09-17 | Disposition: A | Discharge status: Home / Self Care | Payer: BC Managed Care – PPO | Source: Ambulatory Visit | Attending: Emergency Medicine | Admitting: Emergency Medicine

## 2011-09-17 VITALS — BP 134/80 | HR 70 | Temp 98.1°F | Ht 70.0 in | Wt 184.6 lb

## 2011-09-17 DIAGNOSIS — M069 Rheumatoid arthritis, unspecified: Secondary | ICD-10-CM

## 2011-09-17 DIAGNOSIS — J449 Chronic obstructive pulmonary disease, unspecified: Secondary | ICD-10-CM | POA: Diagnosis not present

## 2011-09-17 MED ORDER — BUDESONIDE-FORMOTEROL FUMARATE 160-4.5 MCG/ACT IN AERO: 2.0000 | INHALATION_SPRAY | Freq: Two times a day (BID) | RESPIRATORY_TRACT | Status: DC

## 2011-09-17 MED ORDER — ALBUTEROL SULFATE HFA 108 (90 BASE) MCG/ACT IN AERS: 2.0000 | INHALATION_SPRAY | RESPIRATORY_TRACT | Status: DC | PRN

## 2011-09-17 NOTE — Patient Instructions (Addendum)
We will start Symbicort 160/4.94mcg 2 puffs twice a day Use ventolin 2 puffs if needed for shortness of breath CXR today Walking oximetry today Follow with Dr Delton Coombes in 1 month

## 2011-09-17 NOTE — Assessment & Plan Note (Signed)
Follows with Dr Nickola Major - mtx + Chauncey Cruel

## 2011-09-17 NOTE — Assessment & Plan Note (Signed)
Suspect his SOB reflects untreated progression of COPD. Must also consider MTX/RA.  - CXR now to screen for ILD - restart BD's >> trial Symbicort + SABA prn.  - walking oximetry today.

## 2011-09-17 NOTE — Progress Notes (Signed)
History of Present Illness:   61 yo former smoker, hx of RA on chronic pred and MTX (started 2011). Childhood asthma that he grew out of. He has had trouble as an adult when he gets a URI. Has been freq treated with He has exertional dyspnea that is worse over about 2 months. Hears wheezing with exertion. As part eval had CXR, not available at this time. He tell me that the film was abnormal, not sure what it showed. Has seen Dr Sung Amabile before, has been on Advair, albuterol, maybe others.   ROV 01/09/10 -- dyspnea, hx tobacco, RA/MTX. Has been on Spiriva and Advair before per old chart in 05, 06. PFT today show moderate AFL, borderline BD response, no restriction. Normal DLCO. Has siblings w COPD.   ROV 09/17/11 -- 18 yo man, former tobacco, hx RA on MTX and humira. Last time 2001 we showed that he has moderate AFL on spiro. He tells me that he is having progressive SOB over many months time. We had planned to restart BD's last time. He isn't using anything right now. He didn't tolerate Spiriva or Advair - felt bad when he used them. He describes throat and esophageal pain that happens acutely and then resolves - ? Nutcracker esophagus.    Filed Vitals:   09/17/11 1607  BP: 134/80  Pulse: 70  Temp: 98.1 F (36.7 C)   Gen: Pleasant, well-nourished, in no distress,  normal affect  ENT: No lesions,  mouth clear,  oropharynx clear, no postnasal drip  Neck: No JVD, no TMG, no carotid bruits  Lungs: No use of accessory muscles, no wheeze on normal breath, mild exp wheeze on forced exp  Cardiovascular: RRR, heart sounds normal, no murmur or gallops, no peripheral edema  Musculoskeletal: No deformities, no cyanosis or clubbing  Neuro: alert, non focal  Skin: Warm, no lesions or rashes  COPD Suspect his SOB reflects untreated progression of COPD. Must also consider MTX/RA.  - CXR now to screen for ILD - restart BD's >> trial Symbicort + SABA prn.  - walking oximetry today.   ARTHRITIS,  RHEUMATOID Follows with Dr Nickola Major - mtx + Chauncey Cruel

## 2011-09-20 DIAGNOSIS — G894 Chronic pain syndrome: Secondary | ICD-10-CM | POA: Diagnosis not present

## 2011-09-20 DIAGNOSIS — M961 Postlaminectomy syndrome, not elsewhere classified: Secondary | ICD-10-CM | POA: Diagnosis not present

## 2011-09-20 DIAGNOSIS — M542 Cervicalgia: Secondary | ICD-10-CM | POA: Diagnosis not present

## 2011-09-22 ENCOUNTER — Telehealth: Payer: Self-pay | Admitting: Emergency Medicine

## 2011-09-22 NOTE — Telephone Encounter (Signed)
Called and spoke with patient regarding cxr results per Dr. Delton Coombes as listed below. Patient verbalized understanding and had no further question/concerns at this time.

## 2011-09-22 NOTE — Telephone Encounter (Signed)
I spoke with pt and he is requesting his CXR results from 09/17/11. I advised pt RB is currently not in the office. He would like a call back when he does return with his results. Please advise Dr. Delton Coombes thanks

## 2011-09-22 NOTE — Telephone Encounter (Signed)
Please tell the patient that his CXR was normal, in particular it did not show any evidence for scarring due to Rheumatoid or to methotrexate. Thanks.

## 2011-09-28 DIAGNOSIS — M069 Rheumatoid arthritis, unspecified: Secondary | ICD-10-CM | POA: Diagnosis not present

## 2011-09-28 DIAGNOSIS — G894 Chronic pain syndrome: Secondary | ICD-10-CM | POA: Diagnosis not present

## 2011-09-28 DIAGNOSIS — Z79899 Other long term (current) drug therapy: Secondary | ICD-10-CM | POA: Diagnosis not present

## 2011-09-28 DIAGNOSIS — M2550 Pain in unspecified joint: Secondary | ICD-10-CM | POA: Diagnosis not present

## 2011-09-28 DIAGNOSIS — M1990 Unspecified osteoarthritis, unspecified site: Secondary | ICD-10-CM | POA: Diagnosis not present

## 2011-10-18 ENCOUNTER — Encounter: Payer: Self-pay | Admitting: Emergency Medicine

## 2011-10-18 ENCOUNTER — Ambulatory Visit (INDEPENDENT_AMBULATORY_CARE_PROVIDER_SITE_OTHER): Payer: BC Managed Care – PPO | Admitting: Emergency Medicine

## 2011-10-18 VITALS — BP 126/80 | HR 98 | Temp 98.9°F | Ht 70.0 in | Wt 186.6 lb

## 2011-10-18 DIAGNOSIS — J449 Chronic obstructive pulmonary disease, unspecified: Secondary | ICD-10-CM | POA: Diagnosis not present

## 2011-10-18 DIAGNOSIS — Z23 Encounter for immunization: Secondary | ICD-10-CM

## 2011-10-18 DIAGNOSIS — J4489 Other specified chronic obstructive pulmonary disease: Secondary | ICD-10-CM | POA: Diagnosis not present

## 2011-10-18 NOTE — Patient Instructions (Addendum)
Please continue the Symbicort 2 puffs twice a day for another month. If you don't notice any benefit at that time, stop it and see if you miss it at all. Call and leave [place] a message to let use know if you stop the Symbicort Keep your albuterol available to use as needed Follow with Dr Delton Coombes in 6 months or sooner if you have any problems

## 2011-10-18 NOTE — Progress Notes (Signed)
History of Present Illness:   61 yo former smoker, hx of RA on chronic pred and MTX (started 2011). Childhood asthma that he grew out of. He has had trouble as an adult when he gets a URI. Has been freq treated with He has exertional dyspnea that is worse over about 2 months. Hears wheezing with exertion. As part eval had CXR, not available at this time. He tell me that the film was abnormal, not sure what it showed. Has seen Dr Sung Amabile before, has been on Advair, albuterol, maybe others.   ROV 01/09/10 -- dyspnea, hx tobacco, RA/MTX. Has been on Spiriva and Advair before per old chart in 05, 06. PFT today show moderate AFL, borderline BD response, no restriction. Normal DLCO. Has siblings w COPD.   ROV 09/17/11 -- 37 yo man, former tobacco, hx RA on MTX and humira. Last time 2011 we showed that he has moderate AFL on spiro. He tells me that he is having progressive SOB over many months time. We had planned to restart BD's last time. He isn't using anything right now. He didn't tolerate Spiriva or Advair - felt bad when he used them. He describes throat and esophageal pain that happens acutely and then resolves - ? Nutcracker esophagus.   ROV 10/18/11 -- 61 yo man, former tobacco, hx RA on MTX and humira.  Last visit we started Symbicort + SABA prn (had AFL on spiro 2011). No evidence ILD on CXR last time (for RA and MTX). He isn't sure that the Symbicort has helped him. He hasn't uses the albuterol   Filed Vitals:   10/18/11 1340  BP: 126/80  Pulse: 98  Temp: 98.9 F (37.2 C)   Gen: Pleasant, well-nourished, in no distress,  normal affect  ENT: No lesions,  mouth clear,  oropharynx clear, no postnasal drip  Neck: No JVD, no TMG, no carotid bruits  Lungs: No use of accessory muscles, no wheeze on normal breath, mild exp wheeze on forced exp  Cardiovascular: RRR, heart sounds normal, no murmur or gallops, no peripheral edema  Musculoskeletal: No deformities, no cyanosis or clubbing  Neuro:  alert, non focal  Skin: Warm, no lesions or rashes  COPD No evidence ILD on CXR Not clear that rx for AFL has helped him  Please continue the Symbicort 2 puffs twice a day for another month. If you don't notice any benefit at that time, stop it and see if you miss it at all. Call and leave [place] a message to let use know if you stop the Symbicort Keep your albuterol available to use as needed Follow with Dr Delton Coombes in 6 months or sooner if you have any problems

## 2011-10-18 NOTE — Assessment & Plan Note (Addendum)
No evidence ILD on CXR Not clear that rx for AFL has helped him  Please continue the Symbicort 2 puffs twice a day for another month. If you don't notice any benefit at that time, stop it and see if you miss it at all. Call and leave [place] a message to let use know if you stop the Symbicort Keep your albuterol available to use as needed Follow with Dr Delton Coombes in 6 months or sooner if you have any problems

## 2011-11-17 ENCOUNTER — Encounter (INDEPENDENT_AMBULATORY_CARE_PROVIDER_SITE_OTHER): Payer: Self-pay | Admitting: General Surgery

## 2011-11-17 ENCOUNTER — Encounter: Payer: Self-pay | Admitting: Internal Medicine

## 2011-11-18 ENCOUNTER — Encounter: Payer: Self-pay | Admitting: Internal Medicine

## 2011-11-19 ENCOUNTER — Ambulatory Visit (INDEPENDENT_AMBULATORY_CARE_PROVIDER_SITE_OTHER): Payer: BC Managed Care – PPO | Admitting: Internal Medicine

## 2011-11-19 ENCOUNTER — Telehealth: Payer: Self-pay | Admitting: Emergency Medicine

## 2011-11-19 ENCOUNTER — Encounter: Payer: Self-pay | Admitting: Internal Medicine

## 2011-11-19 VITALS — BP 168/82 | HR 99 | Ht 70.0 in | Wt 185.0 lb

## 2011-11-19 DIAGNOSIS — N400 Enlarged prostate without lower urinary tract symptoms: Secondary | ICD-10-CM

## 2011-11-19 DIAGNOSIS — R1314 Dysphagia, pharyngoesophageal phase: Secondary | ICD-10-CM

## 2011-11-19 DIAGNOSIS — Z1211 Encounter for screening for malignant neoplasm of colon: Secondary | ICD-10-CM

## 2011-11-19 DIAGNOSIS — R1310 Dysphagia, unspecified: Secondary | ICD-10-CM

## 2011-11-19 DIAGNOSIS — R1319 Other dysphagia: Secondary | ICD-10-CM

## 2011-11-19 MED ORDER — PEG-KCL-NACL-NASULF-NA ASC-C 100 G PO SOLR: 1.0000 | Freq: Once | ORAL | Status: DC

## 2011-11-19 NOTE — Progress Notes (Signed)
Patient ID: Andrew [name], male   DOB: 02/26/1950, 61 y.o.   MRN: [id number]  SUBJECTIVE: HPI Andrew [name] is a 61 year old male with a past medical history of rheumatoid arthritis who seen in consultation at the request of Dr. Neita Carp for evaluation of dysphagia.  The patient reports issues with solid and liquid dysphagia on a near daily basis over the past 6-12 months. These symptoms have seemed to worsen. It tends to be worse with solid food, but certainly can occur with liquids. He reports that he will eat feel like food sticks at the midesophagus and he will wait until the food passes. He denies regurgitation of food or food impaction. He also denies heartburn. He reports occasional nausea but no vomiting. He denies epigastric pain.  He does report a 10 pound weight loss over the last 1-2 years. He reports normal bowel pattern for him, but does report occasionally seeing dark stool. He also reports occasional scant red blood on the toilet tissue but no red blood in his stool. He denies diarrhea or constipation. No fevers or chills.  He has never had an endoscopy or colonoscopy  Review of Systems  As per history of present illness, otherwise negative   Past Medical History  Diagnosis Date  . Insomnia   . Osteoarthritis   . Rheumatoid arteritis   . Inguinal hernia   . Aneurysm     Current Outpatient Prescriptions  Medication Sig Dispense Refill  . Adalimumab (HUMIRA Reedy) Inject into the skin. 1 shot every 2 weeks      . folic acid (FOLVITE) 1 MG tablet Take 1 mg by mouth daily.      . hydroxychloroquine (PLAQUENIL) 200 MG tablet Take by mouth 2 (two) times daily. Unsure of dosage      . LEVOTHYROXINE SODIUM PO Take 0.75 mcg by mouth daily. Unsure of dosage      . methotrexate (RHEUMATREX) 2.5 MG tablet Take 2.5 mg by mouth once a week. Caution:Chemotherapy. Protect from light. 10 pills every Thursday      . morphine (MSIR) 30 MG tablet Take 30 mg by mouth 2 (two) times daily.       .  OXYCODONE HCL PO Take by mouth 3 (three) times daily as needed. Unsure of dose      . zolpidem (AMBIEN) 5 MG tablet Take 10 mg by mouth at bedtime as needed.       . peg 3350 powder (MOVIPREP) 100 G SOLR Take 1 kit (100 g total) by mouth once.  1 kit  0    Allergies  Allergen Reactions  . Penicillins     REACTION: nausea    Family History  Problem Relation Age of Onset  . Cancer Sister   . Multiple sclerosis Sister   . Esophageal cancer Brother   . COPD Sister   . COPD Brother   . Lung cancer Mother     History  Substance Use Topics  . Smoking status: Former Smoker -- 3.0 packs/day for 40 years    Types: Cigarettes    Quit date: 02/08/1997  . Smokeless tobacco: Never Used  . Alcohol Use: No    OBJECTIVE: BP 168/82  Pulse 99  Ht 5\' 10"  (1.778 m)  Wt 185 lb (83.915 kg)  BMI 26.54 kg/m2 Constitutional: Well-developed and well-nourished. No distress. HEENT: Normocephalic and atraumatic. Oropharynx is clear and moist. No oropharyngeal exudate. Conjunctivae are normal. Pupils are equal round and reactive to light. No scleral icterus. Neck: Neck supple.  Trachea midline. Cardiovascular: Normal rate, regular rhythm and intact distal pulses. No M/R/G Pulmonary/chest: Effort normal and coarse breath sounds bilaterally. No wheezing, rales. Abdominal: Soft, mild diffuse tenderness without rebound or guarding, nondistended. Bowel sounds active throughout. There are no masses palpable. No hepatosplenomegaly. Extremities: no clubbing, cyanosis, or edema Lymphadenopathy: No cervical adenopathy noted. Neurological: Alert and oriented to person place and time. Skin: Skin is warm and dry. No rashes noted. Psychiatric: Normal mood and affect. Behavior is normal.  ASSESSMENT AND PLAN:  60 year old male with a past medical history of rheumatoid arthritis who seen in consultation at the request of Dr. Neita Carp for evaluation of dysphagia.  1.  Esophageal dysphagia -- I recommended upper  endoscopy for further evaluation and to exclude esophagitis, stricture or mass lesion. We discussed how this may relate to esophageal dysmotility, but I first recommend EGD to rule out structural disease. I did discuss dilation with him including the risks and benefits and he is agreeable to proceed should dilation be necessary.  He is not having heartburn symptoms and therefore I will hold off on initiating PPI. Further recommendations be made after EGD.  2.   Colorectal cancer screening -- the patient is average risk and has never had screening colonoscopy. He has had scant rectal bleeding with wiping and this can be evaluated with screening colonoscopy as well. We discussed the test including the risks and benefits and he is agreeable to proceed. This will be performed at the same time as his EGD.  3.  BPH -- he was told by his primary doctor that his prostate was enlarged, but his labs were normal. I've seen this was a normal PSA. He does describe nocturia and slow stream and I have encouraged him to discuss BPH including medical therapy with his primary doctor. He voices understanding.

## 2011-11-19 NOTE — Patient Instructions (Addendum)
It sounds like you have recently been diagnosed with BPH (benign prostate enlargement) -- I encourage you to discuss medications for this condition with your primary doctor.  These medications can greatly improve this condition and help with your urine flow.  You have been scheduled for a colonoscopy/Endoscopy with propofol. Please follow written instructions given to you at your visit today.  Please pick up your prep kit at the pharmacy within the next 1-3 days. If you use inhalers (even only as needed), please bring them with you on the day of your procedure.  We have sent the following medications to your pharmacy for you to pick up at your convenience: Moviprep; you've were given instructions at your visit today.

## 2011-11-19 NOTE — Telephone Encounter (Signed)
Thank you :)

## 2011-11-19 NOTE — Telephone Encounter (Signed)
Per patient instructions pt was to try Symbicort x 1 month and if he did not see any difference then he was to stop it and see how he felt off of it. Pt states he took the med x 1 month without any difference so he stopped taking it. He will let us know if he feels any difference without it. I will forward as an FYI. Carron Curie, CMA

## 2011-11-29 ENCOUNTER — Ambulatory Visit (AMBULATORY_SURGERY_CENTER): Payer: BC Managed Care – PPO | Admitting: Internal Medicine

## 2011-11-29 ENCOUNTER — Encounter: Payer: Self-pay | Admitting: Internal Medicine

## 2011-11-29 VITALS — BP 116/77 | HR 74 | Temp 99.3°F | Resp 19 | Ht 70.0 in | Wt 185.0 lb

## 2011-11-29 DIAGNOSIS — Z1211 Encounter for screening for malignant neoplasm of colon: Secondary | ICD-10-CM

## 2011-11-29 DIAGNOSIS — K219 Gastro-esophageal reflux disease without esophagitis: Secondary | ICD-10-CM | POA: Diagnosis not present

## 2011-11-29 DIAGNOSIS — R1314 Dysphagia, pharyngoesophageal phase: Secondary | ICD-10-CM

## 2011-11-29 DIAGNOSIS — D131 Benign neoplasm of stomach: Secondary | ICD-10-CM | POA: Diagnosis not present

## 2011-11-29 MED ORDER — SODIUM CHLORIDE 0.9 % IV SOLN: 500.0000 mL | INTRAVENOUS | Status: DC

## 2011-11-29 MED ORDER — PANTOPRAZOLE SODIUM 40 MG PO TBEC: 40.0000 mg | DELAYED_RELEASE_TABLET | Freq: Every day | ORAL | Status: DC

## 2011-11-29 NOTE — Progress Notes (Signed)
Propofol per Knute Neu, CRNA, see scanned intra procedure report. All meds titrated per CRNA during procedure. ewm

## 2011-11-29 NOTE — Patient Instructions (Addendum)
YOU HAD AN ENDOSCOPIC PROCEDURE TODAY AT THE [facility]: Refer to the procedure report that was given to you for any specific questions about what was found during the examination.  If the procedure report does not answer your questions, please call your gastroenterologist to clarify.  If you requested that your care partner not be given the details of your procedure findings, then the procedure report has been included in a sealed envelope for you to review at your convenience later.  YOU SHOULD EXPECT: Some feelings of bloating in the abdomen. Passage of more gas than usual.  Walking can help get rid of the air that was put into your GI tract during the procedure and reduce the bloating. If you had a lower endoscopy (such as a colonoscopy or flexible sigmoidoscopy) you may notice spotting of blood in your stool or on the toilet paper. If you underwent a bowel prep for your procedure, then you may not have a normal bowel movement for a few days.  DIET: FOLLOW DILATION- SEE HANDOUT.  Drink plenty of fluids but you should avoid alcoholic beverages for 24 hours.  ACTIVITY: Your care partner should take you home directly after the procedure.  You should plan to take it easy, moving slowly for the rest of the day.  You can resume normal activity the day after the procedure however you should NOT DRIVE or use heavy machinery for 24 hours (because of the sedation medicines used during the test).    SYMPTOMS TO REPORT IMMEDIATELY: A gastroenterologist can be reached at any hour.  During normal business hours, 8:30 AM to 5:00 PM Monday through Friday, call [phone].  After hours and on weekends, please call the GI answering service at [phone] who will take a message and have the physician on call contact you.   Following lower endoscopy (colonoscopy or flexible sigmoidoscopy):  Excessive amounts of blood in the stool  Significant tenderness or worsening of abdominal pains  Swelling  of the abdomen that is new, acute  Fever of 100F or higher  Following upper endoscopy (EGD)  Vomiting of blood or coffee ground material  New chest pain or pain under the shoulder blades  Painful or persistently difficult swallowing  New shortness of breath  Fever of 100F or higher  Black, tarry-looking stools  FOLLOW UP: If any biopsies were taken you will be contacted by phone or by letter within the next 1-3 weeks.  Call your gastroenterologist if you have not heard about the biopsies in 3 weeks.  Our staff will call the home number listed on your records the next business day following your procedure to check on you and address any questions or concerns that you may have at that time regarding the information given to you following your procedure. This is a courtesy call and so if there is no answer at the home number and we have not heard from you through the emergency physician on call, we will assume that you have returned to your regular daily activities without incident.  SIGNATURES/CONFIDENTIALITY: You and/or your care partner have signed paperwork which will be entered into your electronic medical record.  These signatures attest to the fact that that the information above on your After Visit Summary has been reviewed and is understood.  Full responsibility of the confidentiality of this discharge information lies with you and/or your care-partner.   Follow dilation diet today- see handout  Follow up colonoscopy in 10 years  Continue your current  medications  Call office tomorrow to set up a follow up appointment for 4-6 weeks

## 2011-11-29 NOTE — Op Note (Signed)
[facility] [address] [place], [address]   COLONOSCOPY PROCEDURE REPORT  PATIENT: Andrew, [name]  MR#: [id number] BIRTHDATE: Sep 14, 1950 , 61  yrs. old GENDER: Male ENDOSCOPIST: Beverley Fiedler, MD PROCEDURE DATE:  11/29/2011 PROCEDURE:   Colonoscopy, screening ASA CLASS:   Class II INDICATIONS:average risk screening and first colonoscopy. MEDICATIONS: MAC sedation, administered by CRNA and Propofol (Diprivan) 170 mg IV  DESCRIPTION OF PROCEDURE:   After the risks benefits and alternatives of the procedure were thoroughly explained, informed consent was obtained.  A digital rectal exam revealed no rectal mass.   The [facility] CF-H180AL [id number]  endoscope was introduced through the anus and advanced to the cecum, which was identified by both the appendix and ileocecal valve. No adverse events experienced. The quality of the prep was Suprep good  The instrument was then slowly withdrawn as the colon was fully examined.      COLON FINDINGS: A normal appearing cecum, ileocecal valve, and appendiceal orifice were identified.  The ascending, hepatic flexure, transverse, splenic flexure, descending, sigmoid colon and rectum appeared unremarkable.  No polyps or cancers were seen. Small internal hemorrhoids were found.  Retroflexed views revealed internal hemorrhoids. The time to cecum=3 minutes 52 seconds. Withdrawal time=7 minutes 58 seconds.  The scope was withdrawn and the procedure completed.  COMPLICATIONS: There were no complications.  ENDOSCOPIC IMPRESSION: 1.   Normal colon 2.   Small internal hemorrhoids  RECOMMENDATIONS: You should continue to follow colorectal cancer screening guidelines for "routine risk" patients with a repeat colonoscopy in 10 years. There is no need for FOBT (stool) testing for at least 5 years.   eSigned:  Beverley Fiedler, MD 11/29/2011 2:12 PM   cc: Fara Chute, MD and The Patient

## 2011-11-29 NOTE — Progress Notes (Signed)
Pt tolerated both procedures well. ewm

## 2011-11-29 NOTE — Progress Notes (Signed)
Patient did not experience any of the following events: a burn prior to discharge; a fall within the facility; wrong site/side/patient/procedure/implant event; or a hospital transfer or hospital admission upon discharge from the facility. (G8907) Patient did not have preoperative order for IV antibiotic SSI prophylaxis. (G8918)  

## 2011-11-29 NOTE — Op Note (Signed)
[facility] [address] [place], [address]   ENDOSCOPY PROCEDURE REPORT  PATIENT: Andrew [name], Andrew [name]  MR#: [id number] BIRTHDATE: 1950/03/08 , 61  yrs. old GENDER: Male ENDOSCOPIST: Beverley Fiedler, MD REFERRED BY:  Fara Chute PROCEDURE DATE:  11/29/2011 PROCEDURE:  EGD w/ biopsy and Savary dilation of esophagus ASA CLASS:     Class II INDICATIONS:  heartburn.   dysphagia. MEDICATIONS: MAC sedation, administered by CRNA and propofol (Diprivan) 200mg  IV TOPICAL ANESTHETIC: Cetacaine Spray  DESCRIPTION OF PROCEDURE: After the risks benefits and alternatives of the procedure were thoroughly explained, informed consent was obtained.  The [facility] GIF-H180 [id number] endoscope was introduced through the mouth and advanced to the second portion of the duodenum. Without limitations.  The instrument was slowly withdrawn as the mucosa was fully examined.   ESOPHAGUS: The mucosa of the esophagus appeared normal.  Multiple biopsies were taken in the distal and mid esophagus to rule out eosinophilic esophagitis.    Empiric dilation performed using Savory dilator over a wire,  one pass 18 mm.  STOMACH: A hiatus hernia was found.   Mild gastritis (inflammation) was found in the gastric antrum.  Biopsies were taken in the antrum, body and angularis.  DUODENUM: Mild duodenal inflammation was found in the duodenal bulb. The duodenal mucosa showed no abnormalities in the 2nd part of the duodenum. Retroflexed views revealed a hiatal hernia.     The scope was then withdrawn from the patient and the procedure completed.  COMPLICATIONS: There were no complications.  ENDOSCOPIC IMPRESSION: 1.   The mucosa of the esophagus appeared normal; multiple biopsies were taken in the distal and mid esophagus to rule out eosinophilic esophagitis.  Empiric dilation to 18 mm 2.   Hiatus hernia was found 3.   Gastritis (inflammation) was found in the gastric antrum; biopsies were taken in the  antrum and angularis 4.   Duodenal inflammation was found in the duodenal bulb 5.   The duodenal mucosa showed no abnormalities in the 2nd part of the duodenum      RECOMMENDATIONS: 1.  Await pathology results 2.  Continue current medications 3.  Trial of pantoprazole 40 mg once daily for acid suppression. 4.  Office follow-up in 4-6 weeks.   eSigned:  Beverley Fiedler, MD 11/29/2011 2:09 PM   XB:JYNW Neita Carp, MD and The Patient  PATIENT NAME:  Andrew [name], Andrew [name] MR#: [id number]

## 2011-11-30 ENCOUNTER — Other Ambulatory Visit: Payer: Self-pay | Admitting: *Deleted

## 2011-11-30 ENCOUNTER — Telehealth: Payer: Self-pay

## 2011-11-30 NOTE — Telephone Encounter (Signed)
  Follow up Call-  Call back number 11/29/2011  Post procedure Call Back phone  # [phone]  Permission to leave phone message Yes     Patient questions:  Do you have a fever, pain , or abdominal swelling? no Pain Score  0 *  Have you tolerated food without any problems? yes  Have you been able to return to your normal activities? yes  Do you have any questions about your discharge instructions: Diet   no Medications  no Follow up visit  no  Do you have questions or concerns about your Care? no  Actions: * If pain score is 4 or above: No action needed, pain <4. No problems per the pt. Maw

## 2011-12-01 ENCOUNTER — Encounter (INDEPENDENT_AMBULATORY_CARE_PROVIDER_SITE_OTHER): Payer: Self-pay | Admitting: General Surgery

## 2011-12-01 ENCOUNTER — Ambulatory Visit (INDEPENDENT_AMBULATORY_CARE_PROVIDER_SITE_OTHER): Payer: BC Managed Care – PPO | Admitting: General Surgery

## 2011-12-01 VITALS — BP 126/84 | HR 80 | Temp 98.6°F | Resp 16 | Ht 70.0 in | Wt 182.4 lb

## 2011-12-01 DIAGNOSIS — K4020 Bilateral inguinal hernia, without obstruction or gangrene, not specified as recurrent: Secondary | ICD-10-CM | POA: Diagnosis not present

## 2011-12-01 NOTE — Progress Notes (Signed)
Subjective:     Patient ID: Andrew [name], male   DOB: 06-Dec-1950, 61 y.o.   MRN: [id number]  HPI The patient is a 61 year old male who was seen by Dr. Neita Carp for a while exam. Patient was found to have an inguinal hernia. He was referred for the evaluation and possible intervention. Patient states he has no pain his inguinal areas is able to carry on with his daily activities without any inguinal pain. He states there is no burning/ no pain upon lifting. Patient does have a history of a back chronic back pain for which he takes narcotics as well as arthritis.  Review of Systems  Constitutional: Negative.   HENT: Negative.   Eyes: Negative.   Respiratory: Negative.   Cardiovascular: Negative.   Gastrointestinal: Negative.   Musculoskeletal: Positive for back pain.  Neurological: Negative.        Objective:   Physical Exam  Constitutional: He is oriented to person, place, and time. He appears well-developed and well-nourished.  HENT:  Head: Normocephalic and atraumatic.  Eyes: EOM are normal. Pupils are equal, round, and reactive to light.  Neck: Normal range of motion.  Cardiovascular: Normal rate, regular rhythm and normal heart sounds.   Pulmonary/Chest: Effort normal and breath sounds normal.  Abdominal: Soft. Bowel sounds are normal. A hernia is present. Hernia confirmed positive in the right inguinal area and confirmed positive in the left inguinal area.       Bilateral reducible inguinalhernias  Musculoskeletal: Normal range of motion.  Neurological: He is alert and oriented to person, place, and time. He has normal reflexes.       Assessment:     61 year old male with reducible bilateral inguinal hernias. He states at this time his hernias are asymptomatic and wishes to wait on inguinal herniorrhaphy. I discussed the symptoms and signs of incarceration or strangulation or emergent to require the patient proceeded to the ED. Patient was given a patient handout for  laparoscopic hernia repair. He states that he agrees with bilateral inguinal hernia repair , he will call his for scheduling.    Plan:     1. Followup when necessary

## 2011-12-02 ENCOUNTER — Encounter: Payer: Self-pay | Admitting: Internal Medicine

## 2011-12-20 DIAGNOSIS — M542 Cervicalgia: Secondary | ICD-10-CM | POA: Diagnosis not present

## 2011-12-20 DIAGNOSIS — M961 Postlaminectomy syndrome, not elsewhere classified: Secondary | ICD-10-CM | POA: Diagnosis not present

## 2011-12-20 DIAGNOSIS — G894 Chronic pain syndrome: Secondary | ICD-10-CM | POA: Diagnosis not present

## 2011-12-22 ENCOUNTER — Other Ambulatory Visit (INDEPENDENT_AMBULATORY_CARE_PROVIDER_SITE_OTHER): Payer: Self-pay | Admitting: General Surgery

## 2011-12-28 ENCOUNTER — Ambulatory Visit: Payer: BC Managed Care – PPO | Admitting: Internal Medicine

## 2011-12-28 DIAGNOSIS — M069 Rheumatoid arthritis, unspecified: Secondary | ICD-10-CM | POA: Diagnosis not present

## 2011-12-28 DIAGNOSIS — M1990 Unspecified osteoarthritis, unspecified site: Secondary | ICD-10-CM | POA: Diagnosis not present

## 2011-12-28 DIAGNOSIS — G894 Chronic pain syndrome: Secondary | ICD-10-CM | POA: Diagnosis not present

## 2011-12-28 DIAGNOSIS — Z79899 Other long term (current) drug therapy: Secondary | ICD-10-CM | POA: Diagnosis not present

## 2011-12-28 DIAGNOSIS — M2550 Pain in unspecified joint: Secondary | ICD-10-CM | POA: Diagnosis not present

## 2011-12-29 ENCOUNTER — Encounter: Payer: Self-pay | Admitting: Internal Medicine

## 2011-12-30 ENCOUNTER — Encounter: Payer: Self-pay | Admitting: Internal Medicine

## 2011-12-30 ENCOUNTER — Other Ambulatory Visit (INDEPENDENT_AMBULATORY_CARE_PROVIDER_SITE_OTHER): Payer: BC Managed Care – PPO

## 2011-12-30 ENCOUNTER — Ambulatory Visit (INDEPENDENT_AMBULATORY_CARE_PROVIDER_SITE_OTHER): Payer: BC Managed Care – PPO | Admitting: Internal Medicine

## 2011-12-30 VITALS — BP 128/80 | HR 76 | Ht 68.5 in | Wt 182.2 lb

## 2011-12-30 DIAGNOSIS — R110 Nausea: Secondary | ICD-10-CM

## 2011-12-30 DIAGNOSIS — R351 Nocturia: Secondary | ICD-10-CM | POA: Diagnosis not present

## 2011-12-30 DIAGNOSIS — R109 Unspecified abdominal pain: Secondary | ICD-10-CM | POA: Diagnosis not present

## 2011-12-30 DIAGNOSIS — R1310 Dysphagia, unspecified: Secondary | ICD-10-CM

## 2011-12-30 DIAGNOSIS — R1013 Epigastric pain: Secondary | ICD-10-CM

## 2011-12-30 DIAGNOSIS — R350 Frequency of micturition: Secondary | ICD-10-CM

## 2011-12-30 DIAGNOSIS — R3911 Hesitancy of micturition: Secondary | ICD-10-CM

## 2011-12-30 DIAGNOSIS — R1314 Dysphagia, pharyngoesophageal phase: Secondary | ICD-10-CM

## 2011-12-30 DIAGNOSIS — R1319 Other dysphagia: Secondary | ICD-10-CM

## 2011-12-30 LAB — URINALYSIS WITH CULTURE, IF INDICATED
Bilirubin Urine: NEGATIVE
Hgb urine dipstick: NEGATIVE
Ketones, ur: NEGATIVE
Total Protein, Urine: NEGATIVE
Urine Glucose: NEGATIVE
Urobilinogen, UA: 0.2 (ref 0.0–1.0)

## 2011-12-30 LAB — CBC WITH DIFFERENTIAL/PLATELET
Basophils Relative: 0.6 % (ref 0.0–3.0)
Eosinophils Absolute: 0.1 10*3/uL (ref 0.0–0.7)
Eosinophils Relative: 0.5 % (ref 0.0–5.0)
HCT: 46.1 % (ref 39.0–52.0)
Hemoglobin: 15.6 g/dL (ref 13.0–17.0)
Lymphs Abs: 2 10*3/uL (ref 0.7–4.0)
MCHC: 34 g/dL (ref 30.0–36.0)
MCV: 100 fl (ref 78.0–100.0)
Monocytes Absolute: 0.7 10*3/uL (ref 0.1–1.0)
Neutro Abs: 10.4 10*3/uL — ABNORMAL HIGH (ref 1.4–7.7)
Neutrophils Relative %: 78.2 % — ABNORMAL HIGH (ref 43.0–77.0)
RBC: 4.61 Mil/uL (ref 4.22–5.81)
WBC: 13.3 10*3/uL — ABNORMAL HIGH (ref 4.5–10.5)

## 2011-12-30 LAB — BASIC METABOLIC PANEL
CO2: 32 mEq/L (ref 19–32)
Chloride: 101 mEq/L (ref 96–112)
Creatinine, Ser: 0.7 mg/dL (ref 0.4–1.5)
Potassium: 4 mEq/L (ref 3.5–5.1)

## 2011-12-30 MED ORDER — METOCLOPRAMIDE HCL 5 MG PO TABS: 5.0000 mg | ORAL_TABLET | Freq: Two times a day (BID) | ORAL | Status: DC | PRN

## 2011-12-30 NOTE — Patient Instructions (Addendum)
You have been scheduled for a CT scan of the abdomen and pelvis at [facility] ([address]---this is in the same building as [facility]).   You are scheduled on 01/04/2012 at 9:00am. You should arrive 15 minutes prior to your appointment time for registration. Please follow the written instructions below on the day of your exam:  WARNING: IF YOU ARE ALLERGIC TO IODINE/X-RAY DYE, PLEASE NOTIFY RADIOLOGY IMMEDIATELY AT [phone]! YOU WILL BE GIVEN A 13 HOUR PREMEDICATION PREP.  1) Do not eat or drink anything after 5:00am (4 hours prior to your test) 2) You have been given 2 bottles of oral contrast to drink. The solution may taste better if refrigerated, but do NOT add ice or any other liquid to this solution. Shake well before drinking.    Drink 1 bottle of contrast @ 7:00am (2 hours prior to your exam)  Drink 1 bottle of contrast @ 8:00am (1 hour prior to your exam)  You may take any medications as prescribed with a small amount of water except for the following: Metformin, Glucophage, Glucovance, Avandamet, Riomet, Fortamet, Actoplus Met, Janumet, Glumetza or Metaglip. The above medications must be held the day of the exam AND 48 hours after the exam.  The purpose of you drinking the oral contrast is to aid in the visualization of your intestinal tract. The contrast solution may cause some diarrhea. Before your exam is started, you will be given a small amount of fluid to drink. Depending on your individual set of symptoms, you may also receive an intravenous injection of x-ray contrast/dye. Plan on being at [facility] for 30 minutes or long, depending on the type of exam you are having performed.  If you have any questions regarding your exam or if you need to reschedule, you may call the CT department at [phone] between the hours of 8:00 am and 5:00 pm, Monday-Friday.  Your physician has requested that you go to the basement for lab work before leaving  today  Stop protonix  We have sent the following medications to your pharmacy for you to pick up at your convenience:Reglan; take every 12 hours as needed for nausea    ________________________________________________________________________

## 2011-12-30 NOTE — Progress Notes (Signed)
Subjective:    Patient ID: Andrew [name], male    DOB: 1951-01-24, 61 y.o.   MRN: [id number]  HPI Andrew [name] is a 61 year old malewith a past medical history of rheumatoid arthritis who seen in followup. He was initially seen in October 2013 for evaluation of dysphagia. He returns alone today. He underwent upper endoscopy on 11/29/2011 which revealed normal-appearing esophagus, small hiatal hernia, mild antral gastritis and mild bulbar duodenitis.  Colonoscopy will perform the same day was normal except for small internal hemorrhoids.  He returns today with several issues. He reports his dysphagia seems better, but still approximately once per week he has difficulty swallowing solid foods. This is transient and clears without much difficulty. He continues to report some nausea after eating, and feeling "sick". He is not vomiting. He thinks greasy foods may make this worse such as fried fish. He denies abdominal pain, but notes some epigastric burning at times. He is try pantoprazole 40 mg daily since his endoscopy without any perceived benefit.  He does report a new left-sided abdominal pain located in the left midaxillary line and left abdomen. This pain is present on most days and at times can be sharp and debilitating. He is not related to eating, bowel movement. His bowel movements have been normal without blood or melena. He does report urinary frequency, mild dysuria, nocturia, slow stream and hesitancy. He is using oxycodone on most days for joint pains, back pain, lower extremity pain which he associates with his RA.  No fevers or chills   Review of Systems As per HPI, otherwise negative  Current Medications, Allergies, Past Medical History, Past Surgical History, Family History and Social History were reviewed in [employer] record.     Objective:   Physical Exam BP 128/80  Pulse 76  Ht 5' 8.5" (1.74 m)  Wt 182 lb 4 oz (82.668 kg)  BMI 27.31 kg/m2 Constitutional:  Well-developed and well-nourished. No distress. HEENT: Normocephalic and atraumatic. Oropharynx is clear and moist. No oropharyngeal exudate. Conjunctivae are normal.  No scleral icterus. Neck: Neck supple. Trachea midline. Cardiovascular: Normal rate, regular rhythm and intact distal pulses. No M/R/G Pulmonary/chest: Effort normal and breath sounds normal. No wheezing, rales or rhonchi. Abdominal: Soft, mild left-sided abdominal tenderness with deep palpation without rebound or guarding nondistended. Bowel sounds active throughout. There are no masses palpable. Extremities: no clubbing, cyanosis, or edema Lymphadenopathy: No cervical adenopathy noted. Neurological: Alert and oriented to person place and time. Skin: Skin is warm and dry. No rashes noted. Psychiatric: Normal mood and affect. Behavior is normal.  Labs -- ordered today and pending.    Assessment & Plan:  61 year old malewith a past medical history of rheumatoid arthritis who seen in followup  1.  Post-prandial nausea, burning epigastric pain -- the patient's EGD revealed some mild gastroduodenitis but pathology results negative for H. pylori and inflammation. He did not respond a trial of PPI. We discussed this today and it may be secondary to gastroparesis associated with his narcotic pain medication use. His symptoms did not seem overtly biliary in nature and are not waking him from sleep. I will give him a trial of metoclopramide 5 mg every 12 hours when necessary for nausea and upper abdominal discomfort. We discussed the long-term side effects of metoclopramide therapy, specifically neurologic complications and tardive dyskinesia. I do not expect he would need this medication often, and do not think he will need for prolonged duration. I will see him back in 4-6 weeks  to reassess his symptoms. I will discontinue his pantoprazole given that it has not been helpful.  2.  Esophageal dysphagia -- he may benefit from empiric dilation  and biopsies were negative for eosinophilic esophagitis. We discussed esophageal manometry, but at this point he does not wish to proceed as his symptoms are tolerable.  3.  Left abd pain -- I'm uncertain as to the cause of his left-sided abdominal pain, but it seems to be limiting his activities. I recommended CT scan of the abdomen and pelvis for further evaluation. Also possible is a kidney stone or partial urinary obstruction from BPH.  4.  Urinary complaints -- basic metabolic panel today, CBC, urinalysis and culture. I've asked that he talk to Dr. Neita Carp regarding his BPH and possible treatment.  5.  Inguinal hernias -- he has inguinal hernia repair scheduled per Dr. Derrell Lolling

## 2012-01-04 ENCOUNTER — Ambulatory Visit (INDEPENDENT_AMBULATORY_CARE_PROVIDER_SITE_OTHER)
Admission: RE | Admit: 2012-01-04 | Discharge: 2012-01-04 | Disposition: A | Discharge status: Home / Self Care | Payer: BC Managed Care – PPO | Source: Ambulatory Visit | Attending: Internal Medicine | Admitting: Internal Medicine

## 2012-01-04 DIAGNOSIS — R110 Nausea: Secondary | ICD-10-CM | POA: Diagnosis not present

## 2012-01-04 DIAGNOSIS — R109 Unspecified abdominal pain: Secondary | ICD-10-CM | POA: Diagnosis not present

## 2012-01-04 DIAGNOSIS — K439 Ventral hernia without obstruction or gangrene: Secondary | ICD-10-CM | POA: Diagnosis not present

## 2012-01-04 MED ORDER — IOHEXOL 300 MG/ML  SOLN
100.0000 mL | Freq: Once | INTRAMUSCULAR | Status: AC | PRN
  Administered 2012-01-04: 100 mL via INTRAVENOUS

## 2012-01-20 ENCOUNTER — Encounter (HOSPITAL_COMMUNITY): Payer: Self-pay | Admitting: Pharmacy Technician

## 2012-01-21 ENCOUNTER — Encounter (HOSPITAL_COMMUNITY): Payer: Self-pay

## 2012-01-21 ENCOUNTER — Encounter (HOSPITAL_COMMUNITY)
Admission: RE | Admit: 2012-01-21 | Discharge: 2012-01-21 | Disposition: A | Discharge status: Home / Self Care | Payer: BC Managed Care – PPO | Source: Ambulatory Visit | Attending: General Surgery | Admitting: General Surgery

## 2012-01-21 DIAGNOSIS — E039 Hypothyroidism, unspecified: Secondary | ICD-10-CM | POA: Diagnosis not present

## 2012-01-21 DIAGNOSIS — Z01812 Encounter for preprocedural laboratory examination: Secondary | ICD-10-CM | POA: Diagnosis not present

## 2012-01-21 DIAGNOSIS — K449 Diaphragmatic hernia without obstruction or gangrene: Secondary | ICD-10-CM | POA: Diagnosis not present

## 2012-01-21 DIAGNOSIS — J449 Chronic obstructive pulmonary disease, unspecified: Secondary | ICD-10-CM | POA: Diagnosis not present

## 2012-01-21 DIAGNOSIS — K4020 Bilateral inguinal hernia, without obstruction or gangrene, not specified as recurrent: Secondary | ICD-10-CM | POA: Diagnosis not present

## 2012-01-21 HISTORY — DX: Nausea with vomiting, unspecified: R11.2

## 2012-01-21 HISTORY — DX: Benign prostatic hyperplasia without lower urinary tract symptoms: N40.0

## 2012-01-21 HISTORY — DX: Chronic obstructive pulmonary disease, unspecified: J44.9

## 2012-01-21 HISTORY — DX: Unspecified asthma, uncomplicated: J45.909

## 2012-01-21 HISTORY — DX: Other difficulties with micturition: R39.198

## 2012-01-21 HISTORY — DX: Other specified postprocedural states: Z98.890

## 2012-01-21 HISTORY — DX: Disorder of thyroid, unspecified: E07.9

## 2012-01-21 HISTORY — DX: Spontaneous ecchymoses: R23.3

## 2012-01-21 HISTORY — DX: Other skin changes: R23.8

## 2012-01-21 HISTORY — DX: Personal history of other diseases of the digestive system: Z87.19

## 2012-01-21 LAB — CBC
HCT: 42.4 % (ref 39.0–52.0)
Hemoglobin: 14.9 g/dL (ref 13.0–17.0)
MCH: 33.9 pg (ref 26.0–34.0)
RBC: 4.4 MIL/uL (ref 4.22–5.81)

## 2012-01-21 LAB — SURGICAL PCR SCREEN: MRSA, PCR: NEGATIVE

## 2012-01-21 NOTE — Patient Instructions (Signed)
Andrew [name]  01/21/2012                           YOUR PROCEDURE IS SCHEDULED ON:  01/28/12               PLEASE REPORT TO SHORT STAY CENTER AT :  5:30 AM               CALL THIS NUMBER IF ANY PROBLEMS THE DAY OF SURGERY :               [phone]                      REMEMBER:   Do not eat food or drink liquids AFTER MIDNIGHT   Take these medicines the morning of surgery with A SIP OF WATER: PLAQUENIL / LEVOTHYROXINE / MORPHINE  Do not wear jewelry, make-up   Do not wear lotions, powders, or perfumes.   Do not shave legs or underarms 12 hrs. before surgery (men may shave face)  Do not bring valuables to the hospital.  Contacts, dentures or bridgework may not be worn into surgery.  Leave suitcase in the car. After surgery it may be brought to your room.  For patients admitted to the hospital more than one night, checkout time is 11:00                          The day of discharge.   Patients discharged the day of surgery will not be allowed to drive home                             If going home same day of surgery, must have someone stay with you first                           24 hrs at home and arrange for some one to drive you home from hospital.    Special Instructions:   Please read over the following fact sheets that you were given:               1. MRSA  INFORMATION                      2. [facility] PREPARING FOR SURGERY SHEET                                                X_____________________________________________________________________        Failure to follow these instructions may result in cancellation of your surgery

## 2012-01-27 ENCOUNTER — Telehealth (INDEPENDENT_AMBULATORY_CARE_PROVIDER_SITE_OTHER): Payer: Self-pay

## 2012-01-27 NOTE — Telephone Encounter (Signed)
Patient calling into office to discuss pain control after surgery.  Patient reports he has a pain management contract with Dr. Ethelene Hal @ [facility] from previous surgeries.  Patient currently taking morphine 30mg  SR bid and Percocet 5-325mg  tid for breakthrough pain.  Patient concerned that the medication that he is given for post operative pain will not help with pain due to a high tolerance.  Patient states he does not want to continue with surgery if he's not given a pain medication that will help will post operative pain.  Patient aware that this will be forwarded to Dr. Derrell Lolling for review and we will contact patient with further information.  Patient scheduled for Bilateral Inguinal hernia on 01/28/12.

## 2012-01-27 NOTE — Telephone Encounter (Signed)
THank you.  Well my standard for post op pain meds is PErcocet.  We can give him 10/325 post op.   If he wants to cancel his surgery because he is afraid of possible post op pain, that will be his decision, but I won't be giving him more that 10/325

## 2012-01-28 ENCOUNTER — Ambulatory Visit (HOSPITAL_COMMUNITY)
Admission: RE | Admit: 2012-01-28 | Discharge: 2012-01-28 | Disposition: A | Discharge status: Home / Self Care | Payer: BC Managed Care – PPO | Source: Ambulatory Visit | Attending: General Surgery | Admitting: General Surgery

## 2012-01-28 ENCOUNTER — Encounter (HOSPITAL_COMMUNITY): Payer: Self-pay | Admitting: *Deleted

## 2012-01-28 ENCOUNTER — Encounter (HOSPITAL_COMMUNITY)
Admission: RE | Disposition: A | Discharge status: Home / Self Care | Payer: Self-pay | Source: Ambulatory Visit | Attending: General Surgery

## 2012-01-28 ENCOUNTER — Encounter (HOSPITAL_COMMUNITY): Payer: Self-pay | Admitting: Registered Nurse

## 2012-01-28 ENCOUNTER — Ambulatory Visit (HOSPITAL_COMMUNITY): Payer: BC Managed Care – PPO | Admitting: Registered Nurse

## 2012-01-28 DIAGNOSIS — K4020 Bilateral inguinal hernia, without obstruction or gangrene, not specified as recurrent: Secondary | ICD-10-CM | POA: Insufficient documentation

## 2012-01-28 DIAGNOSIS — J449 Chronic obstructive pulmonary disease, unspecified: Secondary | ICD-10-CM | POA: Insufficient documentation

## 2012-01-28 DIAGNOSIS — Z01812 Encounter for preprocedural laboratory examination: Secondary | ICD-10-CM | POA: Insufficient documentation

## 2012-01-28 DIAGNOSIS — J4489 Other specified chronic obstructive pulmonary disease: Secondary | ICD-10-CM | POA: Insufficient documentation

## 2012-01-28 DIAGNOSIS — K449 Diaphragmatic hernia without obstruction or gangrene: Secondary | ICD-10-CM | POA: Insufficient documentation

## 2012-01-28 DIAGNOSIS — E039 Hypothyroidism, unspecified: Secondary | ICD-10-CM | POA: Insufficient documentation

## 2012-01-28 HISTORY — PX: INSERTION OF MESH: SHX5868

## 2012-01-28 HISTORY — PX: INGUINAL HERNIA REPAIR: SHX194

## 2012-01-28 SURGERY — REPAIR, HERNIA, INGUINAL, BILATERAL, LAPAROSCOPIC
Anesthesia: General | Laterality: Bilateral | Wound class: Clean

## 2012-01-28 MED ORDER — OXYCODONE HCL 5 MG PO TABS
5.0000 mg | ORAL_TABLET | ORAL | Status: DC | PRN
  Administered 2012-01-28: 5 mg via ORAL
  Filled 2012-01-28: qty 1

## 2012-01-28 MED ORDER — HYDROCODONE-ACETAMINOPHEN 10-500 MG PO TABS: 1.0000 | ORAL_TABLET | Freq: Four times a day (QID) | ORAL | Status: DC | PRN

## 2012-01-28 MED ORDER — SODIUM CHLORIDE 0.9 % IV SOLN: 250.0000 mL | INTRAVENOUS | Status: DC | PRN

## 2012-01-28 MED ORDER — NEOSTIGMINE METHYLSULFATE 1 MG/ML IJ SOLN
INTRAMUSCULAR | Status: DC | PRN
  Administered 2012-01-28: 4 mg via INTRAVENOUS

## 2012-01-28 MED ORDER — FENTANYL CITRATE 0.05 MG/ML IJ SOLN
INTRAMUSCULAR | Status: DC | PRN
  Administered 2012-01-28 (×5): 50 ug via INTRAVENOUS

## 2012-01-28 MED ORDER — MIDAZOLAM HCL 5 MG/5ML IJ SOLN
INTRAMUSCULAR | Status: DC | PRN
  Administered 2012-01-28: 2 mg via INTRAVENOUS

## 2012-01-28 MED ORDER — VANCOMYCIN HCL IN DEXTROSE 1-5 GM/200ML-% IV SOLN
1000.0000 mg | INTRAVENOUS | Status: AC
  Administered 2012-01-28: 1000 mg via INTRAVENOUS

## 2012-01-28 MED ORDER — BUPIVACAINE-EPINEPHRINE 0.25% -1:200000 IJ SOLN
INTRAMUSCULAR | Status: AC
  Filled 2012-01-28: qty 1

## 2012-01-28 MED ORDER — ACETAMINOPHEN 10 MG/ML IV SOLN
INTRAVENOUS | Status: DC | PRN
  Administered 2012-01-28: 1000 mg via INTRAVENOUS

## 2012-01-28 MED ORDER — CHLORHEXIDINE GLUCONATE 4 % EX LIQD
1.0000 "application " | Freq: Once | CUTANEOUS | Status: DC
  Filled 2012-01-28: qty 15

## 2012-01-28 MED ORDER — ACETAMINOPHEN 325 MG PO TABS: 650.0000 mg | ORAL_TABLET | ORAL | Status: DC | PRN

## 2012-01-28 MED ORDER — GLYCOPYRROLATE 0.2 MG/ML IJ SOLN
INTRAMUSCULAR | Status: DC | PRN
  Administered 2012-01-28: .7 mg via INTRAVENOUS

## 2012-01-28 MED ORDER — ONDANSETRON HCL 4 MG/2ML IJ SOLN: 4.0000 mg | Freq: Four times a day (QID) | INTRAMUSCULAR | Status: DC | PRN

## 2012-01-28 MED ORDER — SODIUM CHLORIDE 0.9 % IJ SOLN: 3.0000 mL | Freq: Two times a day (BID) | INTRAMUSCULAR | Status: DC

## 2012-01-28 MED ORDER — ONDANSETRON HCL 4 MG/2ML IJ SOLN
INTRAMUSCULAR | Status: DC | PRN
  Administered 2012-01-28: 4 mg via INTRAVENOUS

## 2012-01-28 MED ORDER — ROCURONIUM BROMIDE 100 MG/10ML IV SOLN
INTRAVENOUS | Status: DC | PRN
  Administered 2012-01-28: 50 mg via INTRAVENOUS

## 2012-01-28 MED ORDER — SODIUM CHLORIDE 0.9 % IJ SOLN: 3.0000 mL | INTRAMUSCULAR | Status: DC | PRN

## 2012-01-28 MED ORDER — HYDROMORPHONE HCL PF 1 MG/ML IJ SOLN: 0.2500 mg | INTRAMUSCULAR | Status: DC | PRN

## 2012-01-28 MED ORDER — LIDOCAINE HCL (CARDIAC) 20 MG/ML IV SOLN
INTRAVENOUS | Status: DC | PRN
  Administered 2012-01-28: 80 mg via INTRAVENOUS

## 2012-01-28 MED ORDER — LACTATED RINGERS IV SOLN: INTRAVENOUS | Status: DC

## 2012-01-28 MED ORDER — KETOROLAC TROMETHAMINE 30 MG/ML IJ SOLN
INTRAMUSCULAR | Status: DC | PRN
  Administered 2012-01-28: 30 mg via INTRAVENOUS

## 2012-01-28 MED ORDER — LACTATED RINGERS IV SOLN
INTRAVENOUS | Status: DC | PRN
  Administered 2012-01-28: 07:00:00 via INTRAVENOUS

## 2012-01-28 MED ORDER — PROMETHAZINE HCL 25 MG/ML IJ SOLN: 6.2500 mg | INTRAMUSCULAR | Status: DC | PRN

## 2012-01-28 MED ORDER — PROPOFOL 10 MG/ML IV BOLUS
INTRAVENOUS | Status: DC | PRN
  Administered 2012-01-28: 200 mg via INTRAVENOUS

## 2012-01-28 MED ORDER — ACETAMINOPHEN 650 MG RE SUPP
650.0000 mg | RECTAL | Status: DC | PRN
  Filled 2012-01-28: qty 1

## 2012-01-28 MED ORDER — BUPIVACAINE-EPINEPHRINE 0.25% -1:200000 IJ SOLN
INTRAMUSCULAR | Status: DC | PRN
  Administered 2012-01-28: 45 mL

## 2012-01-28 MED ORDER — DEXAMETHASONE SODIUM PHOSPHATE 10 MG/ML IJ SOLN
INTRAMUSCULAR | Status: DC | PRN
  Administered 2012-01-28: 10 mg via INTRAVENOUS

## 2012-01-28 SURGICAL SUPPLY — 40 items
BENZOIN TINCTURE PRP APPL 2/3 (GAUZE/BANDAGES/DRESSINGS) ×2 IMPLANT
CABLE HIGH FREQUENCY MONO STRZ (ELECTRODE) ×2 IMPLANT
CLOTH BEACON ORANGE TIMEOUT ST (SAFETY) ×2 IMPLANT
DECANTER SPIKE VIAL GLASS SM (MISCELLANEOUS) ×2 IMPLANT
DISSECT BALLN SPACEMKR + OVL (BALLOONS) ×2
DISSECT BALLN SPACEMKR OVL PDB (BALLOONS)
DISSECTOR BALLN SPACEMKR + OVL (BALLOONS) ×1 IMPLANT
DISSECTOR BALLN SPCMKR OVL PDB (BALLOONS) IMPLANT
DISSECTOR BLUNT TIP ENDO 5MM (MISCELLANEOUS) IMPLANT
DRAPE LAPAROSCOPIC ABDOMINAL (DRAPES) ×2 IMPLANT
DRSG TEGADERM 2-3/8X2-3/4 SM (GAUZE/BANDAGES/DRESSINGS) IMPLANT
ELECT REM PT RETURN 9FT ADLT (ELECTROSURGICAL) ×2
ELECTRODE REM PT RTRN 9FT ADLT (ELECTROSURGICAL) ×1 IMPLANT
GAUZE SPONGE 2X2 8PLY STRL LF (GAUZE/BANDAGES/DRESSINGS) ×1 IMPLANT
GLOVE BIO SURGEON STRL SZ7.5 (GLOVE) ×2 IMPLANT
GLOVE BIOGEL PI IND STRL 7.0 (GLOVE) ×1 IMPLANT
GLOVE BIOGEL PI INDICATOR 7.0 (GLOVE) ×1
GLOVE SURG SS PI 6.5 STRL IVOR (GLOVE) ×2 IMPLANT
GOWN STRL NON-REIN LRG LVL3 (GOWN DISPOSABLE) ×2 IMPLANT
GOWN STRL REIN XL XLG (GOWN DISPOSABLE) ×4 IMPLANT
KIT BASIN OR (CUSTOM PROCEDURE TRAY) ×2 IMPLANT
MARKER SKIN DUAL TIP RULER LAB (MISCELLANEOUS) IMPLANT
MESH PARIETEX 20X20CM (Mesh General) ×2 IMPLANT
NEEDLE INSUFFLATION 14GA 120MM (NEEDLE) ×2 IMPLANT
NS IRRIG 1000ML POUR BTL (IV SOLUTION) ×2 IMPLANT
PENCIL BUTTON HOLSTER BLD 10FT (ELECTRODE) IMPLANT
SCISSORS LAP 5X35 DISP (ENDOMECHANICALS) IMPLANT
SET IRRIG TUBING LAPAROSCOPIC (IRRIGATION / IRRIGATOR) IMPLANT
SOLUTION ANTI FOG 6CC (MISCELLANEOUS) ×2 IMPLANT
SPONGE GAUZE 2X2 STER 10/PKG (GAUZE/BANDAGES/DRESSINGS) ×1
STAPLER HERNIA 12 8.5 360D (INSTRUMENTS) ×2 IMPLANT
STRIP CLOSURE SKIN 1/2X4 (GAUZE/BANDAGES/DRESSINGS) ×2 IMPLANT
SUT MNCRL AB 4-0 PS2 18 (SUTURE) ×2 IMPLANT
SYR 30ML LL (SYRINGE) ×2 IMPLANT
TAPE STRIPS DRAPE STRL (GAUZE/BANDAGES/DRESSINGS) ×2 IMPLANT
TOWEL OR 17X26 10 PK STRL BLUE (TOWEL DISPOSABLE) ×2 IMPLANT
TRAY FOLEY CATH 14FRSI W/METER (CATHETERS) ×2 IMPLANT
TRAY LAP CHOLE (CUSTOM PROCEDURE TRAY) ×2 IMPLANT
TROCAR BLADELESS OPT 5 75 (ENDOMECHANICALS) ×4 IMPLANT
TUBING INSUFFLATION 10FT LAP (TUBING) ×2 IMPLANT

## 2012-01-28 NOTE — Anesthesia Preprocedure Evaluation (Addendum)
Anesthesia Evaluation  Patient identified by MRN, date of birth, ID band Patient awake    Reviewed: Allergy & Precautions, H&P , NPO status , Patient's Chart, lab work & pertinent test results  History of Anesthesia Complications (+) PONV  Airway Mallampati: II TM Distance: >3 FB Neck ROM: Full    Dental  (+) Edentulous Upper and Edentulous Lower   Pulmonary neg pulmonary ROS, shortness of breath, asthma , COPDformer smoker,  breath sounds clear to auscultation  Pulmonary exam normal       Cardiovascular negative cardio ROS  Rhythm:Regular Rate:Normal     Neuro/Psych Spinal cord stimulator implant negative neurological ROS  negative psych ROS   GI/Hepatic hiatal hernia, (+)     substance abuse   ,   Endo/Other  Hypothyroidism   Renal/GU negative Renal ROS  negative genitourinary   Musculoskeletal  (+) Arthritis -,   Abdominal   Peds  Hematology negative hematology ROS (+)   Anesthesia Other Findings Patient states spinal cord stimulator was turned off last pm and is inactive at this time.  Reproductive/Obstetrics negative OB ROS                          Anesthesia Physical Anesthesia Plan  ASA: III  Anesthesia Plan: General   Post-op Pain Management:    Induction: Intravenous  Airway Management Planned: Oral ETT  Additional Equipment:   Intra-op Plan:   Post-operative Plan: Extubation in OR  Informed Consent: I have reviewed the patients History and Physical, chart, labs and discussed the procedure including the risks, benefits and alternatives for the proposed anesthesia with the patient or authorized representative who has indicated his/her understanding and acceptance.   Dental advisory given  Plan Discussed with: CRNA  Anesthesia Plan Comments:         Anesthesia Quick Evaluation

## 2012-01-28 NOTE — Transfer of Care (Signed)
Immediate Anesthesia Transfer of Care Note  Patient: Andrew [name]  Procedure(s) Performed: Procedure(s) (LRB) with comments: LAPAROSCOPIC BILATERAL INGUINAL HERNIA REPAIR (Bilateral) INSERTION OF MESH (Bilateral)  Patient Location: PACU  Anesthesia Type:General  Level of Consciousness: awake, alert , oriented and patient cooperative  Airway & Oxygen Therapy: Patient Spontanous Breathing and Patient connected to face mask oxygen  Post-op Assessment: Report given to PACU RN, Post -op Vital signs reviewed and stable and Patient moving all extremities  Post vital signs: Reviewed and stable  Complications: No apparent anesthesia complications

## 2012-01-28 NOTE — Op Note (Signed)
Pre Operative Diagnosis: bilateral inguinal hernia  Post Operative Diagnosis: same  Procedure: laparoscopic bilateral inguinal hernia repair with mesh  Surgeon: Dr. Axel Filler  Assistant: none  Anesthesia: GETA  EBL: 10 cc  Complications: none  Counts: reported as correct x 2  Findings:  The patient had a small bilateral indirect hernias  Indications for procedure:  The patient is a 61 year old male with a bilateral inguinal hernias for several months. Patient complained of symptomatology to his bilateral groins. The patient was taken back for elective right repair.  Details of the procedure:The patient was taken back to the operating room. The patient was placed in supine position with bilateral SCDs in place. After appropriate anitbiotics were confirmed, a time-out was confirmed and all facts were verified.  0.25% Marcaine was used to infiltrate the umbilical area. 11 blade was used to cut down the skin and blunt dissection was used to get the anterior fashion.  The anterior fascia was incised approximately 1 cm and the muscles were divided anteriorly. Inguinal hernia dissecting balloon was used to create a preperitoneal space with a 10 mm camera in place.  Insufflation was started.  At this time and space was created from medial to laterally the preperitoneal space. The hernia sac was identified. Dissection of the hernia sac was undertaken the vas deferens was identified and protected in all parts of the case.   Once the indirect hernia sac was taken down to approximately the umbilicus to TET 20x20 Parietex mesh was cut in and into shape and introduced into the preperitoneal space.  The mesh was brought over the hernia defect and anchored into place and secured to Cooper's ligament with 4.0 there was a hernia stapler staples. It was anchored to the anterior abdominal wall with 4.8 mm staples. The hernia sac was seen over the mesh. There was no staples placed laterally.  The exact same  procedure took place on the left side with half TET 20 x 20 Parietex mesh. The insufflation was evacuated. The trochars were removed. The anterior fascia was reapproximated using #1 Vicryl on a UR- 6.  Intra-abdominal air was evacuated the Veress needle. An ilioinguinal nerve block was placed using approximately 10 cc of lidocaine with epi on each side.  The skin was reapproximated using 4-0 Monocryl subcuticular fashion the patient was awakened from general anesthesia and taken to recovery in stable condition.

## 2012-01-28 NOTE — Preoperative (Signed)
Beta Blockers   Reason not to administer Beta Blockers:Not Applicable 

## 2012-01-28 NOTE — Anesthesia Postprocedure Evaluation (Signed)
Anesthesia Post Note  Patient: Andrew [name]  Procedure(s) Performed: Procedure(s) (LRB): LAPAROSCOPIC BILATERAL INGUINAL HERNIA REPAIR (Bilateral) INSERTION OF MESH (Bilateral)  Anesthesia type: General  Patient location: PACU  Post pain: Pain level controlled  Post assessment: Post-op Vital signs reviewed  Last Vitals:  Filed Vitals:   01/28/12 0945  BP: 159/73  Pulse: 88  Temp:   Resp: 11    Post vital signs: Reviewed  Level of consciousness: sedated  Complications: No apparent anesthesia complications

## 2012-01-28 NOTE — H&P (Signed)
  HPI  The patient is a 61 year old male who was seen by Dr. Neita Carp for a while exam. Patient was found to have an inguinal hernia. He was referred for the evaluation and possible intervention. Patient states he has no pain his inguinal areas is able to carry on with his daily activities without any inguinal pain. He states there is no burning/ no pain upon lifting. Patient does have a history of a back chronic back pain for which he takes narcotics as well as arthritis.  Review of Systems  Constitutional: Negative.  HENT: Negative.  Eyes: Negative.  Respiratory: Negative.  Cardiovascular: Negative.  Gastrointestinal: Negative.  Musculoskeletal: Positive for back pain.  Neurological: Negative.  Objective:   Physical Exam  Constitutional: He is oriented to person, place, and time. He appears well-developed and well-nourished.  HENT:  Head: Normocephalic and atraumatic.  Eyes: EOM are normal. Pupils are equal, round, and reactive to light.  Neck: Normal range of motion.  Cardiovascular: Normal rate, regular rhythm and normal heart sounds.  Pulmonary/Chest: Effort normal and breath sounds normal.  Abdominal: Soft. Bowel sounds are normal. A hernia is present. Hernia confirmed positive in the right inguinal area and confirmed positive in the left inguinal area.  Bilateral reducible inguinalhernias  Musculoskeletal: Normal range of motion.  Neurological: He is alert and oriented to person, place, and time. He has normal reflexes.  Assessment:   61 year old male with reducible bilateral inguinal hernias. He states at this time his hernias are asymptomatic and wishes to wait on inguinal herniorrhaphy. I discussed the symptoms and signs of incarceration or strangulation or emergent to require the patient proceeded to the ED. Patient was given a patient handout for laparoscopic hernia repair. He states that he agrees with bilateral inguinal hernia repair , he will call his for scheduling.  Plan:    1. We will proceed with lap BIHR with mesh 2. All risks and benefits were discussed with the patient, to generally include infection, bleeding, damage to surrounding structures, and recurrence. Alternatives were offered and described.  All questions were answered and the patient voiced understanding of the procedure and wishes to proceed at this point.

## 2012-01-31 ENCOUNTER — Encounter (HOSPITAL_COMMUNITY): Payer: Self-pay | Admitting: General Surgery

## 2012-02-17 ENCOUNTER — Ambulatory Visit (INDEPENDENT_AMBULATORY_CARE_PROVIDER_SITE_OTHER): Payer: BC Managed Care – PPO | Admitting: General Surgery

## 2012-02-17 ENCOUNTER — Encounter (INDEPENDENT_AMBULATORY_CARE_PROVIDER_SITE_OTHER): Payer: Self-pay | Admitting: General Surgery

## 2012-02-17 VITALS — BP 132/84 | HR 80 | Temp 97.6°F | Resp 18 | Ht 70.0 in | Wt 188.8 lb

## 2012-02-17 DIAGNOSIS — Z9889 Other specified postprocedural states: Secondary | ICD-10-CM

## 2012-02-17 DIAGNOSIS — Z8719 Personal history of other diseases of the digestive system: Secondary | ICD-10-CM

## 2012-02-17 NOTE — Progress Notes (Signed)
Patient ID: IVORY BAIL, male   DOB: 1951-02-03, 62 y.o.   MRN: [id number] The patient is a 62 year old now status post bilateral laparoscopic inguinal hernia repair with Mesh. Patient has been doing well postoperatively aside from his right testicular soreness.  The patient has abrasion which is currently resolved at this time.  On exam: The wounds are clean dry and intact There is no palpable hernia   Assessment and plan 62 year old male status post laparoscopic bilateral inguinal hernia repair  We discussed weight restrictions for another 3-4 weeks. Patient followup prn

## 2012-03-09 DIAGNOSIS — G894 Chronic pain syndrome: Secondary | ICD-10-CM | POA: Diagnosis not present

## 2012-03-28 DIAGNOSIS — G894 Chronic pain syndrome: Secondary | ICD-10-CM | POA: Diagnosis not present

## 2012-03-28 DIAGNOSIS — M6281 Muscle weakness (generalized): Secondary | ICD-10-CM | POA: Diagnosis not present

## 2012-03-28 DIAGNOSIS — M1990 Unspecified osteoarthritis, unspecified site: Secondary | ICD-10-CM | POA: Diagnosis not present

## 2012-03-28 DIAGNOSIS — M069 Rheumatoid arthritis, unspecified: Secondary | ICD-10-CM | POA: Diagnosis not present

## 2012-03-28 DIAGNOSIS — Z79899 Other long term (current) drug therapy: Secondary | ICD-10-CM | POA: Diagnosis not present

## 2012-03-30 DIAGNOSIS — M47812 Spondylosis without myelopathy or radiculopathy, cervical region: Secondary | ICD-10-CM | POA: Diagnosis not present

## 2012-03-30 DIAGNOSIS — M79609 Pain in unspecified limb: Secondary | ICD-10-CM | POA: Diagnosis not present

## 2012-03-30 DIAGNOSIS — R5381 Other malaise: Secondary | ICD-10-CM | POA: Diagnosis not present

## 2012-03-31 ENCOUNTER — Other Ambulatory Visit: Payer: Self-pay | Admitting: Neurology

## 2012-03-31 DIAGNOSIS — M79609 Pain in unspecified limb: Secondary | ICD-10-CM

## 2012-03-31 DIAGNOSIS — R5381 Other malaise: Secondary | ICD-10-CM

## 2012-03-31 DIAGNOSIS — M47812 Spondylosis without myelopathy or radiculopathy, cervical region: Secondary | ICD-10-CM

## 2012-04-03 DIAGNOSIS — F411 Generalized anxiety disorder: Secondary | ICD-10-CM | POA: Diagnosis not present

## 2012-04-03 DIAGNOSIS — N400 Enlarged prostate without lower urinary tract symptoms: Secondary | ICD-10-CM | POA: Diagnosis not present

## 2012-04-03 DIAGNOSIS — R5381 Other malaise: Secondary | ICD-10-CM | POA: Diagnosis not present

## 2012-04-03 DIAGNOSIS — R350 Frequency of micturition: Secondary | ICD-10-CM | POA: Diagnosis not present

## 2012-04-06 ENCOUNTER — Ambulatory Visit
Admission: RE | Admit: 2012-04-06 | Discharge: 2012-04-06 | Disposition: A | Discharge status: Home / Self Care | Payer: BC Managed Care – PPO | Source: Ambulatory Visit | Attending: Neurology | Admitting: Neurology

## 2012-04-06 DIAGNOSIS — M79609 Pain in unspecified limb: Secondary | ICD-10-CM

## 2012-04-06 DIAGNOSIS — R5381 Other malaise: Secondary | ICD-10-CM

## 2012-04-06 DIAGNOSIS — M47812 Spondylosis without myelopathy or radiculopathy, cervical region: Secondary | ICD-10-CM

## 2012-04-06 DIAGNOSIS — M5030 Other cervical disc degeneration, unspecified cervical region: Secondary | ICD-10-CM | POA: Diagnosis not present

## 2012-04-06 DIAGNOSIS — M4802 Spinal stenosis, cervical region: Secondary | ICD-10-CM | POA: Diagnosis not present

## 2012-04-27 ENCOUNTER — Ambulatory Visit (INDEPENDENT_AMBULATORY_CARE_PROVIDER_SITE_OTHER): Payer: BC Managed Care – PPO | Admitting: Emergency Medicine

## 2012-04-27 ENCOUNTER — Encounter: Payer: Self-pay | Admitting: Emergency Medicine

## 2012-04-27 VITALS — BP 150/80 | HR 77 | Temp 97.4°F | Ht 69.0 in | Wt 196.2 lb

## 2012-04-27 DIAGNOSIS — J449 Chronic obstructive pulmonary disease, unspecified: Secondary | ICD-10-CM

## 2012-04-27 DIAGNOSIS — M069 Rheumatoid arthritis, unspecified: Secondary | ICD-10-CM | POA: Diagnosis not present

## 2012-04-27 DIAGNOSIS — J4489 Other specified chronic obstructive pulmonary disease: Secondary | ICD-10-CM

## 2012-04-27 NOTE — Progress Notes (Signed)
History of Present Illness:  62 yo former smoker, hx of RA on chronic pred and MTX (started 2011). Childhood asthma that he grew out of. He has had trouble as an adult when he gets a URI. Has been freq treated with He has exertional dyspnea that is worse over about 2 months. Hears wheezing with exertion. As part eval had CXR, not available at this time. He tell me that the film was abnormal, not sure what it showed. Has seen Dr Sung Amabile before, has been on Advair, albuterol, maybe others.   ROV 01/09/10 -- dyspnea, hx tobacco, RA/MTX. Has been on Spiriva and Advair before per old chart in 05, 06. PFT today show moderate AFL, borderline BD response, no restriction. Normal DLCO. Has siblings w COPD.   ROV 09/17/11 -- 62 yo man, former tobacco, hx RA on MTX and humira. Last time 2011 we showed that he has moderate AFL on spiro. He tells me that he is having progressive SOB over many months time. We had planned to restart BD's last time. He isn't using anything right now. He didn't tolerate Spiriva or Advair - felt bad when he used them. He describes throat and esophageal pain that happens acutely and then resolves - ? Nutcracker esophagus.   ROV 10/18/11 -- 62 yo man, former tobacco, hx RA on MTX and humira.  Last visit we started Symbicort + SABA prn (had AFL on spiro 2011). No evidence ILD on CXR last time (for RA and MTX). He isn't sure that the Symbicort has helped him. He hasn't uses the albuterol   ROV 04/27/12 --  62 yo man, former tobacco and COPD (moderate AFL on spiro), hx RA on MTX and humira. Returns for f/u. He stopped symbicort after no benefit on therapeutic trial.  He tells me that his breathing has been stable. Still gets out of breath with heavy lifting, moving too quickly. He has SABA but never uses.   Filed Vitals:   04/27/12 1357  BP: 150/80  Pulse: 77  Temp: 97.4 F (36.3 C)   Gen: Pleasant, well-nourished, in no distress,  normal affect  ENT: No lesions,  mouth clear,  oropharynx  clear, no postnasal drip  Neck: No JVD, no TMG, no carotid bruits  Lungs: No use of accessory muscles, no wheeze on normal breath, mild exp wheeze on forced exp  Cardiovascular: RRR, heart sounds normal, no murmur or gallops, no peripheral edema  Musculoskeletal: No deformities, no cyanosis or clubbing  Neuro: alert, non focal  Skin: Warm, no lesions or rashes  COPD Has some limitations but stable. He avoids using albuterol. I have encouraged him to be more liberal about using it if he is having dyspnea  ARTHRITIS, RHEUMATOID No evidence ILD to date - repeat CXR in 1 year or sooner if he clinically changes

## 2012-04-27 NOTE — Assessment & Plan Note (Signed)
No evidence ILD to date - repeat CXR in 1 year or sooner if he clinically changes

## 2012-04-27 NOTE — Patient Instructions (Addendum)
Please keep your albuterol inhaler available to use 2 puffs as needed for shortness We need to follow up in 1 year with a CXR.  Please call our office if you have any problems so we can see you sooner

## 2012-04-27 NOTE — Assessment & Plan Note (Signed)
Has some limitations but stable. He avoids using albuterol. I have encouraged him to be more liberal about using it if he is having dyspnea

## 2012-05-24 DIAGNOSIS — N5314 Retrograde ejaculation: Secondary | ICD-10-CM | POA: Diagnosis not present

## 2012-05-24 DIAGNOSIS — F411 Generalized anxiety disorder: Secondary | ICD-10-CM | POA: Diagnosis not present

## 2012-05-24 DIAGNOSIS — R5381 Other malaise: Secondary | ICD-10-CM | POA: Diagnosis not present

## 2012-05-24 DIAGNOSIS — R350 Frequency of micturition: Secondary | ICD-10-CM | POA: Diagnosis not present

## 2012-05-24 DIAGNOSIS — N400 Enlarged prostate without lower urinary tract symptoms: Secondary | ICD-10-CM | POA: Diagnosis not present

## 2012-05-24 DIAGNOSIS — R5383 Other fatigue: Secondary | ICD-10-CM | POA: Diagnosis not present

## 2012-06-05 DIAGNOSIS — Z79899 Other long term (current) drug therapy: Secondary | ICD-10-CM | POA: Diagnosis not present

## 2012-06-27 DIAGNOSIS — M2550 Pain in unspecified joint: Secondary | ICD-10-CM | POA: Diagnosis not present

## 2012-06-27 DIAGNOSIS — G894 Chronic pain syndrome: Secondary | ICD-10-CM | POA: Diagnosis not present

## 2012-06-27 DIAGNOSIS — E039 Hypothyroidism, unspecified: Secondary | ICD-10-CM | POA: Diagnosis not present

## 2012-06-27 DIAGNOSIS — Z79899 Other long term (current) drug therapy: Secondary | ICD-10-CM | POA: Diagnosis not present

## 2012-06-27 DIAGNOSIS — M1990 Unspecified osteoarthritis, unspecified site: Secondary | ICD-10-CM | POA: Diagnosis not present

## 2012-06-27 DIAGNOSIS — M069 Rheumatoid arthritis, unspecified: Secondary | ICD-10-CM | POA: Diagnosis not present

## 2012-07-06 DIAGNOSIS — G894 Chronic pain syndrome: Secondary | ICD-10-CM | POA: Diagnosis not present

## 2012-07-06 DIAGNOSIS — M545 Low back pain, unspecified: Secondary | ICD-10-CM | POA: Diagnosis not present

## 2012-07-06 DIAGNOSIS — M961 Postlaminectomy syndrome, not elsewhere classified: Secondary | ICD-10-CM | POA: Diagnosis not present

## 2012-07-06 DIAGNOSIS — M542 Cervicalgia: Secondary | ICD-10-CM | POA: Diagnosis not present

## 2012-08-31 DIAGNOSIS — F411 Generalized anxiety disorder: Secondary | ICD-10-CM | POA: Diagnosis not present

## 2012-08-31 DIAGNOSIS — J309 Allergic rhinitis, unspecified: Secondary | ICD-10-CM | POA: Diagnosis not present

## 2012-08-31 DIAGNOSIS — R350 Frequency of micturition: Secondary | ICD-10-CM | POA: Diagnosis not present

## 2012-08-31 DIAGNOSIS — N5314 Retrograde ejaculation: Secondary | ICD-10-CM | POA: Diagnosis not present

## 2012-08-31 DIAGNOSIS — N400 Enlarged prostate without lower urinary tract symptoms: Secondary | ICD-10-CM | POA: Diagnosis not present

## 2012-08-31 DIAGNOSIS — R5381 Other malaise: Secondary | ICD-10-CM | POA: Diagnosis not present

## 2012-10-05 DIAGNOSIS — M545 Low back pain, unspecified: Secondary | ICD-10-CM | POA: Diagnosis not present

## 2012-10-05 DIAGNOSIS — G894 Chronic pain syndrome: Secondary | ICD-10-CM | POA: Diagnosis not present

## 2012-10-05 DIAGNOSIS — M542 Cervicalgia: Secondary | ICD-10-CM | POA: Diagnosis not present

## 2012-12-07 DIAGNOSIS — B029 Zoster without complications: Secondary | ICD-10-CM | POA: Diagnosis not present

## 2012-12-19 DIAGNOSIS — IMO0002 Reserved for concepts with insufficient information to code with codable children: Secondary | ICD-10-CM | POA: Diagnosis not present

## 2012-12-19 DIAGNOSIS — B029 Zoster without complications: Secondary | ICD-10-CM | POA: Diagnosis not present

## 2013-01-02 DIAGNOSIS — G894 Chronic pain syndrome: Secondary | ICD-10-CM | POA: Diagnosis not present

## 2013-01-02 DIAGNOSIS — M961 Postlaminectomy syndrome, not elsewhere classified: Secondary | ICD-10-CM | POA: Diagnosis not present

## 2013-02-05 ENCOUNTER — Emergency Department (HOSPITAL_COMMUNITY)
Admission: EM | Admit: 2013-02-05 | Discharge: 2013-02-05 | Disposition: A | Discharge status: Home / Self Care | Payer: BC Managed Care – PPO | Attending: Emergency Medicine | Admitting: Emergency Medicine

## 2013-02-05 ENCOUNTER — Emergency Department (HOSPITAL_COMMUNITY): Payer: BC Managed Care – PPO

## 2013-02-05 ENCOUNTER — Encounter (HOSPITAL_COMMUNITY): Payer: Self-pay | Admitting: Emergency Medicine

## 2013-02-05 DIAGNOSIS — R51 Headache: Secondary | ICD-10-CM | POA: Insufficient documentation

## 2013-02-05 DIAGNOSIS — R52 Pain, unspecified: Secondary | ICD-10-CM | POA: Diagnosis not present

## 2013-02-05 DIAGNOSIS — Z87448 Personal history of other diseases of urinary system: Secondary | ICD-10-CM | POA: Diagnosis not present

## 2013-02-05 DIAGNOSIS — Z9889 Other specified postprocedural states: Secondary | ICD-10-CM | POA: Insufficient documentation

## 2013-02-05 DIAGNOSIS — Z79899 Other long term (current) drug therapy: Secondary | ICD-10-CM | POA: Diagnosis not present

## 2013-02-05 DIAGNOSIS — M1990 Unspecified osteoarthritis, unspecified site: Secondary | ICD-10-CM | POA: Diagnosis not present

## 2013-02-05 DIAGNOSIS — J449 Chronic obstructive pulmonary disease, unspecified: Secondary | ICD-10-CM | POA: Insufficient documentation

## 2013-02-05 DIAGNOSIS — Z8719 Personal history of other diseases of the digestive system: Secondary | ICD-10-CM | POA: Diagnosis not present

## 2013-02-05 DIAGNOSIS — J4489 Other specified chronic obstructive pulmonary disease: Secondary | ICD-10-CM | POA: Insufficient documentation

## 2013-02-05 DIAGNOSIS — R1110 Vomiting, unspecified: Secondary | ICD-10-CM | POA: Insufficient documentation

## 2013-02-05 DIAGNOSIS — M069 Rheumatoid arthritis, unspecified: Secondary | ICD-10-CM | POA: Insufficient documentation

## 2013-02-05 DIAGNOSIS — Z8614 Personal history of Methicillin resistant Staphylococcus aureus infection: Secondary | ICD-10-CM | POA: Insufficient documentation

## 2013-02-05 DIAGNOSIS — Z880 Allergy status to penicillin: Secondary | ICD-10-CM | POA: Diagnosis not present

## 2013-02-05 DIAGNOSIS — J111 Influenza due to unidentified influenza virus with other respiratory manifestations: Secondary | ICD-10-CM

## 2013-02-05 DIAGNOSIS — Z87891 Personal history of nicotine dependence: Secondary | ICD-10-CM | POA: Diagnosis not present

## 2013-02-05 DIAGNOSIS — E079 Disorder of thyroid, unspecified: Secondary | ICD-10-CM | POA: Insufficient documentation

## 2013-02-05 LAB — POCT I-STAT, CHEM 8
Glucose, Bld: 114 mg/dL — ABNORMAL HIGH (ref 70–99)
HCT: 47 % (ref 39.0–52.0)
Hemoglobin: 16 g/dL (ref 13.0–17.0)
Potassium: 3.3 mEq/L — ABNORMAL LOW (ref 3.5–5.1)
Sodium: 135 mEq/L (ref 135–145)

## 2013-02-05 LAB — POCT I-STAT TROPONIN I

## 2013-02-05 MED ORDER — SODIUM CHLORIDE 0.9 % IV BOLUS (SEPSIS)
500.0000 mL | Freq: Once | INTRAVENOUS | Status: AC
  Administered 2013-02-05: 500 mL via INTRAVENOUS

## 2013-02-05 MED ORDER — ACETAMINOPHEN 325 MG PO TABS
650.0000 mg | ORAL_TABLET | Freq: Once | ORAL | Status: AC
  Administered 2013-02-05: 650 mg via ORAL
  Filled 2013-02-05: qty 2

## 2013-02-05 MED ORDER — IBUPROFEN 800 MG PO TABS
800.0000 mg | ORAL_TABLET | Freq: Once | ORAL | Status: AC
  Administered 2013-02-05: 800 mg via ORAL
  Filled 2013-02-05: qty 1

## 2013-02-05 MED ORDER — ALBUTEROL SULFATE (5 MG/ML) 0.5% IN NEBU
5.0000 mg | INHALATION_SOLUTION | Freq: Once | RESPIRATORY_TRACT | Status: AC
  Administered 2013-02-05: 5 mg via RESPIRATORY_TRACT
  Filled 2013-02-05: qty 1

## 2013-02-05 MED ORDER — PROCHLORPERAZINE EDISYLATE 5 MG/ML IJ SOLN
10.0000 mg | Freq: Once | INTRAMUSCULAR | Status: AC
  Administered 2013-02-05: 10 mg via INTRAVENOUS
  Filled 2013-02-05: qty 2

## 2013-02-05 NOTE — ED Provider Notes (Signed)
CSN: [id number]     Arrival date & time 02/05/13  4098 History  This chart was scribed for Hilario Quarry, MD by Smiley Houseman, ED Scribe. The patient was seen in room APA08/APA08. Patient's care was started at 7:17 AM.    Chief Complaint  Patient presents with  . Headache  . Nasal Congestion   The history is provided by the patient. No language interpreter was used.   HPI Comments: DARSH VANDEVOORT is a 62 y.o. male who presents to the Emergency Department complaining of constant moderate gradually worsening headache that started gradually about three days ago, with an associated  slight productive cough, subjective fever, chills, HA, generalized body aches, nasal congestion with yellow mucous, and 1 episode of emesis.  Pt denies sore throat and diarrhea.  He states he has taken Advil without relief.  Pt reports he has taken the flu shot this year.  Pt denies h/o of diabetes, HTN, migraines, but has had MRSA in the past.  Pt also has h/o of sinus surgeries. Pt has h/o of rheumatoid arthritis, osteoarthritis, and COPD.  Pt states he quit smoking about 15 years ago.  Pt also states he had a aneurysm about 40 years ago that has sealed.    Past Medical History  Diagnosis Date  . Insomnia   . Osteoarthritis   . Rheumatoid arteritis   . Inguinal hernia   . Aneurysm     POSTERIOR BRAIN - NO FOLLOW UP X 40 YRS  . PONV (postoperative nausea and vomiting)   . COPD (chronic obstructive pulmonary disease)   . Asthma   . Bruises easily   . H/O hiatal hernia   . Difficulty urinating   . Enlarged prostate   . Thyroid disease    Past Surgical History  Procedure Laterality Date  . Partial hip arthroplasty    . Replacement total knee    . Foot surgery      both feet  . Spine surgery    . Lumbar spine surgery    . Rotator cuff repair    . Carpal tunnel release    . Knee arthroscopy      Right knee  . Spinal cord stimulator implant  2010  . Esophageal stretching    . Nasal sinus surgery       X 3  . Inguinal hernia repair  01/28/2012    Procedure: LAPAROSCOPIC BILATERAL INGUINAL HERNIA REPAIR;  Surgeon: Axel Filler, MD;  Location: [facility];  Service: General;  Laterality: Bilateral;  . Insertion of mesh  01/28/2012    Procedure: INSERTION OF MESH;  Surgeon: Axel Filler, MD;  Location: [facility];  Service: General;  Laterality: Bilateral;   Family History  Problem Relation Age of Onset  . Cancer Sister   . Multiple sclerosis Sister   . Esophageal cancer Brother   . COPD Sister   . COPD Brother   . Lung cancer Mother    History  Substance Use Topics  . Smoking status: Former Smoker -- 3.00 packs/day for 40 years    Types: Cigarettes    Quit date: 02/08/1997  . Smokeless tobacco: Never Used  . Alcohol Use: No    Review of Systems  Constitutional: Positive for fever and chills.  HENT: Positive for congestion. Negative for rhinorrhea.   Respiratory: Positive for cough. Negative for shortness of breath.   Cardiovascular: Negative for chest pain.  Gastrointestinal: Positive for vomiting. Negative for nausea, abdominal pain and diarrhea.  Musculoskeletal: Positive for  myalgias. Negative for back pain.  Skin: Negative for color change and rash.  Neurological: Positive for headaches.  All other systems reviewed and are negative.    Allergies  Penicillins  Home Medications   Current Outpatient Rx  Name  Route  Sig  Dispense  Refill  . folic acid (FOLVITE) 1 MG tablet   Oral   Take 1 mg by mouth daily.         Marland Kitchen ibuprofen (ADVIL,MOTRIN) 200 MG tablet   Oral   Take 600-800 mg by mouth every 6 (six) hours as needed for moderate pain. Pain         . inFLIXimab (REMICADE) 100 MG injection   Intravenous   Inject 100 mg into the vein every 6 (six) weeks.         Marland Kitchen levothyroxine (SYNTHROID, LEVOTHROID) 75 MCG tablet   Oral   Take 75 mcg by mouth daily before breakfast.         . methotrexate (RHEUMATREX) 2.5 MG tablet   Oral   Take 25 mg by mouth  once a week. Caution:Chemotherapy. Protect from light. 10 pills every Thursday         . morphine (MSIR) 30 MG tablet   Oral   Take 30 mg by mouth 2 (two) times daily.          Marland Kitchen oxyCODONE-acetaminophen (PERCOCET) 10-325 MG per tablet   Oral   Take 1 tablet by mouth 3 (three) times daily as needed. Pain         . tamsulosin (FLOMAX) 0.4 MG CAPS   Oral   Take 0.4 mg by mouth daily.         Marland Kitchen zolpidem (AMBIEN) 5 MG tablet   Oral   Take 5 mg by mouth at bedtime as needed. Sleep          Triage Vitals: BP 138/82  Pulse 99  Temp(Src) 99.6 F (37.6 C) (Oral)  Resp 18  Ht 5\' 10"  (1.778 m)  Wt 185 lb (83.915 kg)  BMI 26.54 kg/m2  SpO2 94% Physical Exam  Nursing note and vitals reviewed. Constitutional: He is oriented to person, place, and time. He appears well-developed and well-nourished. No distress.  HENT:  Head: Normocephalic and atraumatic.  Right Ear: External ear normal.  Left Ear: External ear normal.  Nose: Nose normal.  Mouth/Throat: Oropharynx is clear and moist.  Eyes: Conjunctivae and EOM are normal. Pupils are equal, round, and reactive to light. Right eye exhibits no discharge. Left eye exhibits no discharge.  Neck: Normal range of motion. Neck supple. No tracheal deviation present.  Cardiovascular: Normal rate, regular rhythm, normal heart sounds and intact distal pulses.   Pulmonary/Chest: Effort normal. No respiratory distress. He has rhonchi (few scattered).  Abdominal: There is no tenderness.  Musculoskeletal: Normal range of motion. He exhibits no edema.  Neurological: He is alert and oriented to person, place, and time. No cranial nerve deficit.  Skin: Skin is warm and dry.  Psychiatric: He has a normal mood and affect. His behavior is normal. Judgment and thought content normal.    ED Course  Procedures (including critical care time) DIAGNOSTIC STUDIES: Oxygen Saturation is 94% on RA, adequate by my interpretation.    COORDINATION OF  CARE: 7:24 AM-Will order CT of head and chest X-Koleton Duchemin. Will order breathing treatment. Patient informed of current plan of treatment and evaluation and agrees with plan.    Labs Review Labs Reviewed  POCT I-STAT, CHEM 8 - Abnormal;  Notable for the following:    Potassium 3.3 (*)    Glucose, Bld 114 (*)    All other components within normal limits  POCT I-STAT TROPONIN I   Imaging Review Dg Chest 2 View  02/05/2013   CLINICAL DATA:  Cough.  EXAM: CHEST  2 VIEW  COMPARISON:  09/17/2011.  FINDINGS: Mediastinum and hilar structures are normal. Stable interstitial prominence noted suggesting interstitial fibrosis. No pneumothorax or pleural effusion. Heart size normal. Pulmonary vascularity normal. A neurostimulator noted in stable position.  IMPRESSION: 1. Changes consistent with interstitial fibrosis. No acute cardiopulmonary disease. 2. Neurostimulator noted in stable position projected over the thoracic spine.   Electronically Signed   By: Maisie Fus  Register   On: 02/05/2013 09:00   Ct Head Wo Contrast  02/05/2013   CLINICAL DATA:  Headache  EXAM: CT HEAD WITHOUT CONTRAST  TECHNIQUE: Contiguous axial images were obtained from the base of the skull through the vertex without intravenous contrast.  COMPARISON:  None.  FINDINGS: There is no evidence of acute hemorrhage nor mass effect. Diffuse areas of low attenuation project within the subcortical, deep and periventricular white matter regions. Focal confluent regions project along periphery of the left lateral ventricle anterior and posteriorly. These findings have the appearance of chronic regions of the lacunar infarction. There is mild diffuse cortical atrophy. The ventricles and cisterns are patent. The osseous structures demonstrate no evidence of a depressed skull fracture. Mucosal thickening and air-fluid levels identified within the visualized portions of the maxillary sinuses. Postsurgical changes are identified within the maxillary sinuses.  There is diffuse mucosal thickening and areas of opacifications within the ethmoid air cells. The mastoid air cells are patent. An air-fluid level is appreciated within the sphenoid air cell on the left.  IMPRESSION: Chronic and involutional intracranial findings without evidence of focal or acute abnormalities. There are findings also consistent with pan sinus disease.   Electronically Signed   By: Salome Holmes M.D.   On: 02/05/2013 09:15    EKG Interpretation    Date/Time:  Monday February 05 2013 08:00:07 EST Ventricular Rate:  95 PR Interval:  150 QRS Duration: 94 QT Interval:  354 QTC Calculation: 444 R Axis:   45 Text Interpretation:  Normal sinus rhythm Possible Left atrial enlargement Borderline ECG When compared with ECG of 15-Oct-2008 11:39, Vent. rate has increased BY  44 BPM Confirmed by Essie Lagunes MD, Gurnoor Ursua ([id number]) on 02/05/2013 8:33:47 AM            MDM  No diagnosis found.   We associate anuerysm HA with sudden severe onset coming with exertion, none of which he had.  No signs of a meningitis HA and does not have weakness on either side.   62 year old male with influenza-like illness he presents today complaining of cough, headache, and nasal congestion. He is on Remicade for rheumatoid arthritis and here appears stable without any signs of acute bacterial infection. He is treated with IV fluids, acetaminophen, ibuprofen, and Compazine for her headache. He is on large doses of narcotics for chronic pain management via pain management doctor in [place]. He is advised to control his fever and symptoms with acetaminophen that is advised regarding combined medicines such as Percocet do not exceed dose of 650 mg of Tylenol per 4 hours. Patient is given return precautions and advised regarding followup.  I personally performed the services described in this documentation, which was scribed in my presence. The recorded information has been reviewed and  considered.  Hilario Quarry, MD 02/05/13 [phone]

## 2013-02-05 NOTE — ED Notes (Addendum)
Pt. States that he had a gradual onset headache 10/10 starting Friday night that eased up but became worse again this morning. Pt. Reports dizziness and shortness of breath. Pt. States that he was told he had an brain aneurysm 40 years ago and testing showed that it sealed itself. Pt. Reports nasal congestion and pain when coughing. Pt. Reports intermittent nausea. Pt. Reports taking advil migraine with no relief.

## 2013-02-05 NOTE — ED Notes (Signed)
EDP at bedside  

## 2013-02-07 DIAGNOSIS — J438 Other emphysema: Secondary | ICD-10-CM | POA: Diagnosis not present

## 2013-02-07 DIAGNOSIS — J0100 Acute maxillary sinusitis, unspecified: Secondary | ICD-10-CM | POA: Diagnosis not present

## 2013-02-07 DIAGNOSIS — J209 Acute bronchitis, unspecified: Secondary | ICD-10-CM | POA: Diagnosis not present

## 2013-02-08 HISTORY — PX: EYE SURGERY: SHX253

## 2013-02-09 ENCOUNTER — Inpatient Hospital Stay (HOSPITAL_COMMUNITY)
Admission: EM | Admit: 2013-02-09 | Discharge: 2013-02-11 | DRG: 190 | Disposition: A | Discharge status: Home / Self Care | Payer: BC Managed Care – PPO | Attending: Internal Medicine | Admitting: Internal Medicine

## 2013-02-09 ENCOUNTER — Encounter (HOSPITAL_COMMUNITY): Payer: Self-pay | Admitting: Emergency Medicine

## 2013-02-09 ENCOUNTER — Emergency Department (HOSPITAL_COMMUNITY): Payer: BC Managed Care – PPO

## 2013-02-09 ENCOUNTER — Telehealth: Payer: Self-pay | Admitting: Emergency Medicine

## 2013-02-09 DIAGNOSIS — G4700 Insomnia, unspecified: Secondary | ICD-10-CM | POA: Diagnosis present

## 2013-02-09 DIAGNOSIS — E039 Hypothyroidism, unspecified: Secondary | ICD-10-CM | POA: Diagnosis present

## 2013-02-09 DIAGNOSIS — M545 Low back pain, unspecified: Secondary | ICD-10-CM | POA: Diagnosis present

## 2013-02-09 DIAGNOSIS — J4489 Other specified chronic obstructive pulmonary disease: Secondary | ICD-10-CM | POA: Diagnosis present

## 2013-02-09 DIAGNOSIS — R0602 Shortness of breath: Secondary | ICD-10-CM

## 2013-02-09 DIAGNOSIS — M549 Dorsalgia, unspecified: Secondary | ICD-10-CM | POA: Diagnosis present

## 2013-02-09 DIAGNOSIS — J189 Pneumonia, unspecified organism: Secondary | ICD-10-CM | POA: Diagnosis present

## 2013-02-09 DIAGNOSIS — Z87891 Personal history of nicotine dependence: Secondary | ICD-10-CM

## 2013-02-09 DIAGNOSIS — J441 Chronic obstructive pulmonary disease with (acute) exacerbation: Secondary | ICD-10-CM | POA: Diagnosis present

## 2013-02-09 DIAGNOSIS — Z96659 Presence of unspecified artificial knee joint: Secondary | ICD-10-CM | POA: Diagnosis not present

## 2013-02-09 DIAGNOSIS — M069 Rheumatoid arthritis, unspecified: Secondary | ICD-10-CM | POA: Diagnosis present

## 2013-02-09 DIAGNOSIS — M1990 Unspecified osteoarthritis, unspecified site: Secondary | ICD-10-CM | POA: Diagnosis present

## 2013-02-09 DIAGNOSIS — G8929 Other chronic pain: Secondary | ICD-10-CM | POA: Diagnosis present

## 2013-02-09 DIAGNOSIS — D849 Immunodeficiency, unspecified: Secondary | ICD-10-CM | POA: Diagnosis present

## 2013-02-09 DIAGNOSIS — Z79899 Other long term (current) drug therapy: Secondary | ICD-10-CM | POA: Diagnosis not present

## 2013-02-09 DIAGNOSIS — J209 Acute bronchitis, unspecified: Secondary | ICD-10-CM | POA: Diagnosis present

## 2013-02-09 DIAGNOSIS — J40 Bronchitis, not specified as acute or chronic: Secondary | ICD-10-CM

## 2013-02-09 DIAGNOSIS — Z880 Allergy status to penicillin: Secondary | ICD-10-CM | POA: Diagnosis not present

## 2013-02-09 DIAGNOSIS — J438 Other emphysema: Secondary | ICD-10-CM | POA: Diagnosis not present

## 2013-02-09 DIAGNOSIS — J45901 Unspecified asthma with (acute) exacerbation: Principal | ICD-10-CM

## 2013-02-09 DIAGNOSIS — J45909 Unspecified asthma, uncomplicated: Secondary | ICD-10-CM | POA: Diagnosis present

## 2013-02-09 DIAGNOSIS — J449 Chronic obstructive pulmonary disease, unspecified: Secondary | ICD-10-CM | POA: Diagnosis present

## 2013-02-09 HISTORY — DX: Fibromyalgia: M79.7

## 2013-02-09 HISTORY — DX: Pneumonia, unspecified organism: J18.9

## 2013-02-09 HISTORY — DX: Inflammatory liver disease, unspecified: K75.9

## 2013-02-09 HISTORY — DX: Other chronic pain: G89.29

## 2013-02-09 HISTORY — DX: Hypothyroidism, unspecified: E03.9

## 2013-02-09 HISTORY — DX: Shortness of breath: R06.02

## 2013-02-09 HISTORY — DX: Unspecified chronic bronchitis: J42

## 2013-02-09 HISTORY — DX: Headache, unspecified: R51.9

## 2013-02-09 HISTORY — DX: Emphysema, unspecified: J43.9

## 2013-02-09 HISTORY — DX: Headache: R51

## 2013-02-09 HISTORY — DX: Dorsalgia, unspecified: M54.9

## 2013-02-09 LAB — CBC
HCT: 42.8 % (ref 39.0–52.0)
HEMATOCRIT: 42.5 % (ref 39.0–52.0)
HEMOGLOBIN: 14.9 g/dL (ref 13.0–17.0)
Hemoglobin: 14.9 g/dL (ref 13.0–17.0)
MCH: 33.9 pg (ref 26.0–34.0)
MCH: 34.9 pg — AB (ref 26.0–34.0)
MCHC: 34.8 g/dL (ref 30.0–36.0)
MCHC: 35.1 g/dL (ref 30.0–36.0)
MCV: 97.5 fL (ref 78.0–100.0)
MCV: 99.5 fL (ref 78.0–100.0)
Platelets: 162 10*3/uL (ref 150–400)
Platelets: 156 10*3/uL (ref 150–400)
RBC: 4.39 MIL/uL (ref 4.22–5.81)
RBC: 4.27 MIL/uL (ref 4.22–5.81)
RDW: 13.6 % (ref 11.5–15.5)
RDW: 13.8 % (ref 11.5–15.5)
WBC: 9.9 10*3/uL (ref 4.0–10.5)
WBC: 9 10*3/uL (ref 4.0–10.5)

## 2013-02-09 LAB — HEPATIC FUNCTION PANEL
ALBUMIN: 3.5 g/dL (ref 3.5–5.2)
ALK PHOS: 56 U/L (ref 39–117)
ALT: 19 U/L (ref 0–53)
AST: 30 U/L (ref 0–37)
BILIRUBIN TOTAL: 0.6 mg/dL (ref 0.3–1.2)
Bilirubin, Direct: <0.2 mg/dL (ref 0.0–0.3)
Total Protein: 7.8 g/dL (ref 6.0–8.3)

## 2013-02-09 LAB — INFLUENZA PANEL BY PCR (TYPE A & B)
H1N1 flu by pcr: NOT DETECTED
INFLBPCR: NEGATIVE
Influenza A By PCR: NEGATIVE

## 2013-02-09 LAB — PRO B NATRIURETIC PEPTIDE: Pro B Natriuretic peptide (BNP): 197.4 pg/mL — ABNORMAL HIGH (ref 0–125)

## 2013-02-09 LAB — POCT I-STAT TROPONIN I: Troponin i, poc: 0 ng/mL (ref 0.00–0.08)

## 2013-02-09 LAB — BASIC METABOLIC PANEL
BUN: 20 mg/dL (ref 6–23)
CO2: 31 mEq/L (ref 19–32)
CREATININE: 0.6 mg/dL (ref 0.50–1.35)
Calcium: 9.1 mg/dL (ref 8.4–10.5)
Chloride: 95 mEq/L — ABNORMAL LOW (ref 96–112)
GFR calc Af Amer: >90 mL/min (ref >90)
GFR calc non Af Amer: >90 mL/min (ref >90)
Glucose, Bld: 151 mg/dL — ABNORMAL HIGH (ref 70–99)
Potassium: 4.1 mEq/L (ref 3.7–5.3)
Sodium: 137 mEq/L (ref 137–147)

## 2013-02-09 LAB — D-DIMER, QUANTITATIVE: D-Dimer, Quant: 0.61 ug/mL-FEU — ABNORMAL HIGH (ref 0.00–0.48)

## 2013-02-09 LAB — TROPONIN I: Troponin I: <0.3 ng/mL (ref <0.30)

## 2013-02-09 LAB — MAGNESIUM: Magnesium: 2.3 mg/dL (ref 1.5–2.5)

## 2013-02-09 MED ORDER — ALBUTEROL SULFATE (2.5 MG/3ML) 0.083% IN NEBU
5.0000 mg | INHALATION_SOLUTION | Freq: Once | RESPIRATORY_TRACT | Status: AC
  Administered 2013-02-09: 5 mg via RESPIRATORY_TRACT
  Filled 2013-02-09: qty 6

## 2013-02-09 MED ORDER — ACETAMINOPHEN 325 MG PO TABS: 650.0000 mg | ORAL_TABLET | Freq: Four times a day (QID) | ORAL | Status: DC | PRN

## 2013-02-09 MED ORDER — IPRATROPIUM BROMIDE 0.02 % IN SOLN
0.5000 mg | Freq: Once | RESPIRATORY_TRACT | Status: AC
  Administered 2013-02-09: 0.5 mg via RESPIRATORY_TRACT
  Filled 2013-02-09: qty 2.5

## 2013-02-09 MED ORDER — SODIUM CHLORIDE 0.9 % IV SOLN
INTRAVENOUS | Status: AC
  Administered 2013-02-09: via INTRAVENOUS
  Administered 2013-02-09: 100 mL/h via INTRAVENOUS

## 2013-02-09 MED ORDER — LEVOTHYROXINE SODIUM 75 MCG PO TABS
75.0000 ug | ORAL_TABLET | Freq: Every day | ORAL | Status: DC
  Administered 2013-02-10 – 2013-02-11 (×2): 75 ug via ORAL
  Filled 2013-02-09 (×3): qty 1

## 2013-02-09 MED ORDER — ONDANSETRON HCL 4 MG/2ML IJ SOLN: 4.0000 mg | Freq: Four times a day (QID) | INTRAMUSCULAR | Status: DC | PRN

## 2013-02-09 MED ORDER — PREDNISONE 20 MG PO TABS
60.0000 mg | ORAL_TABLET | Freq: Once | ORAL | Status: AC
  Administered 2013-02-09: 60 mg via ORAL
  Filled 2013-02-09: qty 3

## 2013-02-09 MED ORDER — SODIUM CHLORIDE 0.9 % IV BOLUS (SEPSIS)
1000.0000 mL | Freq: Once | INTRAVENOUS | Status: AC
  Administered 2013-02-09: 1000 mL via INTRAVENOUS

## 2013-02-09 MED ORDER — LEVOFLOXACIN IN D5W 500 MG/100ML IV SOLN
500.0000 mg | INTRAVENOUS | Status: DC
  Administered 2013-02-10: 500 mg via INTRAVENOUS
  Filled 2013-02-09 (×2): qty 100

## 2013-02-09 MED ORDER — GUAIFENESIN-DM 100-10 MG/5ML PO SYRP: 5.0000 mL | ORAL_SOLUTION | ORAL | Status: DC | PRN

## 2013-02-09 MED ORDER — OXYCODONE-ACETAMINOPHEN 5-325 MG PO TABS: 1.0000 | ORAL_TABLET | Freq: Three times a day (TID) | ORAL | Status: DC | PRN

## 2013-02-09 MED ORDER — PREDNISONE 50 MG PO TABS
50.0000 mg | ORAL_TABLET | Freq: Every day | ORAL | Status: DC
  Administered 2013-02-10 – 2013-02-11 (×2): 50 mg via ORAL
  Filled 2013-02-09 (×3): qty 1

## 2013-02-09 MED ORDER — SODIUM CHLORIDE 0.9 % IV SOLN
INTRAVENOUS | Status: AC
  Administered 2013-02-09: 19:00:00 via INTRAVENOUS

## 2013-02-09 MED ORDER — LEVALBUTEROL HCL 0.63 MG/3ML IN NEBU: 0.6300 mg | INHALATION_SOLUTION | Freq: Four times a day (QID) | RESPIRATORY_TRACT | Status: DC | PRN

## 2013-02-09 MED ORDER — OXYCODONE-ACETAMINOPHEN 5-325 MG PO TABS
2.0000 | ORAL_TABLET | Freq: Once | ORAL | Status: AC
  Administered 2013-02-09: 2 via ORAL
  Filled 2013-02-09: qty 2

## 2013-02-09 MED ORDER — FOLIC ACID 1 MG PO TABS
1.0000 mg | ORAL_TABLET | Freq: Every day | ORAL | Status: DC
  Administered 2013-02-09 – 2013-02-10 (×2): 1 mg via ORAL
  Filled 2013-02-09 (×3): qty 1

## 2013-02-09 MED ORDER — FOLIC ACID 1 MG PO TABS: 1.0000 mg | ORAL_TABLET | Freq: Every day | ORAL | Status: DC

## 2013-02-09 MED ORDER — OXYCODONE HCL 5 MG PO TABS: 5.0000 mg | ORAL_TABLET | Freq: Three times a day (TID) | ORAL | Status: DC | PRN

## 2013-02-09 MED ORDER — ACETAMINOPHEN 650 MG RE SUPP: 650.0000 mg | Freq: Four times a day (QID) | RECTAL | Status: DC | PRN

## 2013-02-09 MED ORDER — ENOXAPARIN SODIUM 40 MG/0.4ML ~~LOC~~ SOLN
40.0000 mg | SUBCUTANEOUS | Status: DC
  Administered 2013-02-09 – 2013-02-10 (×2): 40 mg via SUBCUTANEOUS
  Filled 2013-02-09 (×3): qty 0.4

## 2013-02-09 MED ORDER — MORPHINE SULFATE 15 MG PO TABS
30.0000 mg | ORAL_TABLET | Freq: Two times a day (BID) | ORAL | Status: DC
  Administered 2013-02-09 – 2013-02-10 (×3): 30 mg via ORAL
  Filled 2013-02-09 (×3): qty 2

## 2013-02-09 MED ORDER — SODIUM CHLORIDE 0.9 % IV SOLN: INTRAVENOUS | Status: DC

## 2013-02-09 MED ORDER — ONDANSETRON HCL 4 MG PO TABS: 4.0000 mg | ORAL_TABLET | Freq: Four times a day (QID) | ORAL | Status: DC | PRN

## 2013-02-09 MED ORDER — IPRATROPIUM BROMIDE 0.02 % IN SOLN: 0.5000 mg | RESPIRATORY_TRACT | Status: DC | PRN

## 2013-02-09 MED ORDER — LEVOFLOXACIN IN D5W 500 MG/100ML IV SOLN
500.0000 mg | Freq: Once | INTRAVENOUS | Status: AC
  Administered 2013-02-09: 500 mg via INTRAVENOUS
  Filled 2013-02-09: qty 100

## 2013-02-09 MED ORDER — OXYCODONE-ACETAMINOPHEN 10-325 MG PO TABS: 1.0000 | ORAL_TABLET | Freq: Three times a day (TID) | ORAL | Status: DC | PRN

## 2013-02-09 NOTE — Telephone Encounter (Signed)
Spoke with pt daughter-- recs given per MW Daughter states that she is going to speak with pt and see if he wants to make the trip to the ED or just go to his PCP I explained to the daughter that with the weekend approaching he really needed to be eval/admitted at ED Daughter to speak with pt and decide.   Nothing further needed. Will forward to RB as FYI.

## 2013-02-09 NOTE — ED Notes (Signed)
Pt reports that he has had continued congestion, SOB, cough, fever x 1 week without improvement since started on abx. Pt appears SOB, rhonchi throughout.

## 2013-02-09 NOTE — ED Notes (Signed)
Admitting MD at bedside.

## 2013-02-09 NOTE — Telephone Encounter (Signed)
Called and spoke with pts wife.  She stated that the pt needs to be seen today.  She stated that she took the pt to the ER on Monday and they dx pt with flu, but did not prescribe any meds to the pt.  They did give him a shot to help with his headache.  He was give cxr, ct, labs and then was sent home.    Pt was seen by Dr. Quintin Alto on Wednesday and was told that he had the symptoms of a sinus infection.  Dr. Quintin Alto did a double shot of steroids, inhaler (proair), and given cefprozil 500 mg  Bid x 10 days.   pts wife stated that today he is not better, still with low grade fever of 100.8, vomiting started today, weakness, chest congestion but not able to cough anything up---she started pt on mucinex.  She stated that the pt can barely walk from the bedroom to the living room due to being weak and sob.  No openings today.  RB is 3 pm elink,  MW please advise. Thanks  Allergies  Allergen Reactions  . Penicillins Nausea Only    REACTION: nausea     Current Outpatient Prescriptions on File Prior to Visit  Medication Sig Dispense Refill  . folic acid (FOLVITE) 1 MG tablet Take 1 mg by mouth daily.      Marland Kitchen ibuprofen (ADVIL,MOTRIN) 200 MG tablet Take 600-800 mg by mouth every 6 (six) hours as needed for moderate pain. Pain      . inFLIXimab (REMICADE) 100 MG injection Inject 100 mg into the vein every 6 (six) weeks.      Marland Kitchen levothyroxine (SYNTHROID, LEVOTHROID) 75 MCG tablet Take 75 mcg by mouth daily before breakfast.      . methotrexate (RHEUMATREX) 2.5 MG tablet Take 25 mg by mouth once a week. Caution:Chemotherapy. Protect from light. 10 pills every Thursday      . morphine (MSIR) 30 MG tablet Take 30 mg by mouth 2 (two) times daily.       Marland Kitchen oxyCODONE-acetaminophen (PERCOCET) 10-325 MG per tablet Take 1 tablet by mouth 3 (three) times daily as needed. Pain      . tamsulosin (FLOMAX) 0.4 MG CAPS Take 0.4 mg by mouth daily.      Marland Kitchen zolpidem (AMBIEN) 5 MG tablet Take 5 mg by mouth at bedtime as needed.  Sleep       No current facility-administered medications on file prior to visit.

## 2013-02-09 NOTE — H&P (Addendum)
[facility] History and Physical  ASAPH SERENA NWG:[id number] DOB: Mar 20, 1950 DOA: 02/09/2013  Referring physician: ER  PCP: Manon Hilding, MD   Chief Complaint: Shortness of breath  HPI:  63 year old male with a history of rheumatoid arthritis presents with cough congestion, low-grade fever 100.8 at home, sore throat, headache, dizziness for the last one week. Patient reported a headache when he was seen in the ER on 12/29 at [facility]. He had a chest x-ray that showed interstitial fibrosis. CT scan of the head showed pansinus disease. Subsequently seen by Dr. Quintin Alto, diagnosed with acute sinusitis, prescribed inhaler (proair), and given cefprozil 500 mg Bid x 10 days. According to the wife and the daughter the patient is progressively becoming weak, can barely walk from the bedroom to the living room because of weakness and shortness of breath. The patient is being admitted because he is immunocompromised and is infliximab methotrexate for his rheumatoid arthritis. Today her chest x-ray does not show any acute infiltrate or pulmonary edema. No white count.       Review of Systems: negative for the following  Review of Systems  Constitutional: Positive for fever and chills.  HENT: Positive for congestion. Negative for rhinorrhea.  Respiratory: Positive for cough. Negative for shortness of breath.  Cardiovascular: Negative for chest pain.  Gastrointestinal: Positive for vomiting. Negative for nausea, abdominal pain and diarrhea.  Musculoskeletal: Positive for myalgias. Negative for back pain.  Skin: Negative for color change and rash.  Neurological: Positive for headaches.  All other systems reviewed and are negative.         Past Medical History  Diagnosis Date  . Insomnia   . Osteoarthritis   . Rheumatoid arteritis   . Inguinal hernia   . Aneurysm     POSTERIOR BRAIN - NO FOLLOW UP X 40 YRS  . PONV (postoperative nausea and vomiting)   . COPD (chronic  obstructive pulmonary disease)   . Asthma   . Bruises easily   . H/O hiatal hernia   . Difficulty urinating   . Enlarged prostate   . Thyroid disease      Past Surgical History  Procedure Laterality Date  . Partial hip arthroplasty    . Replacement total knee    . Foot surgery      both feet  . Spine surgery    . Lumbar spine surgery    . Rotator cuff repair    . Carpal tunnel release    . Knee arthroscopy      Right knee  . Spinal cord stimulator implant  2010  . Esophageal stretching    . Nasal sinus surgery      X 3  . Inguinal hernia repair  01/28/2012    Procedure: LAPAROSCOPIC BILATERAL INGUINAL HERNIA REPAIR;  Surgeon: Ralene Ok, MD;  Location: [facility];  Service: General;  Laterality: Bilateral;  . Insertion of mesh  01/28/2012    Procedure: INSERTION OF MESH;  Surgeon: Ralene Ok, MD;  Location: [facility];  Service: General;  Laterality: Bilateral;      Social History:  reports that he quit smoking about 16 years ago. His smoking use included Cigarettes. He has a 120 pack-year smoking history. He has never used smokeless tobacco. He reports that he does not drink alcohol or use illicit drugs.  Of   Allergies  Allergen Reactions  . Penicillins Nausea Only    REACTION: nausea    Family History  Problem Relation Age of Onset  .  Cancer Sister   . Multiple sclerosis Sister   . Esophageal cancer Brother   . COPD Sister   . COPD Brother   . Lung cancer Mother      Prior to Admission medications   Medication Sig Start Date End Date Taking? Authorizing Provider  cefPROZIL (CEFZIL) 500 MG tablet Take 500 mg by mouth 2 (two) times daily. 02/07/13  Yes Historical Provider, MD  folic acid (FOLVITE) 1 MG tablet Take 1 mg by mouth daily.   Yes Historical Provider, MD  inFLIXimab (REMICADE) 100 MG injection Inject 100 mg into the vein every 6 (six) weeks.   Yes Historical Provider, MD  levothyroxine (SYNTHROID, LEVOTHROID) 75 MCG tablet Take 75 mcg by mouth  daily before breakfast.   Yes Historical Provider, MD  morphine (MSIR) 30 MG tablet Take 30 mg by mouth 2 (two) times daily.    Yes Historical Provider, MD  oxyCODONE-acetaminophen (PERCOCET) 10-325 MG per tablet Take 1 tablet by mouth 3 (three) times daily as needed for pain. Pain 11/11/11  Yes Historical Provider, MD  zolpidem (AMBIEN) 5 MG tablet Take 5 mg by mouth at bedtime as needed. Sleep   Yes Historical Provider, MD  methotrexate (RHEUMATREX) 2.5 MG tablet Take 25 mg by mouth once a week. Caution:Chemotherapy. Protect from light. 10 pills every Thursday    Historical Provider, MD     Physical Exam: Filed Vitals:   02/09/13 1321 02/09/13 1522 02/09/13 1530  BP: 147/86 141/68 131/72  Pulse: 86 79 75  Temp: 97.7 F (36.5 C) 98.5 F (36.9 C)   TempSrc: Oral Oral   Resp: 24 18 12   SpO2: 88% 95% 94%     Constitutional: Vital signs reviewed. Patient is a well-developed and well-nourished in no acute distress and cooperative with exam. Alert and oriented x3.  Head: Normocephalic and atraumatic  Ear: TM normal bilaterally  Mouth: no erythema or exudates, MMM  Eyes: PERRL, EOMI, conjunctivae normal, No scleral icterus.  Neck: Supple, Trachea midline normal ROM, No JVD, mass, thyromegaly, or carotid bruit present.  Cardiovascular: RRR, S1 normal, S2 normal, no MRG, pulses symmetric and intact bilaterally  Pulmonary/Chest: By basilar rhonchi Abdominal: Soft. Non-tender, non-distended, bowel sounds are normal, no masses, organomegaly, or guarding present.  GU: no CVA tenderness Musculoskeletal: No joint deformities, erythema, or stiffness, ROM full and no nontender Ext: no edema and no cyanosis, pulses palpable bilaterally (DP and PT)  Hematology: no cervical, inginal, or axillary adenopathy.  Neurological: A&O x3, Strenght is normal and symmetric bilaterally, cranial nerve II-XII are grossly intact, no focal motor deficit, sensory intact to light touch bilaterally.  Skin: Warm, dry  and intact. No rash, cyanosis, or clubbing.  Psychiatric: Normal mood and affect. speech and behavior is normal. Judgment and thought content normal. Cognition and memory are normal.       Labs on Admission:    Basic Metabolic Panel:  Recent Labs Lab 02/05/13 0828 02/09/13 1400  NA 135 137  K 3.3* 4.1  CL 97 95*  CO2  --  31  GLUCOSE 114* 151*  BUN 20 20  CREATININE 0.70 0.60  CALCIUM  --  9.1   Liver Function Tests: No results found for this basename: AST, ALT, ALKPHOS, BILITOT, PROT, ALBUMIN,  in the last 168 hours No results found for this basename: LIPASE, AMYLASE,  in the last 168 hours No results found for this basename: AMMONIA,  in the last 168 hours CBC:  Recent Labs Lab 02/05/13 0828 02/09/13 1400  WBC  --  9.9  HGB 16.0 14.9  HCT 47.0 42.8  MCV  --  97.5  PLT  --  162   Cardiac Enzymes: No results found for this basename: CKTOTAL, CKMB, CKMBINDEX, TROPONINI,  in the last 168 hours  BNP (last 3 results)  Recent Labs  02/09/13 1400  PROBNP 197.4*      CBG: No results found for this basename: GLUCAP,  in the last 168 hours  Radiological Exams on Admission: Dg Chest 2 View  02/09/2013   CLINICAL DATA:  Cough, fever, shortness of Breath  EXAM: CHEST  2 VIEW  COMPARISON:  02/05/2013  FINDINGS: Cardiomediastinal silhouette is stable. No acute infiltrate or pulmonary edema. Mild degenerative changes thoracic spine again noted. Mild hyperinflation. Stable chronic mild interstitial prominence. Central mild bronchitic changes.  IMPRESSION: No acute infiltrate or pulmonary edema. Central mild bronchitic changes. Mild hyperinflation. Probable chronic mild interstitial prominence.   Electronically Signed   By: Lahoma Crocker M.D.   On: 02/09/2013 14:01    EKG: Date/Time: Friday February 09 2013 13:23:38 EST  Ventricular Rate: 87  PR Interval: 146  QRS Duration: 84  QT Interval: 368  QTC Calculation: 442  R Axis: 72  Text Interpretation: Normal sinus rhythm  Biatrial enlargement No significant change    Assessment/Plan Active Problems:   COPD (chronic obstructive pulmonary disease)   Acute bronchitis   Acute bronchitis, rule out influenza AB History of childhood asthma Previous smoker Patient has chronic dyspnea on exertion because of his RA His outpatient pulmonologist is Collene Gobble, MD Influenza PCR pending We'll start the patient on prednisone, levofloxacin Given his chronic shortness of breath we'll order a 2-D echo, d-dimer Infliximab increases risk of pulmonary embolism If elevated the patient will need a CT PE protocol   Rheumatoid arthritis Hold methotrexate, infliximab   Chronic pain Wife reports multiple surgeries in the past on his ankle and hip Patient has nerve stimulator in his back Continue Percocet, morphine sulfate immediate release    Code Status:   full Family Communication: bedside Disposition Plan: Observation, may be able to discharge him in the morning Time spent: 70 mins   [facility] Pager [phone]  If 7PM-7AM, please contact night-coverage [email or web address] Password [facility] 02/09/2013, 4:58 PM

## 2013-02-09 NOTE — ED Provider Notes (Addendum)
CSN: [id number]     Arrival date & time 02/09/13  1312 History   First MD Initiated Contact with Patient 02/09/13 1341     Chief Complaint  Patient presents with  . Shortness of Breath   (Consider location/radiation/quality/duration/timing/severity/associated sxs/prior Treatment) HPI Comments: Seen in ED 5 days ago and ddx with flu like illness.  Saw PCP 2 days ago and given cefzil for sinus infection but pt is getting worse.  Patient is a 63 y.o. male presenting with shortness of breath. The history is provided by the patient.  Shortness of Breath Severity:  Severe Onset quality:  Gradual Duration:  7 days Timing:  Constant Progression:  Worsening Chronicity:  New Context: URI   Relieved by:  Nothing Worsened by:  Activity, movement and exertion Ineffective treatments:  Inhaler and rest Associated symptoms: cough, fever, sore throat, sputum production, vomiting and wheezing   Associated symptoms: no abdominal pain and no chest pain   Risk factors: no tobacco use   Risk factors comment:  On remicade and mtx for RA.  COPD   Past Medical History  Diagnosis Date  . Insomnia   . Osteoarthritis   . Rheumatoid arteritis   . Inguinal hernia   . Aneurysm     POSTERIOR BRAIN - NO FOLLOW UP X 40 YRS  . PONV (postoperative nausea and vomiting)   . COPD (chronic obstructive pulmonary disease)   . Asthma   . Bruises easily   . H/O hiatal hernia   . Difficulty urinating   . Enlarged prostate   . Thyroid disease    Past Surgical History  Procedure Laterality Date  . Partial hip arthroplasty    . Replacement total knee    . Foot surgery      both feet  . Spine surgery    . Lumbar spine surgery    . Rotator cuff repair    . Carpal tunnel release    . Knee arthroscopy      Right knee  . Spinal cord stimulator implant  2010  . Esophageal stretching    . Nasal sinus surgery      X 3  . Inguinal hernia repair  01/28/2012    Procedure: LAPAROSCOPIC BILATERAL INGUINAL HERNIA  REPAIR;  Surgeon: Ralene Ok, MD;  Location: [facility];  Service: General;  Laterality: Bilateral;  . Insertion of mesh  01/28/2012    Procedure: INSERTION OF MESH;  Surgeon: Ralene Ok, MD;  Location: [facility];  Service: General;  Laterality: Bilateral;   Family History  Problem Relation Age of Onset  . Cancer Sister   . Multiple sclerosis Sister   . Esophageal cancer Brother   . COPD Sister   . COPD Brother   . Lung cancer Mother    History  Substance Use Topics  . Smoking status: Former Smoker -- 3.00 packs/day for 40 years    Types: Cigarettes    Quit date: 02/08/1997  . Smokeless tobacco: Never Used  . Alcohol Use: No    Review of Systems  Constitutional: Positive for fever.  HENT: Positive for sore throat.   Respiratory: Positive for cough, sputum production, shortness of breath and wheezing.   Cardiovascular: Negative for chest pain.  Gastrointestinal: Positive for vomiting. Negative for abdominal pain.  All other systems reviewed and are negative.    Allergies  Penicillins  Home Medications   Current Outpatient Rx  Name  Route  Sig  Dispense  Refill  . folic acid (FOLVITE) 1 MG tablet  Oral   Take 1 mg by mouth daily.         Marland Kitchen ibuprofen (ADVIL,MOTRIN) 200 MG tablet   Oral   Take 600-800 mg by mouth every 6 (six) hours as needed for moderate pain. Pain         . inFLIXimab (REMICADE) 100 MG injection   Intravenous   Inject 100 mg into the vein every 6 (six) weeks.         Marland Kitchen levothyroxine (SYNTHROID, LEVOTHROID) 75 MCG tablet   Oral   Take 75 mcg by mouth daily before breakfast.         . methotrexate (RHEUMATREX) 2.5 MG tablet   Oral   Take 25 mg by mouth once a week. Caution:Chemotherapy. Protect from light. 10 pills every Thursday         . morphine (MSIR) 30 MG tablet   Oral   Take 30 mg by mouth 2 (two) times daily.          Marland Kitchen oxyCODONE-acetaminophen (PERCOCET) 10-325 MG per tablet   Oral   Take 1 tablet by mouth 3  (three) times daily as needed. Pain         . tamsulosin (FLOMAX) 0.4 MG CAPS   Oral   Take 0.4 mg by mouth daily.         Marland Kitchen zolpidem (AMBIEN) 5 MG tablet   Oral   Take 5 mg by mouth at bedtime as needed. Sleep          BP 147/86  Pulse 86  Temp(Src) 97.7 F (36.5 C) (Oral)  Resp 24  SpO2 88% Physical Exam  Nursing note and vitals reviewed. Constitutional: He is oriented to person, place, and time. He appears well-developed and well-nourished. No distress.  HENT:  Head: Normocephalic and atraumatic.  Mouth/Throat: Oropharynx is clear and moist. Mucous membranes are dry.  Eyes: Conjunctivae and EOM are normal. Pupils are equal, round, and reactive to light.  Neck: Normal range of motion. Neck supple.  Cardiovascular: Normal rate, regular rhythm and intact distal pulses.   No murmur heard. Pulmonary/Chest: Tachypnea noted. No respiratory distress. He has wheezes. He has rhonchi. He has no rales.  Abdominal: Soft. He exhibits no distension. There is no tenderness. There is no rebound and no guarding.  Musculoskeletal: Normal range of motion. He exhibits no edema and no tenderness.  Neurological: He is alert and oriented to person, place, and time.  Skin: Skin is warm and dry. No rash noted. No erythema.  Psychiatric: He has a normal mood and affect. His behavior is normal.    ED Course  Procedures (including critical care time) Labs Review Labs Reviewed  BASIC METABOLIC PANEL - Abnormal; Notable for the following:    Chloride 95 (*)    Glucose, Bld 151 (*)    All other components within normal limits  PRO B NATRIURETIC PEPTIDE - Abnormal; Notable for the following:    Pro B Natriuretic peptide (BNP) 197.4 (*)    All other components within normal limits  CBC  INFLUENZA PANEL BY PCR  POCT I-STAT TROPONIN I   Imaging Review Dg Chest 2 View  02/09/2013   CLINICAL DATA:  Cough, fever, shortness of Breath  EXAM: CHEST  2 VIEW  COMPARISON:  02/05/2013  FINDINGS:  Cardiomediastinal silhouette is stable. No acute infiltrate or pulmonary edema. Mild degenerative changes thoracic spine again noted. Mild hyperinflation. Stable chronic mild interstitial prominence. Central mild bronchitic changes.  IMPRESSION: No acute infiltrate or pulmonary edema.  Central mild bronchitic changes. Mild hyperinflation. Probable chronic mild interstitial prominence.   Electronically Signed   By: Lahoma Crocker M.D.   On: 02/09/2013 14:01    EKG Interpretation    Date/Time:  Friday February 09 2013 13:23:38 EST Ventricular Rate:  87 PR Interval:  146 QRS Duration: 84 QT Interval:  368 QTC Calculation: 442 R Axis:   72 Text Interpretation:  Normal sinus rhythm Biatrial enlargement No significant change since last tracing Confirmed by Maryan Rued  MD, Brenyn Petrey ([id number]) on 02/09/2013 1:41:40 PM            MDM   1. COPD exacerbation   2. Bronchitis     Patient here like has not been ongoing for 7 days. He was seen initially 5 days ago for cough, congestion and fever and told he had the flu. He then saw his doctor 2 days ago and was started on Cefzil for a sinus infection. However he continues to have worsening cough, shortness of breath and returned today with oxygen saturation of 88% on room air with coarse breath sounds diffusely in mild to p.m. EKG unrevealing however patient is immune compromised and on Remicade as well as methotrexate for rheumatoid arthritis.    Concern for influenza with complicating features immunocompromise versus pneumonia versus exacerbation.  he has not been tested for influenza.  EKG without acute findings. Chest x-ray with bronchitic changes without acute infiltrate.  CBC, BMP, BNP pending. Patient given albuterol, Atrovent and prednisone.  3:17 PM Some improvement with albuterol/atrovent.  Will also cover with levaquin.  Will admit for further care.  Blanchie Dessert, MD 02/09/13 1518  Blanchie Dessert, MD 02/09/13 1627

## 2013-02-09 NOTE — Telephone Encounter (Signed)
Esp with vomiting there's nothing I can offer and should go to [facility] or [facility] er for eval for admit

## 2013-02-09 NOTE — ED Notes (Signed)
Pt c/o URI sx with cough, congestion and body aches x 5 days; pt seen at [facility] on Monday and told had flu; pt sts saw PCP and started on antibiotics on Wednesday but not having improvement at present; pt labored with rhonchi noted at present

## 2013-02-10 ENCOUNTER — Encounter (HOSPITAL_COMMUNITY): Payer: Self-pay | Admitting: *Deleted

## 2013-02-10 ENCOUNTER — Inpatient Hospital Stay (HOSPITAL_COMMUNITY): Payer: BC Managed Care – PPO

## 2013-02-10 DIAGNOSIS — R0602 Shortness of breath: Secondary | ICD-10-CM

## 2013-02-10 DIAGNOSIS — J189 Pneumonia, unspecified organism: Secondary | ICD-10-CM | POA: Diagnosis present

## 2013-02-10 DIAGNOSIS — J441 Chronic obstructive pulmonary disease with (acute) exacerbation: Secondary | ICD-10-CM

## 2013-02-10 LAB — CBC
HEMATOCRIT: 39 % (ref 39.0–52.0)
HEMOGLOBIN: 13.8 g/dL (ref 13.0–17.0)
MCH: 35.1 pg — ABNORMAL HIGH (ref 26.0–34.0)
MCHC: 35.4 g/dL (ref 30.0–36.0)
MCV: 99.2 fL (ref 78.0–100.0)
Platelets: 176 10*3/uL (ref 150–400)
RBC: 3.93 MIL/uL — AB (ref 4.22–5.81)
RDW: 13.6 % (ref 11.5–15.5)
WBC: 9.8 10*3/uL (ref 4.0–10.5)

## 2013-02-10 LAB — COMPREHENSIVE METABOLIC PANEL
ALBUMIN: 3 g/dL — AB (ref 3.5–5.2)
ALT: 22 U/L (ref 0–53)
AST: 32 U/L (ref 0–37)
Alkaline Phosphatase: 57 U/L (ref 39–117)
BUN: 20 mg/dL (ref 6–23)
CALCIUM: 8.7 mg/dL (ref 8.4–10.5)
CO2: 28 mEq/L (ref 19–32)
CREATININE: 0.58 mg/dL (ref 0.50–1.35)
Chloride: 99 mEq/L (ref 96–112)
GFR calc Af Amer: >90 mL/min (ref >90)
GFR calc non Af Amer: >90 mL/min (ref >90)
Glucose, Bld: 140 mg/dL — ABNORMAL HIGH (ref 70–99)
Potassium: 4.2 mEq/L (ref 3.7–5.3)
Sodium: 139 mEq/L (ref 137–147)
TOTAL PROTEIN: 7.2 g/dL (ref 6.0–8.3)
Total Bilirubin: 0.3 mg/dL (ref 0.3–1.2)

## 2013-02-10 LAB — TROPONIN I: Troponin I: <0.3 ng/mL (ref <0.30)

## 2013-02-10 LAB — HEMOGLOBIN A1C
Hgb A1c MFr Bld: 5.4 % (ref <5.7)
Mean Plasma Glucose: 108 mg/dL (ref <117)

## 2013-02-10 LAB — TSH: TSH: 2.76 u[IU]/mL (ref 0.350–4.500)

## 2013-02-10 MED ORDER — ZOLPIDEM TARTRATE 5 MG PO TABS
5.0000 mg | ORAL_TABLET | Freq: Every evening | ORAL | Status: DC | PRN
  Administered 2013-02-10 (×2): 5 mg via ORAL
  Filled 2013-02-10 (×2): qty 1

## 2013-02-10 MED ORDER — IOHEXOL 350 MG/ML SOLN
100.0000 mL | Freq: Once | INTRAVENOUS | Status: AC | PRN
  Administered 2013-02-10: 100 mL via INTRAVENOUS

## 2013-02-10 MED ORDER — OXYCODONE-ACETAMINOPHEN 5-325 MG PO TABS
2.0000 | ORAL_TABLET | Freq: Three times a day (TID) | ORAL | Status: DC | PRN
  Administered 2013-02-10 (×2): 2 via ORAL
  Filled 2013-02-10 (×2): qty 2

## 2013-02-10 NOTE — Progress Notes (Signed)
Echocardiogram 2D Echocardiogram has been performed.  Andrew [name] 02/10/2013, 11:35 AM

## 2013-02-10 NOTE — Progress Notes (Signed)
PROGRESS NOTE  Andrew [name] [id number] DOB: 1950/06/13 DOA: 02/09/2013 PCP: Manon Hilding, MD  Assessment/Plan: CAP - based on CT patient has RUL PNA. Continue Levofloxacin, feeling better this morning Cavitary nodule 1.3 cm RLL - per CT, will need close outpatient follow up 4-6 weeks. RA - outpatient follow up.  - continue home pain regimen Hypothyroidism - continue synthroid Progressive shortness of breath - his dyspnea preceded his PNA symptoms. D dimer elevated, CTPA negative for PE. 2D echo pending.  COPD - nebs as needed. Steroids, nebs.   Diet: regular Fluids: none DVT Prophylaxis: Lovenox  Code Status: Full Family Communication: wife bedside  Disposition Plan: home when ready  Consultants:  none  Procedures:  2D echo   Antibiotics  Anti-infectives   Start     Dose/Rate Route Frequency Ordered Stop   02/10/13 1700  levofloxacin (LEVAQUIN) IVPB 500 mg     500 mg 100 mL/hr over 60 Minutes Intravenous Every 24 hours 02/09/13 1658     02/09/13 1700  levofloxacin (LEVAQUIN) IVPB 500 mg     500 mg 100 mL/hr over 60 Minutes Intravenous  Once 02/09/13 1621 02/09/13 1740     Antibiotics Given (last 72 hours)   None     HPI/Subjective: Feels better this morning.   Objective: Filed Vitals:   02/09/13 2248 02/10/13 0100 02/10/13 0513 02/10/13 0909  BP: 128/86 120/73 137/79 130/78  Pulse: 82 76 71 72  Temp: 98.2 F (36.8 C) 97.9 F (36.6 C) 98.2 F (36.8 C) 97.7 F (36.5 C)  TempSrc: Oral Oral  Oral  Resp: 20 20 20 20   Height:      Weight:   79.9 kg (176 lb 2.4 oz)   SpO2: 96% 92% 94% 95%    Intake/Output Summary (Last 24 hours) at 02/10/13 1502 Last data filed at 02/10/13 0700  Gross per 24 hour  Intake 1632.5 ml  Output    600 ml  Net 1032.5 ml   Filed Weights   02/09/13 1701 02/10/13 0513  Weight: 79.878 kg (176 lb 1.6 oz) 79.9 kg (176 lb 2.4 oz)   Exam:  General:  NAD  Cardiovascular: regular rate and rhythm, without  MRG  Respiratory: scattered rhonchi present   Abdomen: soft, not tender to palpation, positive bowel sounds  MSK: no peripheral edema  Neuro: non focal   Data Reviewed: Basic Metabolic Panel:  Recent Labs Lab 02/05/13 0828 02/09/13 1400 02/09/13 1707 02/10/13 0350  NA 135 137  --  139  K 3.3* 4.1  --  4.2  CL 97 95*  --  99  CO2  --  31  --  28  GLUCOSE 114* 151*  --  140*  BUN 20 20  --  20  CREATININE 0.70 0.60  --  0.58  CALCIUM  --  9.1  --  8.7  MG  --   --  2.3  --    Liver Function Tests:  Recent Labs Lab 02/09/13 1707 02/10/13 0350  AST 30 32  ALT 19 22  ALKPHOS 56 57  BILITOT 0.6 0.3  PROT 7.8 7.2  ALBUMIN 3.5 3.0*   CBC:  Recent Labs Lab 02/05/13 0828 02/09/13 1400 02/09/13 1707 02/10/13 0350  WBC  --  9.9 9.0 9.8  HGB 16.0 14.9 14.9 13.8  HCT 47.0 42.8 42.5 39.0  MCV  --  97.5 99.5 99.2  PLT  --  162 156 176   Cardiac Enzymes:  Recent Labs Lab 02/09/13 1708 02/09/13 2315  02/10/13 0350  TROPONINI <0.30 <0.30 <0.30   BNP (last 3 results)  Recent Labs  02/09/13 1400  PROBNP 197.4*   Studies: Dg Chest 2 View  02/09/2013   CLINICAL DATA:  Cough, fever, shortness of Breath  EXAM: CHEST  2 VIEW  COMPARISON:  02/05/2013  FINDINGS: Cardiomediastinal silhouette is stable. No acute infiltrate or pulmonary edema. Mild degenerative changes thoracic spine again noted. Mild hyperinflation. Stable chronic mild interstitial prominence. Central mild bronchitic changes.  IMPRESSION: No acute infiltrate or pulmonary edema. Central mild bronchitic changes. Mild hyperinflation. Probable chronic mild interstitial prominence.   Electronically Signed   By: Lahoma Crocker M.D.   On: 02/09/2013 14:01   Ct Angio Chest Pe W/cm &/or Wo Cm  02/10/2013   CLINICAL DATA:  Short of breath, productive cough, elevated D-dimer  EXAM: CT ANGIOGRAPHY CHEST WITH CONTRAST  TECHNIQUE: Multidetector CT imaging of the chest was performed using the standard protocol during bolus  administration of intravenous contrast. Multiplanar CT image reconstructions including MIPs were obtained to evaluate the vascular anatomy.  CONTRAST:  151mL OMNIPAQUE IOHEXOL 350 MG/ML SOLN  COMPARISON:  Chest x-ray 02/09/2013  FINDINGS: Mediastinum: Unremarkable CT appearance of the thyroid gland. Nonspecific, not enlarged by CT criteria mediastinal and right hilar lymph nodes. No soft tissue mediastinal mass. The thoracic esophagus is unremarkable.  Heart/Vascular: Adequate opacification of the pulmonary arteries to the proximal subsegmental level. No central filling defect to suggest acute pulmonary embolus. The main and central pulmonary arteries are within normal limits for size. Four vessel aortic arch. The left vertebral artery arises the thoracic aorta. No aneurysmal dilatation. Normal cardiac size. No pericardial effusion.  Lungs/Pleura: Advanced predominantly paraseptal emphysema with bullous change in the right apex. No pneumothorax. Dependent atelectasis noted in the bilateral lower lobes. Diffuse mild bronchial wall thickening. Several small foci of ground-glass attenuation opacity are noted in a broncho. Vascular distribution in the inferior right upper lobe concerning for an acute infectious/ inflammatory process. In the superior segment of the right lower lobe there is a 1.3 cm cavitary nodule versus emphysema with surrounding ground-glass attenuation. Extending inferiorly there is a more solid component measuring 8 mm. Partially calcified tiny granuloma in the periphery of the inferior right middle lobe.  Bones/Soft Tissues: No acute fracture or aggressive appearing lytic or blastic osseous lesion. Thoracic epidural spinal stimulator noted.  Upper Abdomen: Unremarkable visualized upper abdomen.  Review of the MIP images confirms the above findings.  IMPRESSION: 1. Negative for acute pulmonary embolus. 2. Patchy ground-glass attenuation foci in a peribronchovascular distribution in the right upper  lobe most consistent with an acute infectious/ inflammatory process such as early bronchopneumonia. 3. Nonspecific 1.3 cm cavitary nodule versus emphysematous bleb with surrounding/adjacent ground-glass attenuation in the superior segment of the right lower lobe. Differential considerations include infection/ inflammation versus primary bronchogenic carcinoma. Recommend short interval followup CT scan following an appropriate course of therapy in 4- 6 weeks. If the finding persists, PET-CT may become warranted for further evaluation. 4. Advanced predominantly centrilobular pulmonary emphysema. 5. Diffuse mild bronchial wall thickening suggests chronic bronchitis. These results were called by telephone at the time of interpretation on 02/10/2013 at 10:11 AM to Dr. Marzetta Board , who verbally acknowledged these results.   Electronically Signed   By: Jacqulynn Cadet M.D.   On: 02/10/2013 10:06    Scheduled Meds: . enoxaparin (LOVENOX) injection  40 mg Subcutaneous Q24H  . folic acid  1 mg Oral Daily  . levofloxacin (LEVAQUIN) IV  500  mg Intravenous Q24H  . levothyroxine  75 mcg Oral QAC breakfast  . morphine  30 mg Oral BID  . predniSONE  50 mg Oral Q breakfast   Continuous Infusions:   Principal Problem:   CAP (community acquired pneumonia) Active Problems:   HYPOTHYROIDISM   ASTHMA   COPD   ARTHRITIS, RHEUMATOID   BACK PAIN, LUMBAR   COPD (chronic obstructive pulmonary disease)   Acute bronchitis  Time spent: [place], MD [facility] Pager [phone]. If 7 PM - 7 AM, please contact night-coverage at [email or web address], password [facility] 02/10/2013, 3:02 PM  LOS: 1 day

## 2013-02-10 NOTE — Progress Notes (Addendum)
Client is alert and oriented to surroundings.  Barrel chest to some degree.  To CT  for angiogram.   1550 Ambulating in hallway with spouse.

## 2013-02-11 DIAGNOSIS — J441 Chronic obstructive pulmonary disease with (acute) exacerbation: Secondary | ICD-10-CM | POA: Diagnosis not present

## 2013-02-11 LAB — URINALYSIS, ROUTINE W REFLEX MICROSCOPIC
Bilirubin Urine: NEGATIVE
Glucose, UA: NEGATIVE mg/dL
Hgb urine dipstick: NEGATIVE
KETONES UR: NEGATIVE mg/dL
LEUKOCYTES UA: NEGATIVE
Nitrite: NEGATIVE
PROTEIN: 30 mg/dL — AB
Specific Gravity, Urine: 1.029 (ref 1.005–1.030)
Urobilinogen, UA: 1 mg/dL (ref 0.0–1.0)
pH: 6.5 (ref 5.0–8.0)

## 2013-02-11 LAB — URINE MICROSCOPIC-ADD ON

## 2013-02-11 MED ORDER — LEVOFLOXACIN 500 MG PO TABS: 500.0000 mg | ORAL_TABLET | Freq: Every day | ORAL | Status: DC

## 2013-02-11 MED ORDER — PREDNISONE 20 MG PO TABS: 20.0000 mg | ORAL_TABLET | Freq: Every day | ORAL | Status: DC

## 2013-02-11 MED ORDER — ALBUTEROL SULFATE HFA 108 (90 BASE) MCG/ACT IN AERS: 2.0000 | INHALATION_SPRAY | Freq: Four times a day (QID) | RESPIRATORY_TRACT | Status: DC | PRN

## 2013-02-11 MED ORDER — GUAIFENESIN-DM 100-10 MG/5ML PO SYRP: 5.0000 mL | ORAL_SOLUTION | ORAL | Status: DC | PRN

## 2013-02-11 NOTE — Discharge Instructions (Signed)

## 2013-02-11 NOTE — Discharge Summary (Signed)
Physician Discharge Summary  Andrew DEPTULA [id number] DOB: 22-Apr-1950 DOA: 02/09/2013  PCP: Manon Hilding, MD  Admit date: 02/09/2013 Discharge date: 02/11/2013  Time spent: 35 minutes  Recommendations for Outpatient Follow-up:  1. Follow up with PCP in 1 week 2. Follow up with Dr. Lamonte Sakai in 3-4 weeks  Recommendations for primary care physician for things to follow:  Repeat Chest CT in 4-6 weeks per radiology recommendations  Discharge Diagnoses:  Principal Problem:   CAP (community acquired pneumonia) Active Problems:   HYPOTHYROIDISM   ASTHMA   COPD   ARTHRITIS, RHEUMATOID   BACK PAIN, LUMBAR   COPD (chronic obstructive pulmonary disease)   Acute bronchitis  Discharge Condition: stable  Diet recommendation: heart healthy  Filed Weights   02/09/13 1701 02/10/13 0513 02/11/13 0455  Weight: 79.878 kg (176 lb 1.6 oz) 79.9 kg (176 lb 2.4 oz) 81.421 kg (179 lb 8 oz)   History of present illness:  63 year old male with a history of rheumatoid arthritis presents with cough congestion, low-grade fever 100.8 at home, sore throat, headache, dizziness for the last one week. Patient reported a headache when he was seen in the ER on 12/29 at [facility]. He had a chest x-ray that showed interstitial fibrosis. CT scan of the head showed pansinus disease. Subsequently seen by Dr. Quintin Alto, diagnosed with acute sinusitis, prescribed inhaler (proair), and given cefprozil 500 mg Bid x 10 days. According to the wife and the daughter the patient is progressively becoming weak, can barely walk from the bedroom to the living room because of weakness and shortness of breath. The patient is being admitted because he is immunocompromised and is infliximab methotrexate for his rheumatoid arthritis. Today her chest x-ray does not show any acute infiltrate or pulmonary edema. No white count.  Hospital Course:  CAP - based on CT patient has RUL PNA. He was started on levofloxacin with significant  improvement in his symptoms. On the day of discharge, he was afebrile, was able to ambulate in the hallway few hundred feet with O2 sats > 94%, without chest pain or other problems. He is to complete a course of Levofloxacin as an outpatient and I recommended that he follows up with his PCP in 1 week for post hospital visit.  Cavitary nodule 1.3 cm RLL - per CT, will need close outpatient follow up 4-6 weeks.   RA - outpatient follow up.  - continue home pain regimen  Hypothyroidism - continue synthroid  Progressive shortness of breath - given that some of his dyspnea preceded his PNA symptoms and that infliximab increases risk of pulmonary embolism, a D dimer was checked and found to be elevated, however CTPA negative for PE. 2D echo showed normal systolic function (full read below).  COPD - nebs as needed. Patient was discharged on rescue inhaler (to which he is resistant to use) and a steroid taper.   Procedures:  2D echo Study Conclusions Left ventricle: The cavity size was normal. Systolic function was normal. The estimated ejection fraction was in the range of 55% to 60%. Wall motion was normal; there were no regional wall motion abnormalities. There was an increased relative contribution of atrial contraction to ventricular filling.  Consultations:  none  Discharge Exam: Filed Vitals:   02/10/13 2206 02/11/13 0100 02/11/13 0455 02/11/13 0900  BP: 133/80 136/59 152/75 135/71  Pulse: 77 74 70 65  Temp: 98.1 F (36.7 C) 98 F (36.7 C) 97.8 F (36.6 C) 97.9 F (36.6 C)  TempSrc:  Oral Oral Oral Oral  Resp: 20 20 20 20   Height:      Weight:   81.421 kg (179 lb 8 oz)   SpO2: 94% 96% 97% 94%   General: NAD Cardiovascular: RRR Respiratory: CTA biL  Discharge Instructions    Medication List    STOP taking these medications       cefPROZIL 500 MG tablet  Commonly known as:  CEFZIL      TAKE these medications       albuterol 108 (90 BASE) MCG/ACT inhaler  Commonly known  as:  PROVENTIL HFA;VENTOLIN HFA  Inhale 2 puffs into the lungs every 6 (six) hours as needed for wheezing or shortness of breath.     folic acid 1 MG tablet  Commonly known as:  FOLVITE  Take 1 mg by mouth daily.     guaiFENesin-dextromethorphan 100-10 MG/5ML syrup  Commonly known as:  ROBITUSSIN DM  Take 5 mLs by mouth every 4 (four) hours as needed for cough.     inFLIXimab 100 MG injection  Commonly known as:  REMICADE  Inject 100 mg into the vein every 6 (six) weeks.     levofloxacin 500 MG tablet  Commonly known as:  LEVAQUIN  Take 1 tablet (500 mg total) by mouth daily.     levothyroxine 75 MCG tablet  Commonly known as:  SYNTHROID, LEVOTHROID  Take 75 mcg by mouth daily before breakfast.     methotrexate 2.5 MG tablet  Commonly known as:  RHEUMATREX  - Take 25 mg by mouth once a week. Caution:Chemotherapy. Protect from light.  - 10 pills every Thursday     morphine 30 MG tablet  Commonly known as:  MSIR  Take 30 mg by mouth 2 (two) times daily.     oxyCODONE-acetaminophen 10-325 MG per tablet  Commonly known as:  PERCOCET  Take 1 tablet by mouth 3 (three) times daily as needed for pain. Pain     predniSONE 20 MG tablet  Commonly known as:  DELTASONE  Take 1 tablet (20 mg total) by mouth daily with breakfast. 2 tabs daily for 3 days then 1 tab daily for 3 days then 1/2 tab daily for 4 days.     zolpidem 5 MG tablet  Commonly known as:  AMBIEN  Take 5 mg by mouth at bedtime as needed. Sleep           Follow-up Information   Follow up with Manon Hilding, MD. Schedule an appointment as soon as possible for a visit in 1 week.   Specialty:  Cardiology   Contact information:   [place] [address] [phone]       Follow up with Collene Gobble., MD. Schedule an appointment as soon as possible for a visit in 1 week.   Specialty:  Pulmonary Disease   Contact information:   [address] [place] [address] [phone]       The  results of significant diagnostics from this hospitalization (including imaging, microbiology, ancillary and laboratory) are listed below for reference.    Significant Diagnostic Studies: Dg Chest 2 View  02/09/2013   CLINICAL DATA:  Cough, fever, shortness of Breath  EXAM: CHEST  2 VIEW  COMPARISON:  02/05/2013  FINDINGS: Cardiomediastinal silhouette is stable. No acute infiltrate or pulmonary edema. Mild degenerative changes thoracic spine again noted. Mild hyperinflation. Stable chronic mild interstitial prominence. Central mild bronchitic changes.  IMPRESSION: No acute infiltrate or pulmonary edema. Central mild bronchitic  changes. Mild hyperinflation. Probable chronic mild interstitial prominence.   Electronically Signed   By: Lahoma Crocker M.D.   On: 02/09/2013 14:01   Dg Chest 2 View  02/05/2013   CLINICAL DATA:  Cough.  EXAM: CHEST  2 VIEW  COMPARISON:  09/17/2011.  FINDINGS: Mediastinum and hilar structures are normal. Stable interstitial prominence noted suggesting interstitial fibrosis. No pneumothorax or pleural effusion. Heart size normal. Pulmonary vascularity normal. A neurostimulator noted in stable position.  IMPRESSION: 1. Changes consistent with interstitial fibrosis. No acute cardiopulmonary disease. 2. Neurostimulator noted in stable position projected over the thoracic spine.   Electronically Signed   By: Marcello Moores  Register   On: 02/05/2013 09:00   Ct Head Wo Contrast  02/05/2013   CLINICAL DATA:  Headache  EXAM: CT HEAD WITHOUT CONTRAST  TECHNIQUE: Contiguous axial images were obtained from the base of the skull through the vertex without intravenous contrast.  COMPARISON:  None.  FINDINGS: There is no evidence of acute hemorrhage nor mass effect. Diffuse areas of low attenuation project within the subcortical, deep and periventricular white matter regions. Focal confluent regions project along periphery of the left lateral ventricle anterior and posteriorly. These findings have the  appearance of chronic regions of the lacunar infarction. There is mild diffuse cortical atrophy. The ventricles and cisterns are patent. The osseous structures demonstrate no evidence of a depressed skull fracture. Mucosal thickening and air-fluid levels identified within the visualized portions of the maxillary sinuses. Postsurgical changes are identified within the maxillary sinuses. There is diffuse mucosal thickening and areas of opacifications within the ethmoid air cells. The mastoid air cells are patent. An air-fluid level is appreciated within the sphenoid air cell on the left.  IMPRESSION: Chronic and involutional intracranial findings without evidence of focal or acute abnormalities. There are findings also consistent with pan sinus disease.   Electronically Signed   By: Margaree Mackintosh M.D.   On: 02/05/2013 09:15   Ct Angio Chest Pe W/cm &/or Wo Cm  02/10/2013   CLINICAL DATA:  Short of breath, productive cough, elevated D-dimer  EXAM: CT ANGIOGRAPHY CHEST WITH CONTRAST  TECHNIQUE: Multidetector CT imaging of the chest was performed using the standard protocol during bolus administration of intravenous contrast. Multiplanar CT image reconstructions including MIPs were obtained to evaluate the vascular anatomy.  CONTRAST:  171mL OMNIPAQUE IOHEXOL 350 MG/ML SOLN  COMPARISON:  Chest x-ray 02/09/2013  FINDINGS: Mediastinum: Unremarkable CT appearance of the thyroid gland. Nonspecific, not enlarged by CT criteria mediastinal and right hilar lymph nodes. No soft tissue mediastinal mass. The thoracic esophagus is unremarkable.  Heart/Vascular: Adequate opacification of the pulmonary arteries to the proximal subsegmental level. No central filling defect to suggest acute pulmonary embolus. The main and central pulmonary arteries are within normal limits for size. Four vessel aortic arch. The left vertebral artery arises the thoracic aorta. No aneurysmal dilatation. Normal cardiac size. No pericardial effusion.   Lungs/Pleura: Advanced predominantly paraseptal emphysema with bullous change in the right apex. No pneumothorax. Dependent atelectasis noted in the bilateral lower lobes. Diffuse mild bronchial wall thickening. Several small foci of ground-glass attenuation opacity are noted in a broncho. Vascular distribution in the inferior right upper lobe concerning for an acute infectious/ inflammatory process. In the superior segment of the right lower lobe there is a 1.3 cm cavitary nodule versus emphysema with surrounding ground-glass attenuation. Extending inferiorly there is a more solid component measuring 8 mm. Partially calcified tiny granuloma in the periphery of the inferior right middle lobe.  Bones/Soft Tissues: No acute fracture or aggressive appearing lytic or blastic osseous lesion. Thoracic epidural spinal stimulator noted.  Upper Abdomen: Unremarkable visualized upper abdomen.  Review of the MIP images confirms the above findings.  IMPRESSION: 1. Negative for acute pulmonary embolus. 2. Patchy ground-glass attenuation foci in a peribronchovascular distribution in the right upper lobe most consistent with an acute infectious/ inflammatory process such as early bronchopneumonia. 3. Nonspecific 1.3 cm cavitary nodule versus emphysematous bleb with surrounding/adjacent ground-glass attenuation in the superior segment of the right lower lobe. Differential considerations include infection/ inflammation versus primary bronchogenic carcinoma. Recommend short interval followup CT scan following an appropriate course of therapy in 4- 6 weeks. If the finding persists, PET-CT may become warranted for further evaluation. 4. Advanced predominantly centrilobular pulmonary emphysema. 5. Diffuse mild bronchial wall thickening suggests chronic bronchitis. These results were called by telephone at the time of interpretation on 02/10/2013 at 10:11 AM to Dr. Marzetta Board , who verbally acknowledged these results.   Electronically  Signed   By: Jacqulynn Cadet M.D.   On: 02/10/2013 10:06   Labs: Basic Metabolic Panel:  Recent Labs Lab 02/05/13 0828 02/09/13 1400 02/09/13 1707 02/10/13 0350  NA 135 137  --  139  K 3.3* 4.1  --  4.2  CL 97 95*  --  99  CO2  --  31  --  28  GLUCOSE 114* 151*  --  140*  BUN 20 20  --  20  CREATININE 0.70 0.60  --  0.58  CALCIUM  --  9.1  --  8.7  MG  --   --  2.3  --    Liver Function Tests:  Recent Labs Lab 02/09/13 1707 02/10/13 0350  AST 30 32  ALT 19 22  ALKPHOS 56 57  BILITOT 0.6 0.3  PROT 7.8 7.2  ALBUMIN 3.5 3.0*   CBC:  Recent Labs Lab 02/05/13 0828 02/09/13 1400 02/09/13 1707 02/10/13 0350  WBC  --  9.9 9.0 9.8  HGB 16.0 14.9 14.9 13.8  HCT 47.0 42.8 42.5 39.0  MCV  --  97.5 99.5 99.2  PLT  --  162 156 176   Cardiac Enzymes:  Recent Labs Lab 02/09/13 1708 02/09/13 2315 02/10/13 0350  TROPONINI <0.30 <0.30 <0.30   BNP: BNP (last 3 results)  Recent Labs  02/09/13 1400  PROBNP 197.4*   Signed:  Marcena Dias  [facility] 02/11/2013, 4:45 PM

## 2013-02-11 NOTE — Progress Notes (Signed)
SATURATION QUALIFICATIONS: (This note is used to comply with regulatory documentation for home oxygen)  Patient Saturations on Room Air at Rest = 96  Patient Saturations on Room Air while Ambulating = 95%  Patient Saturations on 0 Liters of oxygen while Ambulating = 96%  Please briefly explain why patient needs home oxygen: Walked without oxygen and tolerated without incident.

## 2013-02-11 NOTE — Progress Notes (Signed)
Client discharged this am and verbalizes understanding of discharge instructions.  RX given to spouse.

## 2013-02-12 NOTE — Progress Notes (Signed)
Retro UR completed Gavriel Holzhauer K. Siham Bucaro, RN, BSN, [place], CCM  02/12/2013 12:01 PM

## 2013-02-28 ENCOUNTER — Ambulatory Visit (INDEPENDENT_AMBULATORY_CARE_PROVIDER_SITE_OTHER): Payer: BC Managed Care – PPO | Admitting: Emergency Medicine

## 2013-02-28 ENCOUNTER — Encounter: Payer: Self-pay | Admitting: Emergency Medicine

## 2013-02-28 VITALS — BP 130/86 | HR 94 | Ht 70.0 in | Wt 180.2 lb

## 2013-02-28 DIAGNOSIS — R911 Solitary pulmonary nodule: Secondary | ICD-10-CM

## 2013-02-28 DIAGNOSIS — R918 Other nonspecific abnormal finding of lung field: Secondary | ICD-10-CM | POA: Insufficient documentation

## 2013-02-28 DIAGNOSIS — J189 Pneumonia, unspecified organism: Secondary | ICD-10-CM | POA: Diagnosis not present

## 2013-02-28 DIAGNOSIS — J449 Chronic obstructive pulmonary disease, unspecified: Secondary | ICD-10-CM

## 2013-02-28 NOTE — Progress Notes (Signed)
History of Present Illness:  63 yo former smoker, hx of RA on chronic pred and MTX (started 2011). Childhood asthma that he grew out of. He has had trouble as an adult when he gets a URI. Has been freq treated with He has exertional dyspnea that is worse over about 2 months. Hears wheezing with exertion. As part eval had CXR, not available at this time. He tell me that the film was abnormal, not sure what it showed. Has seen Dr Alva Garnet before, has been on Advair, albuterol, maybe others.   ROV 01/09/10 -- dyspnea, hx tobacco, RA/MTX. Has been on Spiriva and Advair before per old chart in 05, 06. PFT today show moderate AFL, borderline BD response, no restriction. Normal DLCO. Has siblings w COPD.   ROV 09/17/11 -- 63 yo man, former tobacco, hx RA on MTX and humira. Last time 2011 we showed that he has moderate AFL on spiro. He tells me that he is having progressive SOB over many months time. We had planned to restart BD's last time. He isn't using anything right now. He didn't tolerate Spiriva or Advair - felt bad when he used them. He describes throat and esophageal pain that happens acutely and then resolves - ? Nutcracker esophagus.   ROV 10/18/11 -- 63 yo man, former tobacco, hx RA on MTX and humira.  Last visit we started Symbicort + SABA prn (had AFL on spiro 2011). No evidence ILD on CXR last time (for RA and MTX). He isn't sure that the Symbicort has helped him. He hasn't uses the albuterol   ROV 04/27/12 --  63 yo man, former tobacco and COPD (moderate AFL on spiro), hx RA on MTX and humira. Returns for f/u. He stopped symbicort after no benefit on therapeutic trial.  He tells me that his breathing has been stable. Still gets out of breath with heavy lifting, moving too quickly. He has SABA but never uses.   ROV 02/28/13 -- COPD and RA on MTX and remicade. He was admitted for a RUL CAP on 02/09/13 at [facility]. The dx was made by CT scan chest 1/3, with evidence for some bronchiectatic change and GGI, also  showed a RUL cavity vs bleb. Needs a repeat in 2 mon. He is feeling better but not back to full strength. He is having exertional dyspnea.  His only inhaled med right now is ProAir.   Filed Vitals:   02/28/13 0917  BP: 130/86  Pulse: 94  Height: 5\' 10"  (1.778 m)  Weight: 180 lb 3.2 oz (81.738 kg)  SpO2: 96%   Gen: Pleasant, well-nourished, in no distress,  normal affect  ENT: No lesions,  mouth clear,  oropharynx clear, no postnasal drip  Neck: No JVD, no TMG, no carotid bruits  Lungs: No use of accessory muscles, no wheeze on normal breath, mild exp wheeze on forced exp  Cardiovascular: RRR, heart sounds normal, no murmur or gallops, no peripheral edema  Musculoskeletal: No deformities, no cyanosis or clubbing  Neuro: alert, non focal  Skin: Warm, no lesions or rashes  COPD (chronic obstructive pulmonary disease) Has not required BD's in the past but may be getting to the point when he will need scheduled meds. We will revisit at next visit   CAP (community acquired pneumonia) Clinically resolved.   Pulmonary nodule, right DDx includes an opportunistic infxn in setting immunosuppression, resolving area of bacterial PNA (recently treated). He also is at risk for adenoCA given his hx tobacco

## 2013-02-28 NOTE — Assessment & Plan Note (Signed)
Clinically resolved.  

## 2013-02-28 NOTE — Assessment & Plan Note (Signed)
Has not required BD's in the past but may be getting to the point when he will need scheduled meds. We will revisit at next visit

## 2013-02-28 NOTE — Patient Instructions (Addendum)
We need to repeat your CT scan of the chest in 6 -8 weeks  We will follow up after the CT scan to review. We will also decide at that time whether to try a new inhaler.  Follow with Dr Lamonte Sakai after the CT scan to review.

## 2013-02-28 NOTE — Assessment & Plan Note (Signed)
DDx includes an opportunistic infxn in setting immunosuppression, resolving area of bacterial PNA (recently treated). He also is at risk for adenoCA given his hx tobacco

## 2013-03-07 ENCOUNTER — Encounter: Payer: Self-pay | Admitting: Cardiovascular Disease

## 2013-03-07 ENCOUNTER — Ambulatory Visit (INDEPENDENT_AMBULATORY_CARE_PROVIDER_SITE_OTHER): Payer: BC Managed Care – PPO | Admitting: Cardiovascular Disease

## 2013-03-07 ENCOUNTER — Other Ambulatory Visit: Payer: Self-pay | Admitting: Cardiology

## 2013-03-07 ENCOUNTER — Encounter: Payer: Self-pay | Admitting: *Deleted

## 2013-03-07 VITALS — BP 133/71 | HR 71 | Ht 70.0 in | Wt 180.7 lb

## 2013-03-07 DIAGNOSIS — R0602 Shortness of breath: Secondary | ICD-10-CM

## 2013-03-07 DIAGNOSIS — R079 Chest pain, unspecified: Secondary | ICD-10-CM | POA: Diagnosis not present

## 2013-03-07 NOTE — Patient Instructions (Signed)
Your physician has requested that you have a dobutamine myoview. For furth information please visit [email or web address]. Please follow instruction sheet, as given. Office will contact with results via phone or letter.   Continue all current medications. Follow up will be based on test results

## 2013-03-07 NOTE — Progress Notes (Signed)
Patient ID: Andrew [name], male   DOB: 07/03/1950, 63 y.o.   MRN: [id number]       CARDIOLOGY CONSULT NOTE  Patient ID: Andrew [name] MRN: [id number] DOB/AGE: 11-Jun-1950 63 y.o.  Admit date: (Not on file) Primary Physician Manon Hilding, MD  Reason for Consultation: arrhythmia  HPI: Patient is a 63 year old male who I am evaluating for the first time. He has a history of rheumatoid arthritis with consequent interstitial lung disease and COPD. He was recently hospitalized for a community-acquired pneumonia. An echocardiogram performed this month showed normal left ventricular systolic function, EF 0000000, with normal regional wall motion. An ECG on 02/09/2013 showed normal sinus rhythm with no diagnostic ST segment or T wave abnormalities. He denies palpitations. However he has been experiencing chest pain off and on over the past few years but more noticeably over the past several months. At times he said to pull over while driving until the chest pain subsides. However, he says "as quickly as it comes, it disappears". He denies syncope but does have lightheadedness. He quit smoking several years ago.  Fam: no h/o premature CAD.    Allergies  Allergen Reactions  . Penicillins Nausea Only    REACTION: nausea    Current Outpatient Prescriptions  Medication Sig Dispense Refill  . albuterol (PROVENTIL HFA;VENTOLIN HFA) 108 (90 BASE) MCG/ACT inhaler Inhale 2 puffs into the lungs every 6 (six) hours as needed for wheezing or shortness of breath.  1 Inhaler  2  . folic acid (FOLVITE) 1 MG tablet Take 1 mg by mouth daily.      Marland Kitchen guaiFENesin-dextromethorphan (ROBITUSSIN DM) 100-10 MG/5ML syrup Take 5 mLs by mouth every 4 (four) hours as needed for cough.  118 mL  0  . hydroxychloroquine (PLAQUENIL) 200 MG tablet Take 200 mg by mouth 2 (two) times daily.       Marland Kitchen inFLIXimab (REMICADE) 100 MG injection Inject 100 mg into the vein every 8 (eight) weeks.       Marland Kitchen levothyroxine (SYNTHROID,  LEVOTHROID) 75 MCG tablet Take 75 mcg by mouth daily before breakfast.      . methotrexate (RHEUMATREX) 2.5 MG tablet Take 25 mg by mouth once a week. Caution:Chemotherapy. Protect from light. 10 pills every Thursday      . morphine (MSIR) 30 MG tablet Take 30 mg by mouth 2 (two) times daily.       Marland Kitchen oxyCODONE-acetaminophen (PERCOCET) 10-325 MG per tablet Take 1 tablet by mouth 3 (three) times daily as needed for pain. Pain      . zolpidem (AMBIEN) 5 MG tablet Take 5 mg by mouth at bedtime as needed. Sleep       No current facility-administered medications for this visit.    Past Medical History  Diagnosis Date  . Insomnia   . Osteoarthritis   . Rheumatoid arteritis   . Inguinal hernia   . Aneurysm     POSTERIOR BRAIN - NO FOLLOW UP X 40 YRS  . PONV (postoperative nausea and vomiting)   . COPD (chronic obstructive pulmonary disease)   . Bruises easily   . H/O hiatal hernia   . Difficulty urinating   . Enlarged prostate   . Thyroid disease   . Asthma     "when I was real young"  . Emphysema   . Pneumonia 1970's; 2000's    "I've had it twice" (02/09/2013)  . Chronic bronchitis     "get it anytime I catch a cold" (02/09/2013)  .  Exertional shortness of breath     "just walking from room to room sometimes" (02/09/2013)  . Hypothyroidism   . Hepatitis     "I've been told I had hepatitis; had yellow jaundice when I wa young" (02/09/2013)  . Daily headache     "here lately" (02/09/2013)  . Chronic back pain   . Fibromyalgia     Past Surgical History  Procedure Laterality Date  . Total hip arthroplasty Left   . Replacement total knee Left   . Hammer toe surgery Bilateral   . Lumbar disc surgery    . Lumbar spine surgery  X2  . Shoulder arthroscopy w/ rotator cuff repair Left   . Carpal tunnel release Left   . Knee arthroscopy Bilateral   . Spinal cord stimulator implant  2010  . Esophagogastroduodenoscopy (egd) with esophageal dilation    . Nasal sinus surgery      X 3  .  Inguinal hernia repair  01/28/2012    Procedure: LAPAROSCOPIC BILATERAL INGUINAL HERNIA REPAIR;  Surgeon: Ralene Ok, MD;  Location: [facility];  Service: General;  Laterality: Bilateral;  . Insertion of mesh  01/28/2012    Procedure: INSERTION OF MESH;  Surgeon: Ralene Ok, MD;  Location: [facility];  Service: General;  Laterality: Bilateral;  . Hernia repair      History   Social History  . Marital Status: Married    Spouse Name: N/A    Number of Children: 4  . Years of Education: N/A   Occupational History  . Disabled    Social History Main Topics  . Smoking status: Former Smoker -- 3.00 packs/day for 40 years    Types: Cigarettes    Quit date: 02/08/1997  . Smokeless tobacco: Former Systems developer     Comment: 02/09/2013 "quit chewing many years ago"  . Alcohol Use: No  . Drug Use: No  . Sexual Activity: Not Currently   Other Topics Concern  . Not on file   Social History Narrative  . No narrative on file     Family History  Problem Relation Age of Onset  . Cancer Sister   . Multiple sclerosis Sister   . Esophageal cancer Brother   . COPD Sister   . COPD Brother   . Lung cancer Mother      Prior to Admission medications   Medication Sig Start Date End Date Taking? Authorizing Provider  albuterol (PROVENTIL HFA;VENTOLIN HFA) 108 (90 BASE) MCG/ACT inhaler Inhale 2 puffs into the lungs every 6 (six) hours as needed for wheezing or shortness of breath. 02/11/13  Yes Costin Karlyne Greenspan, MD  folic acid (FOLVITE) 1 MG tablet Take 1 mg by mouth daily.   Yes Historical Provider, MD  guaiFENesin-dextromethorphan (ROBITUSSIN DM) 100-10 MG/5ML syrup Take 5 mLs by mouth every 4 (four) hours as needed for cough. 02/11/13  Yes Costin Karlyne Greenspan, MD  hydroxychloroquine (PLAQUENIL) 200 MG tablet Take 200 mg by mouth 2 (two) times daily.  03/01/13  Yes Historical Provider, MD  inFLIXimab (REMICADE) 100 MG injection Inject 100 mg into the vein every 8 (eight) weeks.    Yes Historical Provider, MD    levothyroxine (SYNTHROID, LEVOTHROID) 75 MCG tablet Take 75 mcg by mouth daily before breakfast.   Yes Historical Provider, MD  methotrexate (RHEUMATREX) 2.5 MG tablet Take 25 mg by mouth once a week. Caution:Chemotherapy. Protect from light. 10 pills every Thursday   Yes Historical Provider, MD  morphine (MSIR) 30 MG tablet Take 30 mg by  mouth 2 (two) times daily.    Yes Historical Provider, MD  oxyCODONE-acetaminophen (PERCOCET) 10-325 MG per tablet Take 1 tablet by mouth 3 (three) times daily as needed for pain. Pain 11/11/11  Yes Historical Provider, MD  zolpidem (AMBIEN) 5 MG tablet Take 5 mg by mouth at bedtime as needed. Sleep   Yes Historical Provider, MD     Review of systems complete and found to be negative unless listed above in HPI     Physical exam Blood pressure 133/71, pulse 71, height 5\' 10"  (1.778 m), weight 180 lb 11.2 oz (81.965 kg), SpO2 99.00%. General: NAD Neck: No JVD, no thyromegaly or thyroid nodule.  Lungs: Faint expiratory wheezes/intermittent, otherwise clear to auscultation bilaterally with normal respiratory effort. CV: Nondisplaced PMI.  Heart regular S1/S2, no S3/S4, no murmur.  No peripheral edema.  No carotid bruit.  Normal pedal pulses.  Abdomen: Soft, nontender, no hepatosplenomegaly, no distention.  Skin: Intact without lesions or rashes.  Neurologic: Alert and oriented x 3.  Psych: Normal affect. Extremities: No clubbing or cyanosis.  HEENT: Normal.   Labs:   Lab Results  Component Value Date   WBC 9.8 02/10/2013   HGB 13.8 02/10/2013   HCT 39.0 02/10/2013   MCV 99.2 02/10/2013   PLT 176 02/10/2013   No results found for this basename: NA, K, CL, CO2, BUN, CREATININE, CALCIUM, LABALBU, PROT, BILITOT, ALKPHOS, ALT, AST, GLUCOSE,  in the last 168 hours Lab Results  Component Value Date   TROPONINI <0.30 02/10/2013    No results found for this basename: CHOL   No results found for this basename: HDL   No results found for this basename: LDLCALC    No results found for this basename: TRIG   No results found for this basename: CHOLHDL   No results found for this basename: LDLDIRECT          ASSESSMENT AND PLAN:  1. Chest pain: It appears his symptoms have been more frequent and more severe in intensity over the past several months. He tells me he had a normal stress tests at least 3-4 years ago. He has a prior history of tobacco use. In order to evaluate for occult ischemic heart disease as a potential etiology of his symptoms, I will proceed with a dobutamine Cardiolite stress test. Followup will be determined based on the results of cardiac testing. He does have normal left ventricular systolic function and regional wall motion by recent echocardiography.  Signed: Kate Sable, M.D., F.A.C.C.  03/07/2013, 9:39 AM

## 2013-03-15 ENCOUNTER — Encounter (HOSPITAL_COMMUNITY)
Admission: RE | Admit: 2013-03-15 | Discharge: 2013-03-15 | Disposition: A | Discharge status: Home / Self Care | Payer: BC Managed Care – PPO | Source: Ambulatory Visit | Attending: Cardiovascular Disease | Admitting: Cardiovascular Disease

## 2013-03-15 ENCOUNTER — Encounter (HOSPITAL_COMMUNITY): Payer: Self-pay

## 2013-03-15 DIAGNOSIS — R0602 Shortness of breath: Secondary | ICD-10-CM

## 2013-03-15 DIAGNOSIS — J189 Pneumonia, unspecified organism: Secondary | ICD-10-CM | POA: Diagnosis not present

## 2013-03-15 DIAGNOSIS — R079 Chest pain, unspecified: Secondary | ICD-10-CM

## 2013-03-15 DIAGNOSIS — J449 Chronic obstructive pulmonary disease, unspecified: Secondary | ICD-10-CM | POA: Insufficient documentation

## 2013-03-15 DIAGNOSIS — J4489 Other specified chronic obstructive pulmonary disease: Secondary | ICD-10-CM | POA: Insufficient documentation

## 2013-03-15 DIAGNOSIS — M069 Rheumatoid arthritis, unspecified: Secondary | ICD-10-CM | POA: Diagnosis not present

## 2013-03-15 HISTORY — DX: Systemic involvement of connective tissue, unspecified: M35.9

## 2013-03-15 MED ORDER — TECHNETIUM TC 99M SESTAMIBI - CARDIOLITE
10.0000 | Freq: Once | INTRAVENOUS | Status: AC | PRN
  Administered 2013-03-15: 10 via INTRAVENOUS

## 2013-03-15 MED ORDER — DOBUTAMINE IN D5W 4-5 MG/ML-% IV SOLN
INTRAVENOUS | Status: AC
  Administered 2013-03-15: 10 ug/kg/min via INTRAVENOUS
  Filled 2013-03-15: qty 250

## 2013-03-15 MED ORDER — TECHNETIUM TC 99M SESTAMIBI - CARDIOLITE
30.0000 | Freq: Once | INTRAVENOUS | Status: AC | PRN
  Administered 2013-03-15: 30 via INTRAVENOUS

## 2013-03-15 MED ORDER — SODIUM CHLORIDE 0.9 % IJ SOLN
INTRAMUSCULAR | Status: AC
  Administered 2013-03-15: 10 mL via INTRAVENOUS
  Filled 2013-03-15: qty 10

## 2013-03-15 NOTE — Progress Notes (Signed)
Stress Lab Nurses Notes - [place] [facility] 03/15/2013 Reason for doing test: Chest Pain and Dyspnea Type of test: Dobutamine Cardiolite Nurse performing test: Gerrit Halls, RN Nuclear Medicine Tech: Melburn Hake Echo Tech: Not Applicable MD performing test: Koneswaran/K.Lawrence NP Family MD: Sasser Test explained and consent signed: yes IV started: 22g jelco, Saline lock flushed, No redness or edema and Saline lock started in radiology Symptoms: Nausea Treatment/Intervention: None Reason test stopped: reached target HR After recovery IV was: Discontinued via X-ray tech and No redness or edema Patient to return to Nuc. Med at : 12:30 Patient discharged: Home Patient's Condition upon discharge was: stable Comments: During test peak BP 145/76 & HR 148.  Recovery BP 118/70 & HR 92.  Symptoms resolved in recovery. Geanie Cooley T

## 2013-03-26 ENCOUNTER — Ambulatory Visit (INDEPENDENT_AMBULATORY_CARE_PROVIDER_SITE_OTHER)
Admission: RE | Admit: 2013-03-26 | Discharge: 2013-03-26 | Disposition: A | Discharge status: Home / Self Care | Payer: BC Managed Care – PPO | Source: Ambulatory Visit | Attending: Emergency Medicine | Admitting: Emergency Medicine

## 2013-03-26 DIAGNOSIS — R911 Solitary pulmonary nodule: Secondary | ICD-10-CM

## 2013-03-29 ENCOUNTER — Ambulatory Visit (INDEPENDENT_AMBULATORY_CARE_PROVIDER_SITE_OTHER): Payer: BC Managed Care – PPO | Admitting: Emergency Medicine

## 2013-03-29 ENCOUNTER — Encounter: Payer: Self-pay | Admitting: Emergency Medicine

## 2013-03-29 VITALS — BP 110/72 | HR 75 | Ht 70.0 in | Wt 186.0 lb

## 2013-03-29 DIAGNOSIS — J449 Chronic obstructive pulmonary disease, unspecified: Secondary | ICD-10-CM | POA: Diagnosis not present

## 2013-03-29 DIAGNOSIS — J189 Pneumonia, unspecified organism: Secondary | ICD-10-CM | POA: Diagnosis not present

## 2013-03-29 NOTE — Assessment & Plan Note (Signed)
He has improved but still has exertional SOB. He is on no BD's, didn't think they helped him. I would like to trial Brovana to see if the delivery system is an issue. Will f/u in 1 month to assess. Walking oximetry today

## 2013-03-29 NOTE — Assessment & Plan Note (Signed)
Resolved by CT scan 2/16.

## 2013-03-29 NOTE — Patient Instructions (Signed)
We will start Brovana twice a day by nebulizer to see if this helps you.  Walking oximetry today Follow with Dr Lamonte Sakai in 1 month

## 2013-03-29 NOTE — Progress Notes (Signed)
History of Present Illness:  63 yo former smoker, hx of RA on chronic pred and MTX (started 2011). Childhood asthma that he grew out of. He has had trouble as an adult when he gets a URI. Has been freq treated with He has exertional dyspnea that is worse over about 2 months. Hears wheezing with exertion. As part eval had CXR, not available at this time. He tell me that the film was abnormal, not sure what it showed. Has seen Dr Alva Garnet before, has been on Advair, albuterol, maybe others.   ROV 01/09/10 -- dyspnea, hx tobacco, RA/MTX. Has been on Spiriva and Advair before per old chart in 05, 06. PFT today show moderate AFL, borderline BD response, no restriction. Normal DLCO. Has siblings w COPD.   ROV 09/17/11 -- 63 yo man, former tobacco, hx RA on MTX and humira. Last time 2011 we showed that he has moderate AFL on spiro. He tells me that he is having progressive SOB over many months time. We had planned to restart BD's last time. He isn't using anything right now. He didn't tolerate Spiriva or Advair - felt bad when he used them. He describes throat and esophageal pain that happens acutely and then resolves - ? Nutcracker esophagus.   ROV 10/18/11 -- 63 yo man, former tobacco, hx RA on MTX and humira.  Last visit we started Symbicort + SABA prn (had AFL on spiro 2011). No evidence ILD on CXR last time (for RA and MTX). He isn't sure that the Symbicort has helped him. He hasn't uses the albuterol   ROV 04/27/12 --  63 yo man, former tobacco and COPD (moderate AFL on spiro), hx RA on MTX and humira. Returns for f/u. He stopped symbicort after no benefit on therapeutic trial.  He tells me that his breathing has been stable. Still gets out of breath with heavy lifting, moving too quickly. He has SABA but never uses.   ROV 02/28/13 -- COPD and RA on MTX and remicade. He was admitted for a RUL CAP on 02/09/13 at [facility]. The dx was made by CT scan chest 1/3, with evidence for some bronchiectatic change and GGI, also  showed a RUL cavity vs bleb. Needs a repeat in 2 mon. He is feeling better but not back to full strength. He is having exertional dyspnea.  His only inhaled med right now is ProAir.   ROV 03/29/13 -- hx COPD, RA on MTX, plaquanil and remicade. CT chest 02/10/13 with RUL cavity/bleb, bronchiectasis, GGI. He returns today to review repeat CT done 03/26/13 > full resolution, 2 stable nodules < 38mm. He still doesn't feel full speed yet. His RA and joints are flaring. He uses ProAir rarely. He has not really benefited from Spiriva, Symbicort, Advair in the past.   Filed Vitals:   03/29/13 1329  BP: 110/72  Pulse: 75  Height: 5\' 10"  (1.778 m)  Weight: 186 lb (84.369 kg)  SpO2: 98%   Gen: Pleasant, well-nourished, in no distress,  normal affect  ENT: No lesions,  mouth clear,  oropharynx clear, no postnasal drip  Neck: No JVD, no TMG, no carotid bruits  Lungs: No use of accessory muscles, no wheeze on normal breath, mild exp wheeze on forced exp  Cardiovascular: RRR, heart sounds normal, no murmur or gallops, no peripheral edema  Musculoskeletal: No deformities, no cyanosis or clubbing  Neuro: alert, non focal  Skin: Warm, no lesions or rashes   COPD He has improved but still has exertional  SOB. He is on no BD's, didn't think they helped him. I would like to trial Brovana to see if the delivery system is an issue. Will f/u in 1 month to assess. Walking oximetry today  CAP (community acquired pneumonia) Resolved by CT scan 2/16.

## 2013-05-01 ENCOUNTER — Ambulatory Visit (INDEPENDENT_AMBULATORY_CARE_PROVIDER_SITE_OTHER): Payer: BC Managed Care – PPO | Admitting: Emergency Medicine

## 2013-05-01 ENCOUNTER — Encounter: Payer: Self-pay | Admitting: Emergency Medicine

## 2013-05-01 VITALS — BP 160/100 | HR 73 | Ht 70.0 in | Wt 186.0 lb

## 2013-05-01 DIAGNOSIS — J449 Chronic obstructive pulmonary disease, unspecified: Secondary | ICD-10-CM

## 2013-05-01 DIAGNOSIS — R911 Solitary pulmonary nodule: Secondary | ICD-10-CM

## 2013-05-01 NOTE — Progress Notes (Signed)
History of Present Illness:  63 yo former smoker, hx of RA on chronic pred and MTX (started 2011). Childhood asthma that he grew out of. He has had trouble as an adult when he gets a URI. Has been freq treated with He has exertional dyspnea that is worse over about 2 months. Hears wheezing with exertion. As part eval had CXR, not available at this time. He tell me that the film was abnormal, not sure what it showed. Has seen Dr Alva Garnet before, has been on Advair, albuterol, maybe others.   ROV 01/09/10 -- dyspnea, hx tobacco, RA/MTX. Has been on Spiriva and Advair before per old chart in 05, 06. PFT today show moderate AFL, borderline BD response, no restriction. Normal DLCO. Has siblings w COPD.   ROV 09/17/11 -- 23 yo man, former tobacco, hx RA on MTX and humira. Last time 2011 we showed that he has moderate AFL on spiro. He tells me that he is having progressive SOB over many months time. We had planned to restart BD's last time. He isn't using anything right now. He didn't tolerate Spiriva or Advair - felt bad when he used them. He describes throat and esophageal pain that happens acutely and then resolves - ? Nutcracker esophagus.   ROV 10/18/11 -- 63 yo man, former tobacco, hx RA on MTX and humira.  Last visit we started Symbicort + SABA prn (had AFL on spiro 2011). No evidence ILD on CXR last time (for RA and MTX). He isn't sure that the Symbicort has helped him. He hasn't uses the albuterol   ROV 04/27/12 --  63 yo man, former tobacco and COPD (moderate AFL on spiro), hx RA on MTX and humira. Returns for f/u. He stopped symbicort after no benefit on therapeutic trial.  He tells me that his breathing has been stable. Still gets out of breath with heavy lifting, moving too quickly. He has SABA but never uses.   ROV 02/28/13 -- COPD and RA on MTX and remicade. He was admitted for a RUL CAP on 02/09/13 at [facility]. The dx was made by CT scan chest 1/3, with evidence for some bronchiectatic change and GGI, also  showed a RUL cavity vs bleb. Needs a repeat in 2 mon. He is feeling better but not back to full strength. He is having exertional dyspnea.  His only inhaled med right now is ProAir.   ROV 03/29/13 -- hx COPD, RA on MTX, plaquanil and remicade. CT chest 02/10/13 with RUL cavity/bleb, bronchiectasis, GGI. He returns today to review repeat CT done 03/26/13 > full resolution, 2 stable nodules < 53mm. He still doesn't feel full speed yet. His RA and joints are flaring. He uses ProAir rarely. He has not really benefited from Spiriva, Symbicort, Advair in the past.   ROV 05/01/13 -- hx COPD, RA on MTX, plaquanil and remicade. Following pulm nodules by CT scans last in 03/2013. He is just getting over a GI bug > feeling some better. We did a trial of brovana to see if he would benefit > no improvement in his breathing. No other changes.   Filed Vitals:   05/01/13 1148  BP: 160/100  Pulse: 73  Height: 5\' 10"  (1.778 m)  Weight: 186 lb (84.369 kg)  SpO2: 99%   Gen: Pleasant, well-nourished, in no distress,  normal affect  ENT: No lesions,  mouth clear,  oropharynx clear, no postnasal drip  Neck: No JVD, no TMG, no carotid bruits  Lungs: No use of accessory  muscles, no wheeze on normal breath, mild exp wheeze on forced exp  Cardiovascular: RRR, heart sounds normal, no murmur or gallops, no peripheral edema  Musculoskeletal: No deformities, no cyanosis or clubbing  Neuro: alert, non focal  Skin: Warm, no lesions or rashes   COPD (chronic obstructive pulmonary disease) Didn't benefit from brovana. None of the BD's we have tried have every made a difference for him.  - stop the brovana - rov 6 months   Pulmonary nodule, right - repeat CT scan in 03/2013

## 2013-05-01 NOTE — Assessment & Plan Note (Signed)
-   repeat CT scan in 03/2013

## 2013-05-01 NOTE — Patient Instructions (Signed)
Please stop the brovana since it was not helping you  Use albuterol if needed for shortness of breath You need a repeat CT scan of the chest in February 2016.

## 2013-05-01 NOTE — Assessment & Plan Note (Addendum)
Didn't benefit from brovana. None of the BD's we have tried have every made a difference for him.  - stop the brovana - rov 6 months

## 2013-08-14 ENCOUNTER — Emergency Department (HOSPITAL_COMMUNITY)
Admission: EM | Admit: 2013-08-14 | Discharge: 2013-08-14 | Disposition: A | Discharge status: Home / Self Care | Payer: BC Managed Care – PPO | Attending: Emergency Medicine | Admitting: Emergency Medicine

## 2013-08-14 ENCOUNTER — Encounter (HOSPITAL_COMMUNITY): Payer: Self-pay | Admitting: Emergency Medicine

## 2013-08-14 DIAGNOSIS — J3489 Other specified disorders of nose and nasal sinuses: Secondary | ICD-10-CM | POA: Insufficient documentation

## 2013-08-14 DIAGNOSIS — M1990 Unspecified osteoarthritis, unspecified site: Secondary | ICD-10-CM | POA: Insufficient documentation

## 2013-08-14 DIAGNOSIS — R112 Nausea with vomiting, unspecified: Secondary | ICD-10-CM | POA: Insufficient documentation

## 2013-08-14 DIAGNOSIS — Z87448 Personal history of other diseases of urinary system: Secondary | ICD-10-CM | POA: Insufficient documentation

## 2013-08-14 DIAGNOSIS — R51 Headache: Secondary | ICD-10-CM | POA: Insufficient documentation

## 2013-08-14 DIAGNOSIS — Z8719 Personal history of other diseases of the digestive system: Secondary | ICD-10-CM | POA: Insufficient documentation

## 2013-08-14 DIAGNOSIS — G4700 Insomnia, unspecified: Secondary | ICD-10-CM | POA: Insufficient documentation

## 2013-08-14 DIAGNOSIS — R519 Headache, unspecified: Secondary | ICD-10-CM

## 2013-08-14 DIAGNOSIS — Z87891 Personal history of nicotine dependence: Secondary | ICD-10-CM | POA: Insufficient documentation

## 2013-08-14 DIAGNOSIS — G8929 Other chronic pain: Secondary | ICD-10-CM | POA: Insufficient documentation

## 2013-08-14 DIAGNOSIS — J449 Chronic obstructive pulmonary disease, unspecified: Secondary | ICD-10-CM | POA: Insufficient documentation

## 2013-08-14 DIAGNOSIS — Z880 Allergy status to penicillin: Secondary | ICD-10-CM | POA: Insufficient documentation

## 2013-08-14 DIAGNOSIS — Z79899 Other long term (current) drug therapy: Secondary | ICD-10-CM | POA: Insufficient documentation

## 2013-08-14 DIAGNOSIS — E039 Hypothyroidism, unspecified: Secondary | ICD-10-CM | POA: Insufficient documentation

## 2013-08-14 DIAGNOSIS — H53149 Visual discomfort, unspecified: Secondary | ICD-10-CM | POA: Insufficient documentation

## 2013-08-14 DIAGNOSIS — Z8701 Personal history of pneumonia (recurrent): Secondary | ICD-10-CM | POA: Insufficient documentation

## 2013-08-14 DIAGNOSIS — J4489 Other specified chronic obstructive pulmonary disease: Secondary | ICD-10-CM | POA: Insufficient documentation

## 2013-08-14 LAB — CBC WITH DIFFERENTIAL/PLATELET
Basophils Absolute: 0 K/uL (ref 0.0–0.1)
Basophils Relative: 0 % (ref 0–1)
Eosinophils Absolute: 0 K/uL (ref 0.0–0.7)
Eosinophils Relative: 0 % (ref 0–5)
HCT: 41.5 % (ref 39.0–52.0)
Hemoglobin: 14.7 g/dL (ref 13.0–17.0)
Lymphocytes Relative: 12 % (ref 12–46)
Lymphs Abs: 1.6 K/uL (ref 0.7–4.0)
MCH: 33.9 pg (ref 26.0–34.0)
MCHC: 35.4 g/dL (ref 30.0–36.0)
MCV: 95.8 fL (ref 78.0–100.0)
Monocytes Absolute: 1.1 K/uL — ABNORMAL HIGH (ref 0.1–1.0)
Monocytes Relative: 8 % (ref 3–12)
Neutro Abs: 10 K/uL — ABNORMAL HIGH (ref 1.7–7.7)
Neutrophils Relative %: 80 % — ABNORMAL HIGH (ref 43–77)
Platelets: 211 K/uL (ref 150–400)
RBC: 4.33 MIL/uL (ref 4.22–5.81)
RDW: 13.1 % (ref 11.5–15.5)
WBC: 12.6 K/uL — ABNORMAL HIGH (ref 4.0–10.5)

## 2013-08-14 LAB — BASIC METABOLIC PANEL WITH GFR
Anion gap: 14 (ref 5–15)
BUN: 16 mg/dL (ref 6–23)
CO2: 26 meq/L (ref 19–32)
Calcium: 9.4 mg/dL (ref 8.4–10.5)
Chloride: 98 meq/L (ref 96–112)
Creatinine, Ser: 0.63 mg/dL (ref 0.50–1.35)
GFR calc Af Amer: >90 mL/min
GFR calc non Af Amer: >90 mL/min
Glucose, Bld: 111 mg/dL — ABNORMAL HIGH (ref 70–99)
Potassium: 3.7 meq/L (ref 3.7–5.3)
Sodium: 138 meq/L (ref 137–147)

## 2013-08-14 MED ORDER — KETOROLAC TROMETHAMINE 15 MG/ML IJ SOLN
15.0000 mg | Freq: Once | INTRAMUSCULAR | Status: AC
  Administered 2013-08-14: 15 mg via INTRAVENOUS

## 2013-08-14 MED ORDER — ONDANSETRON HCL 4 MG PO TABS: 4.0000 mg | ORAL_TABLET | Freq: Four times a day (QID) | ORAL | Status: DC

## 2013-08-14 MED ORDER — AMOXICILLIN-POT CLAVULANATE 875-125 MG PO TABS: 1.0000 | ORAL_TABLET | Freq: Two times a day (BID) | ORAL | Status: DC

## 2013-08-14 MED ORDER — KETOROLAC TROMETHAMINE 30 MG/ML IJ SOLN
15.0000 mg | Freq: Once | INTRAMUSCULAR | Status: DC
  Filled 2013-08-14 (×2): qty 1

## 2013-08-14 MED ORDER — SODIUM CHLORIDE 0.9 % IV BOLUS (SEPSIS)
500.0000 mL | Freq: Once | INTRAVENOUS | Status: AC
  Administered 2013-08-14: 500 mL via INTRAVENOUS

## 2013-08-14 MED ORDER — PROCHLORPERAZINE EDISYLATE 5 MG/ML IJ SOLN
10.0000 mg | Freq: Once | INTRAMUSCULAR | Status: AC
  Administered 2013-08-14: 10 mg via INTRAVENOUS
  Filled 2013-08-14: qty 2

## 2013-08-14 MED ORDER — DEXAMETHASONE SODIUM PHOSPHATE 10 MG/ML IJ SOLN
10.0000 mg | Freq: Once | INTRAMUSCULAR | Status: AC
  Administered 2013-08-14: 10 mg via INTRAVENOUS
  Filled 2013-08-14: qty 1

## 2013-08-14 MED ORDER — ONDANSETRON HCL 4 MG/2ML IJ SOLN
4.0000 mg | Freq: Once | INTRAMUSCULAR | Status: AC
  Administered 2013-08-14: 4 mg via INTRAVENOUS
  Filled 2013-08-14: qty 2

## 2013-08-14 MED ORDER — AMOXICILLIN-POT CLAVULANATE 875-125 MG PO TABS
1.0000 | ORAL_TABLET | Freq: Once | ORAL | Status: AC
  Administered 2013-08-14: 1 via ORAL
  Filled 2013-08-14: qty 1

## 2013-08-14 NOTE — ED Notes (Signed)
H/a started Sunday with nausea and vomiting--but has had a sinus infection for approx 1 month with green mucus/drainage. Has had 3 sinus surgeries in the past, with MRSA in sinus cavity after the last surgery in 2012.

## 2013-08-14 NOTE — Discharge Instructions (Signed)
Keep your appointment with Dr. Janace Hoard today. Call for a follow up appointment with a Family or Primary Care Provider.  Return if Symptoms worsen.   Take medication as prescribed.  Drink plenty of fluids.

## 2013-08-14 NOTE — ED Provider Notes (Signed)
CSN: [id number]     Arrival date & time 08/14/13  7062 History   First MD Initiated Contact with Patient 08/14/13 0957     Chief Complaint  Patient presents with  . Headache     (Consider location/radiation/quality/duration/timing/severity/associated sxs/prior Treatment) HPI Comments: The patient is a 63-year-old male with past medical history of aneurysm, presents emergency room chief complaint of gradual onset of headache for approximately 3 days.  He reports frontal pressure with sinus drainage, similar to previous headache.  He reports associated photophobia. The patient's wife reports fever of 100.4 3 days ago.  He reports taking advil and percocet without resolution of symptoms. Last antipyretic yesterday. He reports nausea with nonbloody emesis yesterday and 2 days ago.  The patient reports ENT appointment at 1600 today, was unable to schedule an walk-in appointment and was told to come in emergency department.  The history is provided by the patient and the spouse. No language interpreter was used.    Past Medical History  Diagnosis Date  . Insomnia   . Osteoarthritis   . Rheumatoid arteritis   . Inguinal hernia   . Aneurysm     POSTERIOR BRAIN - NO FOLLOW UP X 40 YRS  . PONV (postoperative nausea and vomiting)   . COPD (chronic obstructive pulmonary disease)   . Bruises easily   . H/O hiatal hernia   . Difficulty urinating   . Enlarged prostate   . Thyroid disease   . Asthma     "when I was real young"  . Emphysema   . Pneumonia 1970's; 2000's    "I've had it twice" (02/09/2013)  . Chronic bronchitis     "get it anytime I catch a cold" (02/09/2013)  . Exertional shortness of breath     "just walking from room to room sometimes" (02/09/2013)  . Hypothyroidism   . Hepatitis     "I've been told I had hepatitis; had yellow jaundice when I wa young" (02/09/2013)  . Daily headache     "here lately" (02/09/2013)  . Chronic back pain   . Fibromyalgia   . Collagen vascular disease     Past Surgical History  Procedure Laterality Date  . Total hip arthroplasty Left   . Replacement total knee Left   . Hammer toe surgery Bilateral   . Lumbar disc surgery    . Lumbar spine surgery  X2  . Shoulder arthroscopy w/ rotator cuff repair Left   . Carpal tunnel release Left   . Knee arthroscopy Bilateral   . Spinal cord stimulator implant  2010  . Esophagogastroduodenoscopy (egd) with esophageal dilation    . Nasal sinus surgery      X 3  . Inguinal hernia repair  01/28/2012    Procedure: LAPAROSCOPIC BILATERAL INGUINAL HERNIA REPAIR;  Surgeon: Ralene Ok, MD;  Location: [facility];  Service: General;  Laterality: Bilateral;  . Insertion of mesh  01/28/2012    Procedure: INSERTION OF MESH;  Surgeon: Ralene Ok, MD;  Location: [facility];  Service: General;  Laterality: Bilateral;  . Hernia repair     Family History  Problem Relation Age of Onset  . Cancer Sister   . Multiple sclerosis Sister   . Esophageal cancer Brother   . COPD Sister   . COPD Brother   . Lung cancer Mother    History  Substance Use Topics  . Smoking status: Former Smoker -- 3.00 packs/day for 40 years    Types: Cigarettes    Quit date:  02/08/1997  . Smokeless tobacco: Former Systems developer     Comment: 02/09/2013 "quit chewing many years ago"  . Alcohol Use: No    Review of Systems  HENT: Positive for rhinorrhea and sinus pressure.   Eyes: Positive for photophobia. Negative for visual disturbance.  Gastrointestinal: Positive for nausea and vomiting. Negative for abdominal pain.  Musculoskeletal: Negative for neck pain and neck stiffness.  Skin: Negative for color change.  Neurological: Positive for headaches. Negative for dizziness, syncope, facial asymmetry, speech difficulty, weakness, light-headedness and numbness.      Allergies  Penicillins  Home Medications   Prior to Admission medications   Medication Sig Start Date End Date Taking? Authorizing Provider  albuterol (PROVENTIL  HFA;VENTOLIN HFA) 108 (90 BASE) MCG/ACT inhaler Inhale 2 puffs into the lungs every 6 (six) hours as needed for wheezing or shortness of breath. 02/11/13   Costin Karlyne Greenspan, MD  folic acid (FOLVITE) 1 MG tablet Take 1 mg by mouth daily.    Historical Provider, MD  hydroxychloroquine (PLAQUENIL) 200 MG tablet Take 200 mg by mouth 2 (two) times daily.  03/01/13   Historical Provider, MD  inFLIXimab (REMICADE) 100 MG injection Inject 100 mg into the vein every 8 (eight) weeks.     Historical Provider, MD  levothyroxine (SYNTHROID, LEVOTHROID) 75 MCG tablet Take 75 mcg by mouth daily before breakfast.    Historical Provider, MD  methotrexate (RHEUMATREX) 2.5 MG tablet Take 25 mg by mouth once a week. Caution:Chemotherapy. Protect from light. 10 pills every Thursday    Historical Provider, MD  morphine (MSIR) 30 MG tablet Take 30 mg by mouth 2 (two) times daily.     Historical Provider, MD  oxyCODONE-acetaminophen (PERCOCET) 10-325 MG per tablet Take 1 tablet by mouth 3 (three) times daily as needed for pain. Pain 11/11/11   Historical Provider, MD  zolpidem (AMBIEN) 5 MG tablet Take 10 mg by mouth at bedtime as needed. Sleep    Historical Provider, MD   BP 159/87  Pulse 87  Temp(Src) 98.6 F (37 C) (Oral)  Resp 16  Ht 5\' 10"  (1.778 m)  Wt 186 lb (84.369 kg)  BMI 26.69 kg/m2  SpO2 96% Physical Exam  Nursing note and vitals reviewed. Constitutional: He is oriented to person, place, and time. He appears well-developed and well-nourished.  Non-toxic appearance. He does not have a sickly appearance. He does not appear ill. No distress.  HENT:  Head: Normocephalic and atraumatic.  Right Ear: Tympanic membrane and external ear normal. Tympanic membrane is not bulging.  Left Ear: Tympanic membrane and external ear normal. Tympanic membrane is not bulging.  Nose: Mucosal edema and rhinorrhea present. Right sinus exhibits no maxillary sinus tenderness and no frontal sinus tenderness. Left sinus exhibits no  maxillary sinus tenderness and no frontal sinus tenderness.  Mouth/Throat: Oropharynx is clear and moist. No oropharyngeal exudate.  Eyes: EOM are normal. Pupils are equal, round, and reactive to light. Right eye exhibits no discharge. Left eye exhibits no discharge. No scleral icterus.  Neck: Normal range of motion. Neck supple.  Cardiovascular: Normal rate and regular rhythm.   No murmur heard. No lower extremity edema  Pulmonary/Chest: Effort normal and breath sounds normal. No respiratory distress. He has no decreased breath sounds. He has no wheezes. He has no rales. He exhibits no tenderness.  Prolonged expiratory phase.  Abdominal: Soft. Bowel sounds are normal. He exhibits no distension. There is no tenderness. There is no rebound and no guarding.  Musculoskeletal: Normal range of  motion. He exhibits no edema.  Neurological: He is alert and oriented to person, place, and time.  Speech is clear and goal oriented, follows commands Cranial nerves III - XII grossly intact, no facial droop Normal strength in upper and lower extremities bilaterally, strong and equal grip strength Sensation normal to light touch Moves all 4 extremities without ataxia, coordination intact Normal finger to nose and rapid alternating movements No pronator drift  Skin: Skin is warm and dry. No rash noted. He is not diaphoretic.  Psychiatric: He has a normal mood and affect. His behavior is normal. Thought content normal.    ED Course  Procedures (including critical care time) Labs Review Labs Reviewed - No data to display  Imaging Review No results found.   EKG Interpretation None      MDM   Final diagnoses:  Frontal headache   The pt presents with frontal headache, extensive ENT history. No signs of meningitis, or neurologic deficits on exam, headache was not abrupt onset, worse headache of life. Will treat with a headache cocktail and antibiotics. Reevaluation patient resting comfortably in  room reports symptoms 5/10. Medications ordered. Reevaluation patient resting comfortably in room reports symptoms currently 4/10. Discussed keeping ENT appointment today. Discussed lab results, and treatment plan with the patient. Return precautions given. Reports understanding and no other concerns at this time.  Patient is stable for discharge at this time.  Meds given in ED:  Medications  sodium chloride 0.9 % bolus 500 mL (0 mLs Intravenous Stopped 08/14/13 1055)  dexamethasone (DECADRON) injection 10 mg (10 mg Intravenous Given 08/14/13 1040)  ondansetron (ZOFRAN) injection 4 mg (4 mg Intravenous Given 08/14/13 1040)  ketorolac (TORADOL) 15 MG/ML injection 15 mg (15 mg Intravenous Given 08/14/13 1052)  amoxicillin-clavulanate (AUGMENTIN) 875-125 MG per tablet 1 tablet (1 tablet Oral Given 08/14/13 1135)  prochlorperazine (COMPAZINE) injection 10 mg (10 mg Intravenous Given 08/14/13 1239)    New Prescriptions   AMOXICILLIN-CLAVULANATE (AUGMENTIN) 875-125 MG PER TABLET    Take 1 tablet by mouth 2 (two) times daily.   ONDANSETRON (ZOFRAN) 4 MG TABLET    Take 1 tablet (4 mg total) by mouth every 6 (six) hours.       Lorrine Kin, PA-C 08/15/13 [phone]

## 2013-08-15 NOTE — ED Provider Notes (Signed)
Medical screening examination/treatment/procedure(s) were performed by non-physician practitioner and as supervising physician I was immediately available for consultation/collaboration.   EKG Interpretation None        Hoy Morn, MD 08/15/13 1003

## 2013-08-23 DIAGNOSIS — R319 Hematuria, unspecified: Secondary | ICD-10-CM | POA: Diagnosis not present

## 2013-10-29 ENCOUNTER — Ambulatory Visit (INDEPENDENT_AMBULATORY_CARE_PROVIDER_SITE_OTHER): Payer: BC Managed Care – PPO | Admitting: Emergency Medicine

## 2013-10-29 ENCOUNTER — Encounter (INDEPENDENT_AMBULATORY_CARE_PROVIDER_SITE_OTHER): Payer: Self-pay

## 2013-10-29 ENCOUNTER — Encounter: Payer: Self-pay | Admitting: Emergency Medicine

## 2013-10-29 VITALS — BP 114/72 | HR 67 | Temp 98.9°F | Ht 70.0 in | Wt 180.6 lb

## 2013-10-29 DIAGNOSIS — Z23 Encounter for immunization: Secondary | ICD-10-CM

## 2013-10-29 DIAGNOSIS — R911 Solitary pulmonary nodule: Secondary | ICD-10-CM

## 2013-10-29 DIAGNOSIS — J449 Chronic obstructive pulmonary disease, unspecified: Secondary | ICD-10-CM

## 2013-10-29 DIAGNOSIS — J4489 Other specified chronic obstructive pulmonary disease: Secondary | ICD-10-CM

## 2013-10-29 NOTE — Patient Instructions (Signed)
Please continue to have your Albuterol available to use if needed for shortness of breath.  Flu shot today We will repeat your Ct scan of the chest in February 2016.  Follow with Andrew [name] in February after the scan, or sooner if you have any problems.

## 2013-10-29 NOTE — Assessment & Plan Note (Signed)
Clinically stable, did not appear to have an AE recent sinusitis.  Has not benefited from BD's - will continue to use just SABA  Prn.  Flu shot today.  rov Feb 2016

## 2013-10-29 NOTE — Progress Notes (Signed)
History of Present Illness:  63 yo former smoker, hx of RA on chronic pred and MTX (started 2011). Childhood asthma that he grew out of. He has had trouble as an adult when he gets a URI. Has been freq treated with He has exertional dyspnea that is worse over about 2 months. Hears wheezing with exertion. As part eval had CXR, not available at this time. He tell me that the film was abnormal, not sure what it showed. Has seen Dr Alva Garnet before, has been on Advair, albuterol, maybe others.   ROV 01/09/10 -- dyspnea, hx tobacco, RA/MTX. Has been on Spiriva and Advair before per old chart in 05, 06. PFT today show moderate AFL, borderline BD response, no restriction. Normal DLCO. Has siblings w COPD.   ROV 09/17/11 -- 69 yo man, former tobacco, hx RA on MTX and humira. Last time 2011 we showed that he has moderate AFL on spiro. He tells me that he is having progressive SOB over many months time. We had planned to restart BD's last time. He isn't using anything right now. He didn't tolerate Spiriva or Advair - felt bad when he used them. He describes throat and esophageal pain that happens acutely and then resolves - ? Nutcracker esophagus.   ROV 10/18/11 -- 63 yo man, former tobacco, hx RA on MTX and humira.  Last visit we started Symbicort + SABA prn (had AFL on spiro 2011). No evidence ILD on CXR last time (for RA and MTX). He isn't sure that the Symbicort has helped him. He hasn't uses the albuterol   ROV 04/27/12 --  63 yo man, former tobacco and COPD (moderate AFL on spiro), hx RA on MTX and humira. Returns for f/u. He stopped symbicort after no benefit on therapeutic trial.  He tells me that his breathing has been stable. Still gets out of breath with heavy lifting, moving too quickly. He has SABA but never uses.   ROV 02/28/13 -- COPD and RA on MTX and remicade. He was admitted for a RUL CAP on 02/09/13 at [facility]. The dx was made by CT scan chest 1/3, with evidence for some bronchiectatic change and GGI, also  showed a RUL cavity vs bleb. Needs a repeat in 2 mon. He is feeling better but not back to full strength. He is having exertional dyspnea.  His only inhaled med right now is ProAir.   ROV 03/29/13 -- hx COPD, RA on MTX, plaquanil and remicade. CT chest 02/10/13 with RUL cavity/bleb, bronchiectasis, GGI. He returns today to review repeat CT done 03/26/13 > full resolution, 2 stable nodules < 33mm. He still doesn't feel full speed yet. His RA and joints are flaring. He uses ProAir rarely. He has not really benefited from Spiriva, Symbicort, Advair in the past.   ROV 05/01/13 -- hx COPD, RA on MTX, plaquanil and remicade. Following pulm nodules by CT scans last in 03/2013. He is just getting over a GI bug > feeling some better. We did a trial of brovana to see if he would benefit > no improvement in his breathing. No other changes.   ROV 10/29/13 -- hx COPD, RA on MTX, plaquanil and remicade. Following pulm nodules, next scan due 03/2014.  He had some increased SOB through the summer, but has overall been stable. He was seen at [facility] for HA and dx with a sinusitis. He is improved now. He has not needed SABA.    Filed Vitals:   10/29/13 1402  BP: 114/72  Pulse: 67  Temp: 98.9 F (37.2 C)  TempSrc: Oral  Height: 5\' 10"  (1.778 m)  Weight: 180 lb 9.6 oz (81.92 kg)  SpO2: 92%   Gen: Pleasant, well-nourished, in no distress,  normal affect  ENT: No lesions,  mouth clear,  oropharynx clear, no postnasal drip  Neck: No JVD, no TMG, no carotid bruits  Lungs: No use of accessory muscles, no wheeze on normal breath, mild exp wheeze on forced exp  Cardiovascular: RRR, heart sounds normal, no murmur or gallops, no peripheral edema  Musculoskeletal: No deformities, no cyanosis or clubbing  Neuro: alert, non focal  Skin: Warm, no lesions or rashes   COPD Clinically stable, did not appear to have an AE recent sinusitis.  Has not benefited from BD's - will continue to use just SABA  Prn.  Flu shot today.   rov Feb 2016  Pulmonary nodule, right In setting RA, hx MTX.  - plan repeat Ct scan in 03/2014, then f/u to review

## 2013-10-29 NOTE — Assessment & Plan Note (Signed)
In setting RA, hx MTX.  - plan repeat Ct scan in 03/2014, then f/u to review

## 2014-01-22 DIAGNOSIS — H2512 Age-related nuclear cataract, left eye: Secondary | ICD-10-CM | POA: Diagnosis not present

## 2014-01-22 DIAGNOSIS — H25012 Cortical age-related cataract, left eye: Secondary | ICD-10-CM | POA: Diagnosis not present

## 2014-02-06 ENCOUNTER — Telehealth: Payer: Self-pay | Admitting: Emergency Medicine

## 2014-02-06 NOTE — Telephone Encounter (Signed)
No message nedding

## 2014-02-21 DIAGNOSIS — M150 Primary generalized (osteo)arthritis: Secondary | ICD-10-CM | POA: Diagnosis not present

## 2014-02-21 DIAGNOSIS — M0589 Other rheumatoid arthritis with rheumatoid factor of multiple sites: Secondary | ICD-10-CM | POA: Diagnosis not present

## 2014-02-21 DIAGNOSIS — G894 Chronic pain syndrome: Secondary | ICD-10-CM | POA: Diagnosis not present

## 2014-02-21 DIAGNOSIS — Z79899 Other long term (current) drug therapy: Secondary | ICD-10-CM | POA: Diagnosis not present

## 2014-03-04 DIAGNOSIS — Z79891 Long term (current) use of opiate analgesic: Secondary | ICD-10-CM | POA: Diagnosis not present

## 2014-03-04 DIAGNOSIS — G894 Chronic pain syndrome: Secondary | ICD-10-CM | POA: Diagnosis not present

## 2014-03-04 DIAGNOSIS — M5442 Lumbago with sciatica, left side: Secondary | ICD-10-CM | POA: Diagnosis not present

## 2014-03-20 DIAGNOSIS — J0101 Acute recurrent maxillary sinusitis: Secondary | ICD-10-CM | POA: Diagnosis not present

## 2014-03-26 ENCOUNTER — Ambulatory Visit (INDEPENDENT_AMBULATORY_CARE_PROVIDER_SITE_OTHER)
Admission: RE | Admit: 2014-03-26 | Discharge: 2014-03-26 | Disposition: A | Discharge status: Home / Self Care | Payer: BLUE CROSS/BLUE SHIELD | Source: Ambulatory Visit | Attending: Emergency Medicine | Admitting: Emergency Medicine

## 2014-03-26 DIAGNOSIS — R918 Other nonspecific abnormal finding of lung field: Secondary | ICD-10-CM | POA: Diagnosis not present

## 2014-03-26 DIAGNOSIS — R911 Solitary pulmonary nodule: Secondary | ICD-10-CM | POA: Diagnosis not present

## 2014-03-26 DIAGNOSIS — J439 Emphysema, unspecified: Secondary | ICD-10-CM | POA: Diagnosis not present

## 2014-03-28 ENCOUNTER — Encounter: Payer: Self-pay | Admitting: Emergency Medicine

## 2014-03-28 ENCOUNTER — Ambulatory Visit (INDEPENDENT_AMBULATORY_CARE_PROVIDER_SITE_OTHER): Payer: BLUE CROSS/BLUE SHIELD | Admitting: Emergency Medicine

## 2014-03-28 ENCOUNTER — Encounter (INDEPENDENT_AMBULATORY_CARE_PROVIDER_SITE_OTHER): Payer: Self-pay

## 2014-03-28 VITALS — BP 138/76 | HR 67 | Ht 70.0 in | Wt 188.0 lb

## 2014-03-28 DIAGNOSIS — R911 Solitary pulmonary nodule: Secondary | ICD-10-CM

## 2014-03-28 DIAGNOSIS — J449 Chronic obstructive pulmonary disease, unspecified: Secondary | ICD-10-CM | POA: Diagnosis not present

## 2014-03-28 NOTE — Assessment & Plan Note (Signed)
He is being clinically stable. He has not wanted scheduled medications in fact he has felt worse when we have tried these.Marland Kitchen He continues to use albuterol sparingly. We agreed today that we will hold off on any new medications at this time

## 2014-03-28 NOTE — Assessment & Plan Note (Signed)
His pulmonary nodules are stable over 2 years by CT scan of the chest. We did discuss the possible indication for low-dose CT scan screening for lung cancer. He understands the concept and the risks and benefits of cancer screening. We will revisit this at her 6 month appointment and if he agrees then we will arrange for screening in 1 year

## 2014-03-28 NOTE — Patient Instructions (Signed)
Your CT scan shows that your pulmonary nodules are stable.  Please continue to use your albuterol as needed.  We will discuss possible low dose CT scan screening of your lungs at your next visit.  Follow with Dr Lamonte Sakai in 6 months or sooner if you have any problems

## 2014-03-28 NOTE — Progress Notes (Signed)
History of Present Illness:  64 yo former smoker, hx of RA on chronic pred and MTX (started 2011). Childhood asthma that he grew out of. He has had trouble as an adult when he gets a URI. Has been freq treated with He has exertional dyspnea that is worse over about 2 months. Hears wheezing with exertion. As part eval had CXR, not available at this time. He tell me that the film was abnormal, not sure what it showed. Has seen Dr Alva Garnet before, has been on Advair, albuterol, maybe others.   ROV 01/09/10 -- dyspnea, hx tobacco, RA/MTX. Has been on Spiriva and Advair before per old chart in 05, 06. PFT today show moderate AFL, borderline BD response, no restriction. Normal DLCO. Has siblings w COPD.   ROV 09/17/11 -- 16 yo man, former tobacco, hx RA on MTX and humira. Last time 2011 we showed that he has moderate AFL on spiro. He tells me that he is having progressive SOB over many months time. We had planned to restart BD's last time. He isn't using anything right now. He didn't tolerate Spiriva or Advair - felt bad when he used them. He describes throat and esophageal pain that happens acutely and then resolves - ? Nutcracker esophagus.   ROV 10/18/11 -- 64 yo man, former tobacco, hx RA on MTX and humira.  Last visit we started Symbicort + SABA prn (had AFL on spiro 2011). No evidence ILD on CXR last time (for RA and MTX). He isn't sure that the Symbicort has helped him. He hasn't uses the albuterol   ROV 04/27/12 --  64 yo man, former tobacco and COPD (moderate AFL on spiro), hx RA on MTX and humira. Returns for f/u. He stopped symbicort after no benefit on therapeutic trial.  He tells me that his breathing has been stable. Still gets out of breath with heavy lifting, moving too quickly. He has SABA but never uses.   ROV 02/28/13 -- COPD and RA on MTX and remicade. He was admitted for a RUL CAP on 02/09/13 at [facility]. The dx was made by CT scan chest 1/3, with evidence for some bronchiectatic change and GGI, also  showed a RUL cavity vs bleb. Needs a repeat in 2 mon. He is feeling better but not back to full strength. He is having exertional dyspnea.  His only inhaled med right now is ProAir.   ROV 03/29/13 -- hx COPD, RA on MTX, plaquanil and remicade. CT chest 02/10/13 with RUL cavity/bleb, bronchiectasis, GGI. He returns today to review repeat CT done 03/26/13 > full resolution, 2 stable nodules < 18mm. He still doesn't feel full speed yet. His RA and joints are flaring. He uses ProAir rarely. He has not really benefited from Spiriva, Symbicort, Advair in the past.   ROV 05/01/13 -- hx COPD, RA on MTX, plaquanil and remicade. Following pulm nodules by CT scans last in 03/2013. He is just getting over a GI bug > feeling some better. We did a trial of brovana to see if he would benefit > no improvement in his breathing. No other changes.   ROV 10/29/13 -- hx COPD, RA on MTX, plaquanil and remicade. Following pulm nodules, next scan due 03/2014.  He had some increased SOB through the summer, but has overall been stable. He was seen at [facility] for HA and dx with a sinusitis. He is improved now. He has not needed SABA.   ROV 03/28/14 -- follow-up visit for COPD and rheumatoid arthritis on  methotrexate, Plaquenil and Remicade. We have been following pulmonary nodules by CT scan, documented stable now over 2 years. He has 120 pk-yrs tobacco and would qualify for LDCT screening. He is having a lot of joint pain - he isn't sure that remicade has helped him. He is talking to Dr Trudie Reed about making a change. He rarely uses albuterol, feels that his exercise tolerance is good.    Filed Vitals:   03/28/14 1350  BP: 138/76  Pulse: 67  Height: 5\' 10"  (1.778 m)  Weight: 188 lb (85.276 kg)  SpO2: 96%   Gen: Pleasant, well-nourished, in no distress,  normal affect  ENT: No lesions,  mouth clear,  oropharynx clear, no postnasal drip  Neck: No JVD, no TMG, no carotid bruits  Lungs: No use of accessory muscles, no wheeze on normal  breath, mild exp wheeze on forced exp  Cardiovascular: RRR, heart sounds normal, no murmur or gallops, no peripheral edema  Musculoskeletal: No deformities, no cyanosis or clubbing  Neuro: alert, non focal  Skin: Warm, no lesions or rashes   COPD (chronic obstructive pulmonary disease) He is being clinically stable. He has not wanted scheduled medications in fact he has felt worse when we have tried these.Marland Kitchen He continues to use albuterol sparingly. We agreed today that we will hold off on any new medications at this time   Pulmonary nodule, right His pulmonary nodules are stable over 2 years by CT scan of the chest. We did discuss the possible indication for low-dose CT scan screening for lung cancer. He understands the concept and the risks and benefits of cancer screening. We will revisit this at her 6 month appointment and if he agrees then we will arrange for screening in 1 year

## 2014-05-27 DIAGNOSIS — M5442 Lumbago with sciatica, left side: Secondary | ICD-10-CM | POA: Diagnosis not present

## 2014-05-27 DIAGNOSIS — G894 Chronic pain syndrome: Secondary | ICD-10-CM | POA: Diagnosis not present

## 2014-05-27 DIAGNOSIS — Z79891 Long term (current) use of opiate analgesic: Secondary | ICD-10-CM | POA: Diagnosis not present

## 2014-06-01 DIAGNOSIS — Z6826 Body mass index (BMI) 26.0-26.9, adult: Secondary | ICD-10-CM | POA: Diagnosis not present

## 2014-06-01 DIAGNOSIS — J209 Acute bronchitis, unspecified: Secondary | ICD-10-CM | POA: Diagnosis not present

## 2014-06-01 DIAGNOSIS — J0101 Acute recurrent maxillary sinusitis: Secondary | ICD-10-CM | POA: Diagnosis not present

## 2014-06-06 IMAGING — NM NM MYOCAR SINGLE W/SPECT W/WALL MOTION & EF
1 series · 6 of 6 positions shown · non-contrast
Comparison: None.

CLINICAL DATA: 62-year-old male with history of interstitial lung
disease and COPD, rheumatoid arthritis, recurrent pneumonia, and
chest pain symptoms. This study is requested to evaluate for the
presence of ischemia.

EXAM:
MYOCARDIAL IMAGING WITH SPECT (REST AND PHARMACOLOGIC-STRESS)
GATED LEFT VENTRICULAR WALL MOTION STUDY
LEFT VENTRICULAR EJECTION FRACTION
TECHNIQUE: Standard myocardial SPECT imaging was performed after resting
intravenous injection of 10 mCi Ic-HHm sestamibi. Subsequently,
intravenous infusion of Dobutamine was performed under the
supervision of the Cardiology staff. At peak effect of the drug, 30
mCi Ic-HHm sestamibi was injected intravenously and standard
myocardial SPECT imaging was performed. Quantitative gated imaging
was also performed to evaluate left ventricular wall motion, and
estimate left ventricular ejection fraction.

[Series 1: cs cardiac tc hi dose · 6.41mm/px · 6 of 512 frames shown]
[frame 43/512]
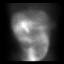
[frame 128/512]
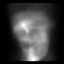
[frame 214/512]
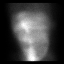
[frame 299/512]
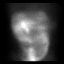
[frame 384/512]
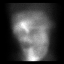
[frame 470/512]
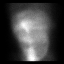

[6 of 6 positions shown; findings below may reference images not displayed]

FINDINGS: Baseline tracing shows normal sinus rhythm. Dobutamine was infused
beginning at 10 micrograms/kilogram/min and increased to a maximum
of 30 micrograms/kilogram/min to accelerate the heart rate to
target. Peak heart rate was 148 beats per min which was 93% of the
maximal age predicted heart rate response. Peak blood pressure was
145/76. No diagnostic ST segment abnormalities were noted, and there
were no significant arrhythmias. No chest pain was reported.

Analysis of the overall perfusion data finds diaphragmatic
attenuation with adequate radiotracer uptake in the myocardium.

Tomographic views were obtained using the short axis, vertical long
axis, and horizontal long axis planes. There is a small, moderate
intensity, inferior/iinferoseptal defect that is fixed. No
significant reversible perfusion defects are noted to indicate
ischemia.

Gated imaging reveals an EDV of 82, ESV of 29, TID ratio 0.91, and
LVEF of 64% without wall motion abnormalities.
IMPRESSION: Overall low risk Dobutamine Cardiolite. No diagnostic ST segment
changes were noted, and there were no arrhythmias. Perfusion imaging
is most consistent with soft tissue/diaphragmatic attenuation with
fixed inferior defect. No definite ischemic defects are noted. LVEF
64% with normal LV volumes and no wall motion abnormalities.

## 2014-06-12 DIAGNOSIS — G894 Chronic pain syndrome: Secondary | ICD-10-CM | POA: Diagnosis not present

## 2014-06-12 DIAGNOSIS — M150 Primary generalized (osteo)arthritis: Secondary | ICD-10-CM | POA: Diagnosis not present

## 2014-06-12 DIAGNOSIS — Z79899 Other long term (current) drug therapy: Secondary | ICD-10-CM | POA: Diagnosis not present

## 2014-06-12 DIAGNOSIS — M0589 Other rheumatoid arthritis with rheumatoid factor of multiple sites: Secondary | ICD-10-CM | POA: Diagnosis not present

## 2014-08-26 DIAGNOSIS — G894 Chronic pain syndrome: Secondary | ICD-10-CM | POA: Diagnosis not present

## 2014-08-26 DIAGNOSIS — M5442 Lumbago with sciatica, left side: Secondary | ICD-10-CM | POA: Diagnosis not present

## 2014-08-26 DIAGNOSIS — Z79891 Long term (current) use of opiate analgesic: Secondary | ICD-10-CM | POA: Diagnosis not present

## 2014-09-24 ENCOUNTER — Encounter: Payer: Self-pay | Admitting: Emergency Medicine

## 2014-09-24 ENCOUNTER — Ambulatory Visit (INDEPENDENT_AMBULATORY_CARE_PROVIDER_SITE_OTHER): Payer: BLUE CROSS/BLUE SHIELD | Admitting: Emergency Medicine

## 2014-09-24 VITALS — BP 116/86 | HR 62 | Ht 70.0 in | Wt 182.0 lb

## 2014-09-24 DIAGNOSIS — Z812 Family history of tobacco abuse and dependence: Secondary | ICD-10-CM | POA: Diagnosis not present

## 2014-09-24 DIAGNOSIS — J449 Chronic obstructive pulmonary disease, unspecified: Secondary | ICD-10-CM | POA: Diagnosis not present

## 2014-09-24 DIAGNOSIS — Z87891 Personal history of nicotine dependence: Secondary | ICD-10-CM

## 2014-09-24 DIAGNOSIS — R911 Solitary pulmonary nodule: Secondary | ICD-10-CM

## 2014-09-24 DIAGNOSIS — F17200 Nicotine dependence, unspecified, uncomplicated: Secondary | ICD-10-CM | POA: Insufficient documentation

## 2014-09-24 NOTE — Assessment & Plan Note (Signed)
Discussed lung cancer screening with him today. He is interested in having low-dose CT scan screening. I think there is an additional benefit here in that he does have a history of rheumatoid and methotrexate use this will allow me to follow his parenchyma as well.

## 2014-09-24 NOTE — Progress Notes (Signed)
History of Present Illness:  64 yo former smoker, hx of RA on chronic pred and MTX (started 2011). Childhood asthma that he grew out of. He has had trouble as an adult when he gets a URI. Has been freq treated with He has exertional dyspnea that is worse over about 2 months. Hears wheezing with exertion. As part eval had CXR, not available at this time. He tell me that the film was abnormal, not sure what it showed. Has seen Dr Alva Garnet before, has been on Advair, albuterol, maybe others.   ROV 01/09/10 -- dyspnea, hx tobacco, RA/MTX. Has been on Spiriva and Advair before per old chart in 05, 06. PFT today show moderate AFL, borderline BD response, no restriction. Normal DLCO. Has siblings w COPD.   ROV 09/17/11 -- 15 yo man, former tobacco, hx RA on MTX and humira. Last time 2011 we showed that he has moderate AFL on spiro. He tells me that he is having progressive SOB over many months time. We had planned to restart BD's last time. He isn't using anything right now. He didn't tolerate Spiriva or Advair - felt bad when he used them. He describes throat and esophageal pain that happens acutely and then resolves - ? Nutcracker esophagus.   ROV 10/18/11 -- 64 yo man, former tobacco, hx RA on MTX and humira.  Last visit we started Symbicort + SABA prn (had AFL on spiro 2011). No evidence ILD on CXR last time (for RA and MTX). He isn't sure that the Symbicort has helped him. He hasn't uses the albuterol   ROV 04/27/12 --  64 yo man, former tobacco and COPD (moderate AFL on spiro), hx RA on MTX and humira. Returns for f/u. He stopped symbicort after no benefit on therapeutic trial.  He tells me that his breathing has been stable. Still gets out of breath with heavy lifting, moving too quickly. He has SABA but never uses.   ROV 02/28/13 -- COPD and RA on MTX and remicade. He was admitted for a RUL CAP on 02/09/13 at [facility]. The dx was made by CT scan chest 1/3, with evidence for some bronchiectatic change and GGI, also  showed a RUL cavity vs bleb. Needs a repeat in 2 mon. He is feeling better but not back to full strength. He is having exertional dyspnea.  His only inhaled med right now is ProAir.   ROV 03/29/13 -- hx COPD, RA on MTX, plaquanil and remicade. CT chest 02/10/13 with RUL cavity/bleb, bronchiectasis, GGI. He returns today to review repeat CT done 03/26/13 > full resolution, 2 stable nodules < 4mm. He still doesn't feel full speed yet. His RA and joints are flaring. He uses ProAir rarely. He has not really benefited from Spiriva, Symbicort, Advair in the past.   ROV 05/01/13 -- hx COPD, RA on MTX, plaquanil and remicade. Following pulm nodules by CT scans last in 03/2013. He is just getting over a GI bug > feeling some better. We did a trial of brovana to see if he would benefit > no improvement in his breathing. No other changes.   ROV 10/29/13 -- hx COPD, RA on MTX, plaquanil and remicade. Following pulm nodules, next scan due 03/2014.  He had some increased SOB through the summer, but has overall been stable. He was seen at [facility] for HA and dx with a sinusitis. He is improved now. He has not needed SABA.   ROV 03/28/14 -- follow-up visit for COPD and rheumatoid arthritis on  methotrexate, Plaquenil and Remicade. We have been following pulmonary nodules by CT scan, documented stable now over 2 years. He has 120 pk-yrs tobacco and would qualify for LDCT screening. He is having a lot of joint pain - he isn't sure that remicade has helped him. He is talking to Dr Trudie Reed about making a change. He rarely uses albuterol, feels that his exercise tolerance is good.   ROV 09/24/14 -- Follow-up visit for COPD and a significant tobacco history, rheumatoid arthritis on methotrexate, Plaquenil. Was changed off remicade to another biologic, but currently he is not receiving because Dr Trudie Reed has changed location. We have followed pulmonary nodules on his CT scan of the chest that were documented stable 2 years on his last scan  03/2014.  He is still having poly-joint pain on the current meds. Not currently on scheduled BD. He has had exertional SOB, fatigue. Rarely uses albuterol. He has been on long-acting meds before but they haven't seemed to help him.    Filed Vitals:   09/24/14 1332 09/24/14 1333  BP:  116/86  Pulse:  62  Height: 5\' 10"  (1.778 m)   Weight: 182 lb (82.555 kg)   SpO2:  96%   Gen: Pleasant, well-nourished, in no distress,  normal affect  ENT: No lesions,  mouth clear,  oropharynx clear, no postnasal drip  Neck: No JVD, no TMG, no carotid bruits  Lungs: No use of accessory muscles, no wheeze on normal breath, mild exp wheeze on forced exp  Cardiovascular: RRR, heart sounds normal, no murmur or gallops, no peripheral edema  Musculoskeletal: No deformities, no cyanosis or clubbing  Neuro: alert, non focal  Skin: Warm, no lesions or rashes   COPD (chronic obstructive pulmonary disease) Not currently on long-acting BD, has poorly tolerated in the past. We discussed possibly starting a newer med. He is not interested at this time.   Pulmonary nodule, right Stable by CT chest 03/2013 for 2 years.   History of tobacco abuse Discussed lung cancer screening with him today. He is interested in having low-dose CT scan screening. I think there is an additional benefit here in that he does have a history of rheumatoid and methotrexate use this will allow me to follow his parenchyma as well.

## 2014-09-24 NOTE — Assessment & Plan Note (Signed)
Not currently on long-acting BD, has poorly tolerated in the past. We discussed possibly starting a newer med. He is not interested at this time.

## 2014-09-24 NOTE — Assessment & Plan Note (Signed)
Stable by CT chest 03/2013 for 2 years.

## 2014-09-24 NOTE — Addendum Note (Signed)
Addended by: Desmond Dike C on: 09/24/2014 02:39 PM   Modules accepted: Orders

## 2014-09-24 NOTE — Patient Instructions (Addendum)
Please keep your albuterol available to use 2 puffs as needed for shortness of breath We discussed Lung Cancer Screening today.  We will refer you to discuss CT scan screening next Fall.  Follow with Dr Lamonte Sakai in 6 months or sooner if you have any problems

## 2014-09-27 ENCOUNTER — Telehealth: Payer: Self-pay | Admitting: Acute Care

## 2014-09-27 NOTE — Telephone Encounter (Signed)
I called Andrew [name] today to clarify his smoking history. Dr. Lamonte Sakai had referred this patient to the lung cancer screening program. I was able to confirm that this patient has been smoke free for 17-18 years, which means he does not meet the criteria ( quit within the last  15 years ) for the program. I explained this to [name]. I told him that Dr. Lamonte Sakai will continue to follow his nodules through the office as he has been doing. He verbalized understanding. I will message Dr. Lamonte Sakai and let him know this patient will need to have his CT's scheduled through the office, as he does not qualify for the program.

## 2014-10-08 ENCOUNTER — Other Ambulatory Visit: Payer: Self-pay | Admitting: Emergency Medicine

## 2014-10-08 DIAGNOSIS — R911 Solitary pulmonary nodule: Secondary | ICD-10-CM

## 2014-10-10 DIAGNOSIS — G894 Chronic pain syndrome: Secondary | ICD-10-CM | POA: Diagnosis not present

## 2014-10-10 DIAGNOSIS — M79642 Pain in left hand: Secondary | ICD-10-CM | POA: Diagnosis not present

## 2014-10-10 DIAGNOSIS — M79641 Pain in right hand: Secondary | ICD-10-CM | POA: Diagnosis not present

## 2014-10-10 DIAGNOSIS — Z79899 Other long term (current) drug therapy: Secondary | ICD-10-CM | POA: Diagnosis not present

## 2014-10-10 DIAGNOSIS — R110 Nausea: Secondary | ICD-10-CM | POA: Diagnosis not present

## 2014-10-10 DIAGNOSIS — M0579 Rheumatoid arthritis with rheumatoid factor of multiple sites without organ or systems involvement: Secondary | ICD-10-CM | POA: Diagnosis not present

## 2014-10-10 DIAGNOSIS — M150 Primary generalized (osteo)arthritis: Secondary | ICD-10-CM | POA: Diagnosis not present

## 2014-10-11 ENCOUNTER — Ambulatory Visit (INDEPENDENT_AMBULATORY_CARE_PROVIDER_SITE_OTHER)
Admission: RE | Admit: 2014-10-11 | Discharge: 2014-10-11 | Disposition: A | Discharge status: Home / Self Care | Payer: BLUE CROSS/BLUE SHIELD | Source: Ambulatory Visit | Attending: Emergency Medicine | Admitting: Emergency Medicine

## 2014-10-11 DIAGNOSIS — R918 Other nonspecific abnormal finding of lung field: Secondary | ICD-10-CM | POA: Diagnosis not present

## 2014-10-11 DIAGNOSIS — R911 Solitary pulmonary nodule: Secondary | ICD-10-CM

## 2014-11-18 DIAGNOSIS — G8929 Other chronic pain: Secondary | ICD-10-CM | POA: Diagnosis not present

## 2014-11-18 DIAGNOSIS — Z79891 Long term (current) use of opiate analgesic: Secondary | ICD-10-CM | POA: Diagnosis not present

## 2014-11-18 DIAGNOSIS — M5442 Lumbago with sciatica, left side: Secondary | ICD-10-CM | POA: Diagnosis not present

## 2014-11-18 DIAGNOSIS — G894 Chronic pain syndrome: Secondary | ICD-10-CM | POA: Diagnosis not present

## 2015-01-13 DIAGNOSIS — M0579 Rheumatoid arthritis with rheumatoid factor of multiple sites without organ or systems involvement: Secondary | ICD-10-CM | POA: Diagnosis not present

## 2015-01-13 DIAGNOSIS — Z79899 Other long term (current) drug therapy: Secondary | ICD-10-CM | POA: Diagnosis not present

## 2015-01-13 DIAGNOSIS — M150 Primary generalized (osteo)arthritis: Secondary | ICD-10-CM | POA: Diagnosis not present

## 2015-01-13 DIAGNOSIS — G894 Chronic pain syndrome: Secondary | ICD-10-CM | POA: Diagnosis not present

## 2015-01-24 DIAGNOSIS — R3915 Urgency of urination: Secondary | ICD-10-CM | POA: Diagnosis not present

## 2015-01-24 DIAGNOSIS — N3281 Overactive bladder: Secondary | ICD-10-CM | POA: Diagnosis not present

## 2015-01-24 DIAGNOSIS — N401 Enlarged prostate with lower urinary tract symptoms: Secondary | ICD-10-CM | POA: Diagnosis not present

## 2015-01-24 DIAGNOSIS — N500 Atrophy of testis: Secondary | ICD-10-CM | POA: Diagnosis not present

## 2015-01-24 DIAGNOSIS — N138 Other obstructive and reflux uropathy: Secondary | ICD-10-CM | POA: Diagnosis not present

## 2015-01-24 DIAGNOSIS — R351 Nocturia: Secondary | ICD-10-CM | POA: Diagnosis not present

## 2015-02-17 DIAGNOSIS — G8929 Other chronic pain: Secondary | ICD-10-CM | POA: Diagnosis not present

## 2015-02-17 DIAGNOSIS — M059 Rheumatoid arthritis with rheumatoid factor, unspecified: Secondary | ICD-10-CM | POA: Diagnosis not present

## 2015-02-17 DIAGNOSIS — G894 Chronic pain syndrome: Secondary | ICD-10-CM | POA: Diagnosis not present

## 2015-02-17 DIAGNOSIS — M5442 Lumbago with sciatica, left side: Secondary | ICD-10-CM | POA: Diagnosis not present

## 2015-02-25 DIAGNOSIS — N3281 Overactive bladder: Secondary | ICD-10-CM | POA: Diagnosis not present

## 2015-02-25 DIAGNOSIS — R3915 Urgency of urination: Secondary | ICD-10-CM | POA: Diagnosis not present

## 2015-02-25 DIAGNOSIS — R351 Nocturia: Secondary | ICD-10-CM | POA: Diagnosis not present

## 2015-03-31 ENCOUNTER — Encounter: Payer: Self-pay | Admitting: Emergency Medicine

## 2015-03-31 ENCOUNTER — Ambulatory Visit (INDEPENDENT_AMBULATORY_CARE_PROVIDER_SITE_OTHER)
Admission: RE | Admit: 2015-03-31 | Discharge: 2015-03-31 | Disposition: A | Discharge status: Home / Self Care | Payer: BLUE CROSS/BLUE SHIELD | Source: Ambulatory Visit | Attending: Emergency Medicine | Admitting: Emergency Medicine

## 2015-03-31 ENCOUNTER — Ambulatory Visit (INDEPENDENT_AMBULATORY_CARE_PROVIDER_SITE_OTHER): Payer: BLUE CROSS/BLUE SHIELD | Admitting: Emergency Medicine

## 2015-03-31 VITALS — BP 98/68 | HR 65 | Ht 70.0 in | Wt 184.6 lb

## 2015-03-31 DIAGNOSIS — J449 Chronic obstructive pulmonary disease, unspecified: Secondary | ICD-10-CM | POA: Diagnosis not present

## 2015-03-31 DIAGNOSIS — Z23 Encounter for immunization: Secondary | ICD-10-CM | POA: Diagnosis not present

## 2015-03-31 DIAGNOSIS — R0602 Shortness of breath: Secondary | ICD-10-CM

## 2015-03-31 MED ORDER — TIOTROPIUM BROMIDE-OLODATEROL 2.5-2.5 MCG/ACT IN AERS: 2.0000 | INHALATION_SPRAY | Freq: Every day | RESPIRATORY_TRACT | Status: DC

## 2015-03-31 NOTE — Progress Notes (Signed)
History of Present Illness:  65 yo former smoker, hx of RA on chronic pred and MTX (started 2011). Childhood asthma that he grew out of. He has had trouble as an adult when he gets a URI. Has been freq treated with He has exertional dyspnea that is worse over about 2 months. Hears wheezing with exertion. As part eval had CXR, not available at this time. He tell me that the film was abnormal, not sure what it showed. Has seen Dr Alva Garnet before, has been on Advair, albuterol, maybe others.   ROV 01/09/10 -- dyspnea, hx tobacco, RA/MTX. Has been on Spiriva and Advair before per old chart in 05, 06. PFT today show moderate AFL, borderline BD response, no restriction. Normal DLCO. Has siblings w COPD.   ROV 09/17/11 -- 22 yo man, former tobacco, hx RA on MTX and humira. Last time 2011 we showed that he has moderate AFL on spiro. He tells me that he is having progressive SOB over many months time. We had planned to restart BD's last time. He isn't using anything right now. He didn't tolerate Spiriva or Advair - felt bad when he used them. He describes throat and esophageal pain that happens acutely and then resolves - ? Nutcracker esophagus.   ROV 10/18/11 -- 65 yo man, former tobacco, hx RA on MTX and humira.  Last visit we started Symbicort + SABA prn (had AFL on spiro 2011). No evidence ILD on CXR last time (for RA and MTX). He isn't sure that the Symbicort has helped him. He hasn't uses the albuterol   ROV 04/27/12 --  65 yo man, former tobacco and COPD (moderate AFL on spiro), hx RA on MTX and humira. Returns for f/u. He stopped symbicort after no benefit on therapeutic trial.  He tells me that his breathing has been stable. Still gets out of breath with heavy lifting, moving too quickly. He has SABA but never uses.   ROV 02/28/13 -- COPD and RA on MTX and remicade. He was admitted for a RUL CAP on 02/09/13 at [facility]. The dx was made by CT scan chest 1/3, with evidence for some bronchiectatic change and GGI, also  showed a RUL cavity vs bleb. Needs a repeat in 2 mon. He is feeling better but not back to full strength. He is having exertional dyspnea.  His only inhaled med right now is ProAir.   ROV 03/29/13 -- hx COPD, RA on MTX, plaquanil and remicade. CT chest 02/10/13 with RUL cavity/bleb, bronchiectasis, GGI. He returns today to review repeat CT done 03/26/13 > full resolution, 2 stable nodules < 43mm. He still doesn't feel full speed yet. His RA and joints are flaring. He uses ProAir rarely. He has not really benefited from Spiriva, Symbicort, Advair in the past.   ROV 05/01/13 -- hx COPD, RA on MTX, plaquanil and remicade. Following pulm nodules by CT scans last in 03/2013. He is just getting over a GI bug > feeling some better. We did a trial of brovana to see if he would benefit > no improvement in his breathing. No other changes.   ROV 10/29/13 -- hx COPD, RA on MTX, plaquanil and remicade. Following pulm nodules, next scan due 03/2014.  He had some increased SOB through the summer, but has overall been stable. He was seen at [facility] for HA and dx with a sinusitis. He is improved now. He has not needed SABA.  ROV 03/28/14 -- follow-up visit for COPD and rheumatoid arthritis on methotrexate, Plaquenil and Remicade. We have been following pulmonary nodules by CT scan, documented stable now over 2 years. He has 120 pk-yrs tobacco and would qualify for LDCT screening. He is having a lot of joint pain - he isn't sure that remicade has helped him. He is talking to Dr Trudie Reed about making a change. He rarely uses albuterol, feels that his exercise tolerance is good.   ROV 09/24/14 -- Follow-up visit for COPD and a significant  tobacco history, rheumatoid arthritis on methotrexate, Plaquenil. Was changed off remicade to another biologic, but currently he is not receiving because Dr Trudie Reed has changed location. We have followed pulmonary nodules on his CT scan of the chest that were documented stable 2 years on his last scan 03/2014.  He is still having poly-joint pain on the current meds. Not currently on scheduled BD. He has had exertional SOB, fatigue. Rarely uses albuterol. He has been on long-acting meds before but they haven't seemed to help him.   ROV 03/31/15 -- Patient with a history of COPD, rheumatoid arthritis on methotrexate and Plaquenil. Also been treated in the past with Remicade. He has a history of pulmonary nodules that were followed by CT scan of the chest and were stable over 2 years. Last CT was in 9/16 He is interested in getting back on another biologic with Dr Trudie Reed. He believes that he has had more DOE, especially when carrying things. Has also noticed some dyspnea at rest. His FEV1 in 2011 was 61% predicted.  He is on some new medication for bladder control per urology - can't recall the name.    Filed Vitals:   03/31/15 1420  BP: 98/68  Pulse: 65  Height: 5\' 10"  (1.778 m)  Weight: 184 lb 9.6 oz (83.734 kg)  SpO2: 96%   Gen: Pleasant, well-nourished, in no distress,  normal affect  ENT: No lesions,  mouth clear,  oropharynx clear, no postnasal drip  Neck: No JVD, no TMG, no carotid bruits  Lungs: No use of accessory muscles, no wheeze on normal breath, mild exp wheeze on forced exp  Cardiovascular: RRR, heart sounds normal, no murmur or gallops, no peripheral edema  Musculoskeletal: No deformities, no cyanosis or clubbing  Neuro: alert, non focal  Skin: Warm, no lesions or rashes    10/2014 --  COMPARISON: 03/26/2014  FINDINGS: Mediastinum/Nodes: Tortuous thoracic aorta. Aortic and branch vessel atherosclerosis. Normal heart size, without pericardial effusion. No mediastinal or  definite hilar adenopathy, given limitations of unenhanced CT.  Lungs/Pleura: No pleural fluid. Secretions within the trachea and left mainstem bronchus. Lower lobe predominant bronchial wall thickening. Severe centrilobular emphysema.  Subpleural right middle lobe linear 4 millimeter density in image 46 of series 3 is unchanged.  A subpleural 3-4 millimeter right lower lobe nodule on image 47 is similar.  Left lower lobe 4 millimeter nodule on image 39 is unchanged.  Upper abdomen: Normal imaged portions of the liver, spleen, stomach, pancreas, adrenal glands, left kidney.  Musculoskeletal: Dorsal spinal stimulator. Thoracic spondylosis.  IMPRESSION: 1. Advanced centrilobular emphysema with similar bilateral pulmonary nodules. 2. No acute process in the chest   COPD (chronic obstructive pulmonary disease) We will try starting Stiolto 2 puffs once a day to see if you benefit.  Flu shot today  Follow with Dr Lamonte Sakai next available to discuss how you are doing and your CXR results.   DYSPNEA Increased dyspnea since last visit. Certainly this could  be related to his COPD which we have never effectively treated (he didn't benefit only tried Spiriva in the past). I do need to ensure that this is not a change related to methotrexate use. He will have a chest x-ray today. Depending on the results and his response to Spiriva we may need to repeat his CT scan of the chest.

## 2015-03-31 NOTE — Patient Instructions (Addendum)
We will perform a CXR today We will try starting Stiolto 2 puffs once a day to see if you benefit.  Flu shot today  Follow with Dr Lamonte Sakai next available to discuss how you are doing and your CXR results.

## 2015-03-31 NOTE — Assessment & Plan Note (Signed)
Increased dyspnea since last visit. Certainly this could be related to his COPD which we have never effectively treated (he didn't benefit only tried Spiriva in the past). I do need to ensure that this is not a change related to methotrexate use. He will have a chest x-ray today. Depending on the results and his response to Spiriva we may need to repeat his CT scan of the chest.

## 2015-03-31 NOTE — Assessment & Plan Note (Signed)
We will try starting Stiolto 2 puffs once a day to see if you benefit.  Flu shot today  Follow with Dr Lamonte Sakai next available to discuss how you are doing and your CXR results.

## 2015-04-14 DIAGNOSIS — Z79899 Other long term (current) drug therapy: Secondary | ICD-10-CM | POA: Diagnosis not present

## 2015-04-14 DIAGNOSIS — G894 Chronic pain syndrome: Secondary | ICD-10-CM | POA: Diagnosis not present

## 2015-04-14 DIAGNOSIS — M150 Primary generalized (osteo)arthritis: Secondary | ICD-10-CM | POA: Diagnosis not present

## 2015-04-14 DIAGNOSIS — M0579 Rheumatoid arthritis with rheumatoid factor of multiple sites without organ or systems involvement: Secondary | ICD-10-CM | POA: Diagnosis not present

## 2015-04-14 DIAGNOSIS — R5383 Other fatigue: Secondary | ICD-10-CM | POA: Diagnosis not present

## 2015-04-22 ENCOUNTER — Ambulatory Visit (INDEPENDENT_AMBULATORY_CARE_PROVIDER_SITE_OTHER): Payer: BLUE CROSS/BLUE SHIELD | Admitting: Emergency Medicine

## 2015-04-22 ENCOUNTER — Encounter: Payer: Self-pay | Admitting: Emergency Medicine

## 2015-04-22 VITALS — BP 112/62 | HR 60 | Ht 70.0 in | Wt 185.0 lb

## 2015-04-22 DIAGNOSIS — J449 Chronic obstructive pulmonary disease, unspecified: Secondary | ICD-10-CM

## 2015-04-22 MED ORDER — ALBUTEROL SULFATE HFA 108 (90 BASE) MCG/ACT IN AERS: 2.0000 | INHALATION_SPRAY | Freq: Four times a day (QID) | RESPIRATORY_TRACT | Status: DC | PRN

## 2015-04-22 NOTE — Assessment & Plan Note (Signed)
No great change in his overall breathing or functional capacity with the addition of Stiolto. He is going to continue it for another month since he has the medication already. He will decide during that period time whether to continue it indefinitely. We discussed strategies for using albuterol in response to dyspnea and also for exercise.

## 2015-04-22 NOTE — Progress Notes (Signed)
History of Present Illness:  65 yo former smoker, hx of RA on chronic pred and MTX (started 2011). Childhood asthma that he grew out of. He has had trouble as an adult when he gets a URI. Has been freq treated with He has exertional dyspnea that is worse over about 2 months. Hears wheezing with exertion. As part eval had CXR, not available at this time. He tell me that the film was abnormal, not sure what it showed. Has seen Dr Alva Garnet before, has been on Advair, albuterol, maybe others.   ROV 01/09/10 -- dyspnea, hx tobacco, RA/MTX. Has been on Spiriva and Advair before per old chart in 05, 06. PFT today show moderate AFL, borderline BD response, no restriction. Normal DLCO. Has siblings w COPD.   ROV 09/17/11 -- 22 yo man, former tobacco, hx RA on MTX and humira. Last time 2011 we showed that he has moderate AFL on spiro. He tells me that he is having progressive SOB over many months time. We had planned to restart BD's last time. He isn't using anything right now. He didn't tolerate Spiriva or Advair - felt bad when he used them. He describes throat and esophageal pain that happens acutely and then resolves - ? Nutcracker esophagus.   ROV 10/18/11 -- 65 yo man, former tobacco, hx RA on MTX and humira.  Last visit we started Symbicort + SABA prn (had AFL on spiro 2011). No evidence ILD on CXR last time (for RA and MTX). He isn't sure that the Symbicort has helped him. He hasn't uses the albuterol   ROV 04/27/12 --  65 yo man, former tobacco and COPD (moderate AFL on spiro), hx RA on MTX and humira. Returns for f/u. He stopped symbicort after no benefit on therapeutic trial.  He tells me that his breathing has been stable. Still gets out of breath with heavy lifting, moving too quickly. He has SABA but never uses.   ROV 02/28/13 -- COPD and RA on MTX and remicade. He was admitted for a RUL CAP on 02/09/13 at [facility]. The dx was made by CT scan chest 1/3, with evidence for some bronchiectatic change and GGI, also  showed a RUL cavity vs bleb. Needs a repeat in 2 mon. He is feeling better but not back to full strength. He is having exertional dyspnea.  His only inhaled med right now is ProAir.   ROV 03/29/13 -- hx COPD, RA on MTX, plaquanil and remicade. CT chest 02/10/13 with RUL cavity/bleb, bronchiectasis, GGI. He returns today to review repeat CT done 03/26/13 > full resolution, 2 stable nodules < 43mm. He still doesn't feel full speed yet. His RA and joints are flaring. He uses ProAir rarely. He has not really benefited from Spiriva, Symbicort, Advair in the past.   ROV 05/01/13 -- hx COPD, RA on MTX, plaquanil and remicade. Following pulm nodules by CT scans last in 03/2013. He is just getting over a GI bug > feeling some better. We did a trial of brovana to see if he would benefit > no improvement in his breathing. No other changes.   ROV 10/29/13 -- hx COPD, RA on MTX, plaquanil and remicade. Following pulm nodules, next scan due 03/2014.  He had some increased SOB through the summer, but has overall been stable. He was seen at [facility] for HA and dx with a sinusitis. He is improved now. He has not needed SABA.  ROV 03/28/14 -- follow-up visit for COPD and rheumatoid arthritis on methotrexate, Plaquenil and Remicade. We have been following pulmonary nodules by CT scan, documented stable now over 2 years. He has 120 pk-yrs tobacco and would qualify for LDCT screening. He is having a lot of joint pain - he isn't sure that remicade has helped him. He is talking to Dr Trudie Reed about making a change. He rarely uses albuterol, feels that his exercise tolerance is good.   ROV 09/24/14 -- Follow-up visit for COPD and a significant  tobacco history, rheumatoid arthritis on methotrexate, Plaquenil. Was changed off remicade to another biologic, but currently he is not receiving because Dr Trudie Reed has changed location. We have followed pulmonary nodules on his CT scan of the chest that were documented stable 2 years on his last scan 03/2014.  He is still having poly-joint pain on the current meds. Not currently on scheduled BD. He has had exertional SOB, fatigue. Rarely uses albuterol. He has been on long-acting meds before but they haven't seemed to help him.   ROV 03/31/15 -- Patient with a history of COPD, rheumatoid arthritis on methotrexate and Plaquenil. Also been treated in the past with Remicade. He has a history of pulmonary nodules that were followed by CT scan of the chest and were stable over 2 years. Last CT was in 9/16 He is interested in getting back on another biologic with Dr Trudie Reed. He believes that he has had more DOE, especially when carrying things. Has also noticed some dyspnea at rest. His FEV1 in 2011 was 61% predicted.  He is on some new medication for bladder control per urology - can't recall the name.   ROV 04/22/15 -- follow-up visit for history of COPD. He also has rheumatoid arthritis since been treated with methotrexate. His also been on Remicade in the past. At our last visit I asked him to start Stiolto to see if he would benefit.  He is not sure whether it is doing much for him. He still gets SOB with walking the dog and with heavy lifting.  A chest x-ray performed at his last visit showed some mild evidence for interstitial thickening in the mid and lower lungs without any significant interval change compared with January 2015.  He needs a new SABA script.    Filed Vitals:   04/22/15 1538 04/22/15 1539  BP:  112/62  Pulse:  60  Height: 5\' 10"  (1.778 m)   Weight: 185 lb (83.915 kg)   SpO2:  95%   Gen: Pleasant, well-nourished, in no distress,  normal affect  ENT: No lesions,  mouth clear,  oropharynx  clear, no postnasal drip  Neck: No JVD, no TMG, no carotid bruits  Lungs: No use of accessory muscles, no wheeze on normal breath, mild exp wheeze on forced exp  Cardiovascular: RRR, heart sounds normal, no murmur or gallops, no peripheral edema  Musculoskeletal: No deformities, no cyanosis or clubbing  Neuro: alert, non focal  Skin: Warm, no lesions or rashes    10/2014 --  COMPARISON: 03/26/2014  FINDINGS: Mediastinum/Nodes: Tortuous thoracic aorta. Aortic and branch vessel atherosclerosis. Normal heart size, without pericardial effusion. No mediastinal or definite hilar adenopathy, given limitations of unenhanced CT.  Lungs/Pleura: No pleural fluid. Secretions within the trachea and left mainstem bronchus. Lower lobe predominant bronchial wall thickening. Severe centrilobular emphysema.  Subpleural right middle lobe linear 4 millimeter density in image 46 of series 3 is unchanged.  A subpleural 3-4 millimeter right lower lobe nodule  on image 47 is similar.  Left lower lobe 4 millimeter nodule on image 39 is unchanged.  Upper abdomen: Normal imaged portions of the liver, spleen, stomach, pancreas, adrenal glands, left kidney.  Musculoskeletal: Dorsal spinal stimulator. Thoracic spondylosis.  IMPRESSION: 1. Advanced centrilobular emphysema with similar bilateral pulmonary nodules. 2. No acute process in the chest   COPD (chronic obstructive pulmonary disease) No great change in his overall breathing or functional capacity with the addition of Stiolto. He is going to continue it for another month since he has the medication already. He will decide during that period time whether to continue it indefinitely. We discussed strategies for using albuterol in response to dyspnea and also for exercise.  ARTHRITIS, RHEUMATOID No evidence on chest x-ray from last visit for any increase in interstitial inflammation or markings due to either methotrexate or  rheumatoid arthritis.

## 2015-04-22 NOTE — Assessment & Plan Note (Signed)
No evidence on chest x-ray from last visit for any increase in interstitial inflammation or markings due to either methotrexate or rheumatoid arthritis.

## 2015-04-22 NOTE — Patient Instructions (Signed)
Please continue taking Stiolto once a day. Please call our office and let us know if he decides to stay on this medication after you've used it for another month Continue albuterol for shortness of breath. He may want to try using this prior to exertion to see if it helps with your breathing while you are pushing yourself Chest x-ray shows no evidence for new inflammation or scarring related to either methotrexate or to rheumatoid arthritis Follow with Dr Lamonte Sakai in 6 months or sooner if you have any problems

## 2015-04-22 NOTE — Addendum Note (Signed)
Addended by: Desmond Dike C on: 04/22/2015 04:25 PM   Modules accepted: Orders, SmartSet

## 2015-04-28 DIAGNOSIS — R3915 Urgency of urination: Secondary | ICD-10-CM | POA: Diagnosis not present

## 2015-04-28 DIAGNOSIS — N401 Enlarged prostate with lower urinary tract symptoms: Secondary | ICD-10-CM | POA: Diagnosis not present

## 2015-04-28 DIAGNOSIS — R351 Nocturia: Secondary | ICD-10-CM | POA: Diagnosis not present

## 2015-04-28 DIAGNOSIS — N3281 Overactive bladder: Secondary | ICD-10-CM | POA: Diagnosis not present

## 2015-04-28 DIAGNOSIS — N138 Other obstructive and reflux uropathy: Secondary | ICD-10-CM | POA: Diagnosis not present

## 2015-05-14 DIAGNOSIS — M0579 Rheumatoid arthritis with rheumatoid factor of multiple sites without organ or systems involvement: Secondary | ICD-10-CM | POA: Diagnosis not present

## 2015-05-15 DIAGNOSIS — M545 Low back pain: Secondary | ICD-10-CM | POA: Diagnosis not present

## 2015-05-15 DIAGNOSIS — Z79891 Long term (current) use of opiate analgesic: Secondary | ICD-10-CM | POA: Diagnosis not present

## 2015-05-15 DIAGNOSIS — G8929 Other chronic pain: Secondary | ICD-10-CM | POA: Diagnosis not present

## 2015-05-15 DIAGNOSIS — M5442 Lumbago with sciatica, left side: Secondary | ICD-10-CM | POA: Diagnosis not present

## 2015-05-15 DIAGNOSIS — G894 Chronic pain syndrome: Secondary | ICD-10-CM | POA: Diagnosis not present

## 2015-06-05 DIAGNOSIS — R5383 Other fatigue: Secondary | ICD-10-CM | POA: Diagnosis not present

## 2015-06-05 DIAGNOSIS — G894 Chronic pain syndrome: Secondary | ICD-10-CM | POA: Diagnosis not present

## 2015-06-05 DIAGNOSIS — M0579 Rheumatoid arthritis with rheumatoid factor of multiple sites without organ or systems involvement: Secondary | ICD-10-CM | POA: Diagnosis not present

## 2015-06-05 DIAGNOSIS — M150 Primary generalized (osteo)arthritis: Secondary | ICD-10-CM | POA: Diagnosis not present

## 2015-06-05 DIAGNOSIS — Z79899 Other long term (current) drug therapy: Secondary | ICD-10-CM | POA: Diagnosis not present

## 2015-06-11 DIAGNOSIS — M0579 Rheumatoid arthritis with rheumatoid factor of multiple sites without organ or systems involvement: Secondary | ICD-10-CM | POA: Diagnosis not present

## 2015-06-13 DIAGNOSIS — R3915 Urgency of urination: Secondary | ICD-10-CM | POA: Diagnosis not present

## 2015-06-13 DIAGNOSIS — Z0000 Encounter for general adult medical examination without abnormal findings: Secondary | ICD-10-CM | POA: Diagnosis not present

## 2015-06-13 DIAGNOSIS — R351 Nocturia: Secondary | ICD-10-CM | POA: Diagnosis not present

## 2015-06-21 DIAGNOSIS — J069 Acute upper respiratory infection, unspecified: Secondary | ICD-10-CM | POA: Diagnosis not present

## 2015-06-21 DIAGNOSIS — J309 Allergic rhinitis, unspecified: Secondary | ICD-10-CM | POA: Diagnosis not present

## 2015-06-21 DIAGNOSIS — Z6826 Body mass index (BMI) 26.0-26.9, adult: Secondary | ICD-10-CM | POA: Diagnosis not present

## 2015-06-24 DIAGNOSIS — I889 Nonspecific lymphadenitis, unspecified: Secondary | ICD-10-CM | POA: Diagnosis not present

## 2015-06-24 DIAGNOSIS — A6920 Lyme disease, unspecified: Secondary | ICD-10-CM | POA: Diagnosis not present

## 2015-06-24 DIAGNOSIS — Z6826 Body mass index (BMI) 26.0-26.9, adult: Secondary | ICD-10-CM | POA: Diagnosis not present

## 2015-06-24 DIAGNOSIS — W57XXXA Bitten or stung by nonvenomous insect and other nonvenomous arthropods, initial encounter: Secondary | ICD-10-CM | POA: Diagnosis not present

## 2015-06-24 DIAGNOSIS — R21 Rash and other nonspecific skin eruption: Secondary | ICD-10-CM | POA: Diagnosis not present

## 2015-07-02 DIAGNOSIS — N401 Enlarged prostate with lower urinary tract symptoms: Secondary | ICD-10-CM | POA: Diagnosis not present

## 2015-07-02 DIAGNOSIS — N138 Other obstructive and reflux uropathy: Secondary | ICD-10-CM | POA: Diagnosis not present

## 2015-07-02 DIAGNOSIS — N3281 Overactive bladder: Secondary | ICD-10-CM | POA: Diagnosis not present

## 2015-07-03 ENCOUNTER — Other Ambulatory Visit: Payer: Self-pay | Admitting: Urology

## 2015-07-10 DIAGNOSIS — S30861A Insect bite (nonvenomous) of abdominal wall, initial encounter: Secondary | ICD-10-CM

## 2015-07-10 DIAGNOSIS — W57XXXA Bitten or stung by nonvenomous insect and other nonvenomous arthropods, initial encounter: Secondary | ICD-10-CM

## 2015-07-10 HISTORY — DX: Insect bite (nonvenomous) of abdominal wall, initial encounter: S30.861A

## 2015-07-10 HISTORY — DX: Bitten or stung by nonvenomous insect and other nonvenomous arthropods, initial encounter: W57.XXXA

## 2015-07-18 ENCOUNTER — Encounter (HOSPITAL_BASED_OUTPATIENT_CLINIC_OR_DEPARTMENT_OTHER): Payer: Self-pay | Admitting: *Deleted

## 2015-07-18 NOTE — Progress Notes (Signed)
Pt instructed npo pmn 6/14 x clears until 0600, then absolutely nothing by mouth.  Pt totake synthroid , use daily inhaler and bring albuterol rescue inhaler w/ him.  To [facility] 6/15 @ 1215. Needs istat, ekg on arrival.  Pt aware of overnight stay in [facility].

## 2015-07-24 ENCOUNTER — Encounter (HOSPITAL_COMMUNITY)
Admission: RE | Disposition: A | Discharge status: Home / Self Care | Payer: Self-pay | Source: Ambulatory Visit | Attending: Urology

## 2015-07-24 ENCOUNTER — Other Ambulatory Visit: Payer: Self-pay

## 2015-07-24 ENCOUNTER — Observation Stay (HOSPITAL_BASED_OUTPATIENT_CLINIC_OR_DEPARTMENT_OTHER)
Admission: RE | Admit: 2015-07-24 | Discharge: 2015-07-25 | Disposition: A | Discharge status: Home / Self Care | Payer: BLUE CROSS/BLUE SHIELD | Source: Ambulatory Visit | Attending: Urology | Admitting: Urology

## 2015-07-24 ENCOUNTER — Ambulatory Visit (HOSPITAL_BASED_OUTPATIENT_CLINIC_OR_DEPARTMENT_OTHER): Payer: BLUE CROSS/BLUE SHIELD | Admitting: Anesthesiology

## 2015-07-24 ENCOUNTER — Encounter (HOSPITAL_BASED_OUTPATIENT_CLINIC_OR_DEPARTMENT_OTHER): Payer: Self-pay | Admitting: Anesthesiology

## 2015-07-24 DIAGNOSIS — R3912 Poor urinary stream: Secondary | ICD-10-CM | POA: Diagnosis not present

## 2015-07-24 DIAGNOSIS — R3916 Straining to void: Secondary | ICD-10-CM | POA: Diagnosis not present

## 2015-07-24 DIAGNOSIS — M1990 Unspecified osteoarthritis, unspecified site: Secondary | ICD-10-CM | POA: Diagnosis not present

## 2015-07-24 DIAGNOSIS — Z87891 Personal history of nicotine dependence: Secondary | ICD-10-CM | POA: Insufficient documentation

## 2015-07-24 DIAGNOSIS — N401 Enlarged prostate with lower urinary tract symptoms: Secondary | ICD-10-CM | POA: Diagnosis not present

## 2015-07-24 DIAGNOSIS — J449 Chronic obstructive pulmonary disease, unspecified: Secondary | ICD-10-CM | POA: Insufficient documentation

## 2015-07-24 DIAGNOSIS — N138 Other obstructive and reflux uropathy: Secondary | ICD-10-CM | POA: Insufficient documentation

## 2015-07-24 DIAGNOSIS — N400 Enlarged prostate without lower urinary tract symptoms: Secondary | ICD-10-CM | POA: Diagnosis not present

## 2015-07-24 DIAGNOSIS — R351 Nocturia: Secondary | ICD-10-CM | POA: Insufficient documentation

## 2015-07-24 DIAGNOSIS — N3941 Urge incontinence: Secondary | ICD-10-CM | POA: Diagnosis not present

## 2015-07-24 DIAGNOSIS — Z96649 Presence of unspecified artificial hip joint: Secondary | ICD-10-CM | POA: Insufficient documentation

## 2015-07-24 DIAGNOSIS — Z96652 Presence of left artificial knee joint: Secondary | ICD-10-CM | POA: Diagnosis not present

## 2015-07-24 HISTORY — PX: TRANSURETHRAL RESECTION OF PROSTATE: SHX73

## 2015-07-24 HISTORY — DX: Insect bite (nonvenomous) of abdominal wall, initial encounter: S30.861A

## 2015-07-24 HISTORY — DX: Bitten or stung by nonvenomous insect and other nonvenomous arthropods, initial encounter: W57.XXXA

## 2015-07-24 LAB — POCT I-STAT 4, (NA,K, GLUC, HGB,HCT)
GLUCOSE: 92 mg/dL (ref 65–99)
HCT: 42 % (ref 39.0–52.0)
Hemoglobin: 14.3 g/dL (ref 13.0–17.0)
POTASSIUM: 4.1 mmol/L (ref 3.5–5.1)
Sodium: 140 mmol/L (ref 135–145)

## 2015-07-24 SURGERY — TRANSURETHRAL RESECTION OF THE PROSTATE WITH GYRUS INSTRUMENTS
Anesthesia: General | Site: Prostate

## 2015-07-24 MED ORDER — CIPROFLOXACIN IN D5W 400 MG/200ML IV SOLN
INTRAVENOUS | Status: DC | PRN
  Administered 2015-07-24: 400 mg via INTRAVENOUS

## 2015-07-24 MED ORDER — HYDROMORPHONE HCL 1 MG/ML IJ SOLN
0.2500 mg | INTRAMUSCULAR | Status: DC | PRN
  Administered 2015-07-24: 0.5 mg via INTRAVENOUS
  Filled 2015-07-24: qty 1

## 2015-07-24 MED ORDER — ONDANSETRON HCL 4 MG/2ML IJ SOLN
INTRAMUSCULAR | Status: DC | PRN
Start: 2015-07-24 — End: 2015-07-24
  Administered 2015-07-24 (×2): 4 mg via INTRAVENOUS

## 2015-07-24 MED ORDER — MEPERIDINE HCL 25 MG/ML IJ SOLN
6.2500 mg | INTRAMUSCULAR | Status: DC | PRN
  Filled 2015-07-24: qty 1

## 2015-07-24 MED ORDER — PROPOFOL 10 MG/ML IV BOLUS
INTRAVENOUS | Status: AC
  Filled 2015-07-24: qty 20

## 2015-07-24 MED ORDER — DEXTROSE-NACL 5-0.45 % IV SOLN
INTRAVENOUS | Status: DC
  Administered 2015-07-24: 18:00:00 via INTRAVENOUS

## 2015-07-24 MED ORDER — SULFAMETHOXAZOLE-TRIMETHOPRIM 800-160 MG PO TABS
1.0000 | ORAL_TABLET | Freq: Two times a day (BID) | ORAL | Status: DC
  Administered 2015-07-24 – 2015-07-25 (×2): 1 via ORAL
  Filled 2015-07-24 (×2): qty 1

## 2015-07-24 MED ORDER — FENTANYL CITRATE (PF) 100 MCG/2ML IJ SOLN
INTRAMUSCULAR | Status: DC | PRN
  Administered 2015-07-24 (×2): 50 ug via INTRAVENOUS

## 2015-07-24 MED ORDER — LEVOTHYROXINE SODIUM 50 MCG PO TABS
75.0000 ug | ORAL_TABLET | Freq: Every day | ORAL | Status: DC
  Administered 2015-07-25: 75 ug via ORAL
  Filled 2015-07-24: qty 1

## 2015-07-24 MED ORDER — SODIUM CHLORIDE 0.9 % IR SOLN
Status: DC | PRN
  Administered 2015-07-24: 18000 mL

## 2015-07-24 MED ORDER — BELLADONNA ALKALOIDS-OPIUM 16.2-60 MG RE SUPP: 1.0000 | Freq: Four times a day (QID) | RECTAL | Status: DC | PRN

## 2015-07-24 MED ORDER — MIDAZOLAM HCL 5 MG/5ML IJ SOLN
INTRAMUSCULAR | Status: DC | PRN
  Administered 2015-07-24: 2 mg via INTRAVENOUS

## 2015-07-24 MED ORDER — PROMETHAZINE HCL 25 MG/ML IJ SOLN
6.2500 mg | INTRAMUSCULAR | Status: DC | PRN
  Filled 2015-07-24: qty 1

## 2015-07-24 MED ORDER — KETOROLAC TROMETHAMINE 30 MG/ML IJ SOLN: INTRAMUSCULAR | Status: DC | PRN

## 2015-07-24 MED ORDER — CIPROFLOXACIN IN D5W 400 MG/200ML IV SOLN
400.0000 mg | INTRAVENOUS | Status: AC
  Administered 2015-07-24: 400 mg via INTRAVENOUS
  Filled 2015-07-24: qty 200

## 2015-07-24 MED ORDER — SENNOSIDES-DOCUSATE SODIUM 8.6-50 MG PO TABS
2.0000 | ORAL_TABLET | Freq: Every day | ORAL | Status: DC
  Administered 2015-07-24: 2 via ORAL
  Filled 2015-07-24: qty 2

## 2015-07-24 MED ORDER — SULFAMETHOXAZOLE-TRIMETHOPRIM 800-160 MG PO TABS: 1.0000 | ORAL_TABLET | Freq: Two times a day (BID) | ORAL | Status: DC

## 2015-07-24 MED ORDER — FENTANYL CITRATE (PF) 100 MCG/2ML IJ SOLN
INTRAMUSCULAR | Status: AC
  Filled 2015-07-24: qty 2

## 2015-07-24 MED ORDER — PROPOFOL 10 MG/ML IV BOLUS
INTRAVENOUS | Status: DC | PRN
  Administered 2015-07-24: 200 mg via INTRAVENOUS

## 2015-07-24 MED ORDER — MIDAZOLAM HCL 2 MG/2ML IJ SOLN
INTRAMUSCULAR | Status: AC
  Filled 2015-07-24: qty 2

## 2015-07-24 MED ORDER — LIDOCAINE HCL (CARDIAC) 20 MG/ML IV SOLN
INTRAVENOUS | Status: DC | PRN
  Administered 2015-07-24: 80 mg via INTRAVENOUS

## 2015-07-24 MED ORDER — ACETAMINOPHEN 325 MG PO TABS: 650.0000 mg | ORAL_TABLET | ORAL | Status: DC | PRN

## 2015-07-24 MED ORDER — ONDANSETRON HCL 4 MG/2ML IJ SOLN
INTRAMUSCULAR | Status: AC
  Filled 2015-07-24: qty 2

## 2015-07-24 MED ORDER — CIPROFLOXACIN IN D5W 400 MG/200ML IV SOLN
INTRAVENOUS | Status: AC
  Filled 2015-07-24: qty 200

## 2015-07-24 MED ORDER — LIDOCAINE HCL (CARDIAC) 20 MG/ML IV SOLN
INTRAVENOUS | Status: AC
  Filled 2015-07-24: qty 5

## 2015-07-24 MED ORDER — ALBUTEROL SULFATE HFA 108 (90 BASE) MCG/ACT IN AERS: 2.0000 | INHALATION_SPRAY | Freq: Four times a day (QID) | RESPIRATORY_TRACT | Status: DC | PRN

## 2015-07-24 MED ORDER — OXYCODONE-ACETAMINOPHEN 10-325 MG PO TABS: 1.0000 | ORAL_TABLET | ORAL | Status: DC | PRN

## 2015-07-24 MED ORDER — OXYCODONE-ACETAMINOPHEN 5-325 MG PO TABS
1.0000 | ORAL_TABLET | ORAL | Status: DC | PRN
  Administered 2015-07-24 – 2015-07-25 (×2): 1 via ORAL
  Filled 2015-07-24 (×2): qty 1

## 2015-07-24 MED ORDER — DEXAMETHASONE SODIUM PHOSPHATE 10 MG/ML IJ SOLN
INTRAMUSCULAR | Status: AC
  Filled 2015-07-24: qty 1

## 2015-07-24 MED ORDER — MORPHINE SULFATE 15 MG PO TABS: 30.0000 mg | ORAL_TABLET | Freq: Two times a day (BID) | ORAL | Status: DC

## 2015-07-24 MED ORDER — ONDANSETRON HCL 4 MG/2ML IJ SOLN: 4.0000 mg | INTRAMUSCULAR | Status: DC | PRN

## 2015-07-24 MED ORDER — HYDROMORPHONE HCL 1 MG/ML IJ SOLN
INTRAMUSCULAR | Status: AC
  Filled 2015-07-24: qty 1

## 2015-07-24 MED ORDER — ALBUTEROL SULFATE (2.5 MG/3ML) 0.083% IN NEBU: 2.5000 mg | INHALATION_SOLUTION | Freq: Four times a day (QID) | RESPIRATORY_TRACT | Status: DC | PRN

## 2015-07-24 MED ORDER — ZOLPIDEM TARTRATE 10 MG PO TABS
10.0000 mg | ORAL_TABLET | Freq: Every day | ORAL | Status: DC
  Administered 2015-07-24: 10 mg via ORAL
  Filled 2015-07-24: qty 1

## 2015-07-24 MED ORDER — MORPHINE SULFATE ER 30 MG PO TBCR
30.0000 mg | EXTENDED_RELEASE_TABLET | Freq: Two times a day (BID) | ORAL | Status: DC
  Administered 2015-07-24 – 2015-07-25 (×2): 30 mg via ORAL
  Filled 2015-07-24 (×2): qty 1

## 2015-07-24 MED ORDER — OXYCODONE HCL 5 MG PO TABS
5.0000 mg | ORAL_TABLET | ORAL | Status: DC | PRN
  Administered 2015-07-24 – 2015-07-25 (×2): 5 mg via ORAL
  Filled 2015-07-24 (×2): qty 1

## 2015-07-24 MED ORDER — LACTATED RINGERS IV SOLN
INTRAVENOUS | Status: DC
  Administered 2015-07-24: 13:00:00 via INTRAVENOUS
  Filled 2015-07-24: qty 1000

## 2015-07-24 SURGICAL SUPPLY — 37 items
BAG DRAIN URO-CYSTO SKYTR STRL (DRAIN) ×2 IMPLANT
BAG URINE DRAINAGE (UROLOGICAL SUPPLIES) ×2 IMPLANT
BAG URINE LEG 19OZ MD ST LTX (BAG) IMPLANT
CANISTER SUCT LVC 12 LTR MEDI- (MISCELLANEOUS) IMPLANT
CATH FOLEY 2WAY SLVR  5CC 20FR (CATHETERS)
CATH FOLEY 2WAY SLVR  5CC 22FR (CATHETERS)
CATH FOLEY 2WAY SLVR 30CC 22FR (CATHETERS) IMPLANT
CATH FOLEY 2WAY SLVR 5CC 20FR (CATHETERS) IMPLANT
CATH FOLEY 2WAY SLVR 5CC 22FR (CATHETERS) IMPLANT
CATH FOLEY 3WAY 30CC 22F (CATHETERS) IMPLANT
CATH FOLEY 3WAY 30CC 22FR (CATHETERS) ×2 IMPLANT
CATH HEMA 3WAY 30CC 24FR COUDE (CATHETERS) IMPLANT
CATH HEMA 3WAY 30CC 24FR RND (CATHETERS) IMPLANT
CLOTH BEACON ORANGE TIMEOUT ST (SAFETY) ×2 IMPLANT
ELECT BUTTON BIOP 24F 90D PLAS (MISCELLANEOUS) IMPLANT
ELECT REM PT RETURN 9FT ADLT (ELECTROSURGICAL) ×2
ELECTRODE REM PT RTRN 9FT ADLT (ELECTROSURGICAL) ×1 IMPLANT
EVACUATOR MICROVAS BLADDER (UROLOGICAL SUPPLIES) ×2 IMPLANT
GLOVE BIO SURGEON STRL SZ 6.5 (GLOVE) ×2 IMPLANT
GLOVE BIO SURGEON STRL SZ8 (GLOVE) ×2 IMPLANT
GLOVE BIOGEL PI IND STRL 6.5 (GLOVE) ×2 IMPLANT
GLOVE BIOGEL PI INDICATOR 6.5 (GLOVE) ×2
GOWN STRL REUS W/ TWL LRG LVL3 (GOWN DISPOSABLE) ×2 IMPLANT
GOWN STRL REUS W/ TWL XL LVL3 (GOWN DISPOSABLE) ×1 IMPLANT
GOWN STRL REUS W/TWL LRG LVL3 (GOWN DISPOSABLE) ×2
GOWN STRL REUS W/TWL XL LVL3 (GOWN DISPOSABLE) ×1
HOLDER FOLEY CATH W/STRAP (MISCELLANEOUS) ×2 IMPLANT
IV NS IRRIG 3000ML ARTHROMATIC (IV SOLUTION) ×12 IMPLANT
KIT ROOM TURNOVER WOR (KITS) ×2 IMPLANT
LOOP CUT BIPOLAR 24F LRG (ELECTROSURGICAL) ×2 IMPLANT
MANIFOLD NEPTUNE II (INSTRUMENTS) IMPLANT
PACK CYSTO (CUSTOM PROCEDURE TRAY) ×2 IMPLANT
PLUG CATH AND CAP STER (CATHETERS) IMPLANT
SET ASPIRATION TUBING (TUBING) ×2 IMPLANT
SYR 30ML LL (SYRINGE) IMPLANT
SYRINGE IRR TOOMEY STRL 70CC (SYRINGE) IMPLANT
TUBE CONNECTING 12X1/4 (SUCTIONS) IMPLANT

## 2015-07-24 NOTE — Discharge Instructions (Signed)
Transurethral Resection of the Prostate ° °Care After ° °Refer to this sheet in the next few weeks. These discharge instructions provide you with general information on caring for yourself after you leave the hospital. Your caregiver may also give you specific instructions. Your treatment has been planned according to the most current medical practices available, but unavoidable complications sometimes occur. If you have any problems or questions after discharge, please call your caregiver. ° °HOME CARE INSTRUCTIONS  ° °Medications °· You may receive medicine for pain management. As your level of discomfort decreases, adjustments in your pain medicines may be made.  °· Take all medicines as directed.  °· You may be given a medicine (antibiotic) to kill germs following surgery. Finish all medicines. Let your caregiver know if you have any side effects or problems from the medicine.  °· If you are on aspirin, it would be best not to restart the aspirin until the blood in the urine clears °Hygiene °· You can take a shower after surgery.  °· You should not take a bath while you still have the urethral catheter. °Activity °· You will be encouraged to get out of bed as much as possible and increase your activity level as tolerated.  °· Spend the first week in and around your home. For 3 weeks, avoid the following:  °· Straining.  °· Running.  °· Strenuous work.  °· Walks longer than a few blocks.  °· Riding for extended periods.  °· Sexual relations.  °· Do not lift heavy objects (more than 20 pounds) for at least 1 month. When lifting, use your arms instead of your abdominal muscles.  °· You will be encouraged to walk as tolerated. Do not exert yourself. Increase your activity level slowly. Remember that it is important to keep moving after an operation of any type. This cuts down on the possibility of developing blood clots.  °· Your caregiver will tell you when you can resume driving and light housework. Discuss this  at your first office visit after discharge. °Diet °· No special diet is ordered after a TURP. However, if you are on a special diet for another medical problem, it should be continued.  °· Normal fluid intake is usually recommended.  °· Avoid alcohol and caffeinated drinks for 2 weeks. They irritate the bladder. Decaffeinated drinks are okay.  °· Avoid spicy foods.  °Bladder Function °· For the first 10 days, empty the bladder whenever you feel a definite desire. Do not try to hold the urine for long periods of time.  °· Urinating once or twice a night even after you are healed is not uncommon.  °· You may see some recurrence of blood in the urine after discharge from the hospital. This usually happens within 2 weeks after the procedure.If this occurs, force fluids again as you did in the hospital and reduce your activity.  °Bowel Function °· You may experience some constipation after surgery. This can be minimized by increasing fluids and fiber in your diet. Drink enough water and fluids to keep your urine clear or pale yellow.  °· A stool softener may be prescribed for use at home. Do not strain to move your bowels.  °· If you are requiring increased pain medicine, it is important that you take stool softeners to prevent constipation. This will help to promote proper healing by reducing the need to strain to move your bowels.  °Sexual Activity °· Semen movement in the opposite direction and into the bladder (  retrograde ejaculation) may occur. Since the semen passes into the bladder, cloudy urine can occur the first time you urinate after intercourse. Or, you may not have an ejaculation during erection. Ask your caregiver when you can resume sexual activity. Retrograde ejaculation and reduced semen discharge should not reduce one's pleasure of intercourse.  °Postoperative Visit °· Arrange the date and time of your after surgery visit with your caregiver.  °Return to Work °· After your recovery is complete, you will  be able to return to work and resume all activities. Your caregiver will inform you when you can return to work.  °Foley Catheter Care °A soft, flexible tube (Foley catheter) may have been placed in your bladder to drain urine and fluid. Follow these instructions: °Taking Care of the Catheter °· Keep the area where the catheter leaves your body clean.  °· Attach the catheter to the leg so there is no tension on the catheter.  °· Keep the drainage bag below the level of the bladder, but keep it OFF the floor.  °· Do not take long soaking baths. Your caregiver will give instructions about showering.  °· Wash your hands before touching ANYTHING related to the catheter or bag.  °· Using mild soap and warm water on a washcloth:  °· Clean the area closest to the catheter insertion site using a circular motion around the catheter.  °· Clean the catheter itself by wiping AWAY from the insertion site for several inches down the tube.  °· NEVER wipe upward as this could sweep bacteria up into the urethra (tube in your body that normally drains the bladder) and cause infection.  °· Place a small amount of sterile lubricant at the tip of the penis where the catheter is entering.  °Taking Care of the Drainage Bags °· Two drainage bags may be taken home: a large overnight drainage bag, and a smaller leg bag which fits underneath clothing.  °· It is okay to wear the overnight bag at any time, but NEVER wear the smaller leg bag at night.  °· Keep the drainage bag well below the level of your bladder. This prevents backflow of urine into the bladder and allows the urine to drain freely.  °· Anchor the tubing to your leg to prevent pulling or tension on the catheter. Use tape or a leg strap provided by the hospital.  °· Empty the drainage bag when it is 1/2 to 3/4 full. Wash your hands before and after touching the bag.  °· Periodically check the tubing for kinks to make sure there is no pressure on the tubing which could restrict  the flow of urine.  °Changing the Drainage Bags °· Cleanse both ends of the clean bag with alcohol before changing.  °· Pinch off the rubber catheter to avoid urine spillage during the disconnection.  °· Disconnect the dirty bag and connect the clean one.  °· Empty the dirty bag carefully to avoid a urine spill.  °· Attach the new bag to the leg with tape or a leg strap.  °Cleaning the Drainage Bags °· Whenever a drainage bag is disconnected, it must be cleaned quickly so it is ready for the next use.  °· Wash the bag in warm, soapy water.  °· Rinse the bag thoroughly with warm water.  °· Soak the bag for 30 minutes in a solution of white vinegar and water (1 cup vinegar to 1 quart warm water).  °· Rinse with warm water.  °SEEK MEDICAL   CARE IF:  °· You have chills or night sweats.  °· You are leaking around your catheter or have problems with your catheter. It is not uncommon to have sporadic leakage around your catheter as a result of bladder spasms. If the leakage stops, there is not much need for concern. If you are uncertain, call your caregiver.  °· You develop side effects that you think are coming from your medicines.  °SEEK IMMEDIATE MEDICAL CARE IF:  °· You are suddenly unable to urinate. Check to see if there are any kinks in the drainage tubing that may cause this. If you cannot find any kinks, call your caregiver immediately. This is an emergency.  °· You develop shortness of breath or chest pains.  °· Bleeding persists or clots develop in your urine.  °· You have a fever.  °· You develop pain in your back or over your lower belly (abdomen).  °· You develop pain or swelling in your legs.  °· Any problems you are having get worse rather than better.  °MAKE SURE YOU:  °· Understand these instructions.  °· Will watch your condition.  °· Will get help right away if you are not doing well or get worse.  °Document Released: 01/25/2005 Document Revised: 10/07/2010 Document Reviewed: 09/18/2008 °[employer]®  Patient Information ©2012 [employer].Transurethral Resection of the Prostate °Care After °Refer to this sheet in the next few weeks. These discharge instructions provide you with general information on caring for yourself after you leave the hospital. Your caregiver may also give you specific instructions. Your treatment has been planned according to the most current medical practices available, but unavoidable complications sometimes occur. If you have any problems or questions after discharge, please call your caregiver. °

## 2015-07-24 NOTE — H&P (Signed)
Urology History and Physical Exam  CC: large prostate  HPI: 65 year old male presents at this time for TURP.  He has obstructive symptoms from BPH, and has urodynamic evidence of obstruction.  He does have a significant median lobe, which precludes use of a Urolift procedure.  He has been counseled in the procedure of TURP, risks and complications.  He desires to proceed.  PMH: Past Medical History  Diagnosis Date  . Insomnia   . Osteoarthritis   . Rheumatoid arteritis   . Inguinal hernia   . Aneurysm ([place])     POSTERIOR BRAIN - NO FOLLOW UP X 40 YRS  . PONV (postoperative nausea and vomiting)   . COPD (chronic obstructive pulmonary disease) ([place])   . Bruises easily   . H/O hiatal hernia   . Difficulty urinating   . Enlarged prostate   . Thyroid disease   . Asthma     "when I was real young"  . Emphysema   . Pneumonia 1970's; 2000's    "I've had it twice" (02/09/2013)  . Chronic bronchitis ([place])     "get it anytime I catch a cold" (02/09/2013)  . Exertional shortness of breath     "just walking from room to room sometimes" (02/09/2013)  . Hypothyroidism   . Hepatitis     "I've been told I had hepatitis; had yellow jaundice when I wa young" (02/09/2013)  . Daily headache     "here lately" (02/09/2013)  . Chronic back pain   . Fibromyalgia   . Collagen vascular disease ([place])   . Tick bite of abdomen 07/10/15    completed doxycycline Rx. had rash from abdomen and down anterior thighs    PSH: Past Surgical History  Procedure Laterality Date  . Total hip arthroplasty Left   . Replacement total knee Left   . Hammer toe surgery Bilateral   . Lumbar disc surgery    . Lumbar spine surgery  X2  . Shoulder arthroscopy w/ rotator cuff repair Left   . Carpal tunnel release Left   . Knee arthroscopy Bilateral   . Spinal cord stimulator implant  2010  . Esophagogastroduodenoscopy (egd) with esophageal dilation    . Nasal sinus surgery      X 3  . Inguinal hernia repair  01/28/2012     Procedure: LAPAROSCOPIC BILATERAL INGUINAL HERNIA REPAIR;  Surgeon: Ralene Ok, MD;  Location: [facility] ORS;  Service: General;  Laterality: Bilateral;  . Insertion of mesh  01/28/2012    Procedure: INSERTION OF MESH;  Surgeon: Ralene Ok, MD;  Location: [facility] ORS;  Service: General;  Laterality: Bilateral;  . Hernia repair    . Eye surgery Bilateral 2015    cataract w IOL    Allergies: Allergies  Allergen Reactions  . Penicillins Nausea Only    REACTION: nausea    Medications: No prescriptions prior to admission     Social History: Social History   Social History  . Marital Status: Married    Spouse Name: N/A  . Number of Children: 4  . Years of Education: N/A   Occupational History  . Disabled    Social History Main Topics  . Smoking status: Former Smoker -- 3.00 packs/day for 40 years    Types: Cigarettes    Quit date: 02/08/1997  . Smokeless tobacco: Former Systems developer     Comment: 02/09/2013 "quit chewing many years ago"  . Alcohol Use: No  . Drug Use: No  . Sexual Activity: Not Currently  Other Topics Concern  . Not on file   Social History Narrative    Family History: Family History  Problem Relation Age of Onset  . Cancer Sister   . Multiple sclerosis Sister   . Esophageal cancer Brother   . COPD Sister   . COPD Brother   . Lung cancer Mother     Review of Systems: Genitourinary: urinary frequency, feelings of urinary urgency, nocturia, incontinence, difficulty starting the urinary stream, weak urinary stream, urinary stream starts and stops, erectile dysfunction and initiating urination requires straining.  Constitutional: feeling tired (fatigue) and recent weight loss.  Eyes: blurred vision.  ENT: sinus problems.  Hematologic/Lymphatic: a tendency to easily bruise.  Cardiovascular: chest pain.  Respiratory: shortness of breath.  Endocrine: polydipsia.  Musculoskeletal: back pain and joint pain.  Neurological: dizziness. Marland Kitchen                   Physical Exam: @VITALS2 @ General: No acute distress.  Awake. Head:  Normocephalic.  Atraumatic. ENT:  EOMI.  Mucous membranes moist Neck:  Supple.  No lymphadenopathy. CV:  S1 present. S2 present. Regular rate. Pulmonary: Equal effort bilaterally.  Clear to auscultation bilaterally. Abdomen: Soft.  none tender to palpation. Skin:  Normal turgor.  No visible rash. Extremity: No gross deformity of bilateral upper extremities.  No gross deformity of                             lower extremities. Neurologic: Alert. Appropriate mood.    Studies:  No results for input(s): HGB, WBC, PLT in the last 72 hours.  No results for input(s): NA, K, CL, CO2, BUN, CREATININE, CALCIUM, GFRNONAA, GFRAA in the last 72 hours.  Invalid input(s): MAGNESIUM   No results for input(s): INR, APTT in the last 72 hours.  Invalid input(s): PT   Invalid input(s): ABG    Assessment:  BPH with significant voiding symptomatology  Plan: [facility] TURP

## 2015-07-24 NOTE — Anesthesia Preprocedure Evaluation (Addendum)
Anesthesia Evaluation  Patient identified by MRN, date of birth, ID band Patient awake    Reviewed: Allergy & Precautions, H&P , NPO status , Patient's Chart, lab work & pertinent test results  History of Anesthesia Complications (+) PONV and history of anesthetic complications  Airway Mallampati: II  TM Distance: >3 FB Neck ROM: Full    Dental  (+) Edentulous Upper, Edentulous Lower   Pulmonary shortness of breath and with exertion, asthma , pneumonia, resolved, COPD, former smoker,    Pulmonary exam normal breath sounds clear to auscultation       Cardiovascular Exercise Tolerance: Poor negative cardio ROS Normal cardiovascular exam Rhythm:Regular Rate:Normal  Echo 2015 Left ventricle: The cavity size was normal. Systolic function was normal. The estimated ejection fraction was in the range of 55% to 60%. Wall motion was normal; there wereno regional wall motion abnormalities. There was an increased relative contribution of atrial contraction to ventricular filling.  NM Stress 2015 Overall low risk Dobutamine Cardiolite. No diagnostic ST segment changes were noted, and there were no arrhythmias. Perfusion imaging is most consistent with soft tissue/diaphragmatic attenuation with fixed inferior defect. No definite ischemic defects are noted. LVEF 64% with normal LV volumes and no wall motion abnormalities.   Neuro/Psych  Headaches, Spinal cord stimulator implant negative psych ROS   GI/Hepatic hiatal hernia, (+)     substance abuse  , Hepatitis -H/O nausea/ vomiting  w/ anesthesia   Endo/Other  Hypothyroidism   Renal/GU negative Renal ROS  negative genitourinary   Musculoskeletal  (+) Arthritis , Rheumatoid disorders,  narcotic dependent  Abdominal   Peds  Hematology negative hematology ROS (+)   Anesthesia Other Findings Patient states spinal cord stimulator was turned off last pm and is inactive at this  time.  Reproductive/Obstetrics negative OB ROS                        Anesthesia Physical  Anesthesia Plan  ASA: III  Anesthesia Plan: General   Post-op Pain Management:    Induction: Intravenous  Airway Management Planned: LMA  Additional Equipment:   Intra-op Plan:   Post-operative Plan: Extubation in OR  Informed Consent: I have reviewed the patients History and Physical, chart, labs and discussed the procedure including the risks, benefits and alternatives for the proposed anesthesia with the patient or authorized representative who has indicated his/her understanding and acceptance.   Dental advisory given  Plan Discussed with: CRNA  Anesthesia Plan Comments:        Anesthesia Quick Evaluation

## 2015-07-24 NOTE — Transfer of Care (Signed)
Immediate Anesthesia Transfer of Care Note  Patient: Andrew [name]  Procedure(s) Performed: Procedure(s): TRANSURETHRAL RESECTION OF THE PROSTATE WITH GYRUS INSTRUMENTS (N/A)  Patient Location: PACU  Anesthesia Type:General  Level of Consciousness: awake, alert , oriented and patient cooperative  Airway & Oxygen Therapy: Patient Spontanous Breathing and Patient connected to nasal cannula oxygen  Post-op Assessment: Report given to RN and Post -op Vital signs reviewed and stable  Post vital signs: Reviewed and stable  Last Vitals:  Filed Vitals:   07/24/15 1216  BP: 150/78  Pulse: 71  Temp: 36.8 C  Resp: 16    Last Pain:  Filed Vitals:   07/24/15 1308  PainSc: 5          Complications: No apparent anesthesia complications

## 2015-07-24 NOTE — Op Note (Signed)
Preoperative diagnosis: 1. Bladder outlet obstruction secondary to BPH  Postoperative diagnosis:  1. Bladder outlet obstruction secondary to BPH  Procedure:  1. Cystoscopy 2. Transurethral resection of the prostate  Surgeon: Lillette Boxer. Lajuan Kovaleski, M.D.  Anesthesia: General  Complications: None  Drain: Foley catheter  EBL: Minimal  Specimens: 1. Prostate chips, to path  Disposition of specimens: Pathology  Indication: Andrew [name] is a patient with bladder outlet obstruction secondary to benign prostatic hyperplasia. After reviewing the management options for treatment, Andrew [name] elected to proceed with the above surgical procedure(s). We have discussed the potential benefits and risks of the procedure, side effects of the proposed treatment, the likelihood of the patient achieving the goals of the procedure, and any potential problems that might occur during the procedure or recuperation. Informed consent has been obtained.  Description of procedure:  The patient was identified in the holding area.Andrew [name] received preoperative antibiotics. Andrew [name] was then taken to the operating room. General anesthetic was administered.  The patient was then placed in the dorsal lithotomy position, prepped and draped in the usual sterile fashion. Timeout was then performed.  A resectoscope sheath was placed using the obturator, and the resectoscope, loop and telescope were placed.  The bladder was then systematically examined in its entirety. There was no evidence of  tumors, stones, or other mucosal pathology.  The ureteral orifices were identified and marked so as to be avoided during the procedure.  The prostate adenoma was then resected utilizing loop cautery resection with the monopolar/bipolar cutting loop.  The prostate adenoma from the bladder neck back to the verumontanum was resected beginning at the six o'clock position and then extended to include the right and left lobes of the prostate and  anterior prostate, respectively. Care was taken not to resect distal to the verumontanum.  Hemostasis was then achieved with the cautery and the bladder was emptied and reinspected with no significant bleeding noted at the end of the procedure.  Resected chips were irrigated from the bladder with the evacuator and sent to pathology.  A 3 way catheter was then placed into the bladder and placed on continuous bladder irrigation.  The patient appeared to tolerate the procedure well and without complications. The patient was able to be awakened and transferred to the recovery unit in satisfactory condition. Andrew [name] tolerated the procedure well.

## 2015-07-24 NOTE — Anesthesia Postprocedure Evaluation (Signed)
Anesthesia Post Note  Patient: Andrew [name]  Procedure(s) Performed: Procedure(s) (LRB): TRANSURETHRAL RESECTION OF THE PROSTATE WITH GYRUS INSTRUMENTS (N/A)  Patient location during evaluation: PACU Anesthesia Type: General Level of consciousness: awake and alert Pain management: pain level controlled Vital Signs Assessment: post-procedure vital signs reviewed and stable Respiratory status: spontaneous breathing, nonlabored ventilation, respiratory function stable and patient connected to nasal cannula oxygen Cardiovascular status: blood pressure returned to baseline and stable Postop Assessment: no signs of nausea or vomiting Anesthetic complications: no    Last Vitals:  Filed Vitals:   07/24/15 1717 07/24/15 1745  BP:  140/79  Pulse: 61 61  Temp:  36.8 C  Resp: 10 12    Last Pain:  Filed Vitals:   07/24/15 1814  PainSc: 5                  Paddy Neis L

## 2015-07-24 NOTE — Anesthesia Procedure Notes (Signed)
Procedure Name: Intubation Date/Time: 07/24/2015 2:50 PM Performed by: Wanita Chamberlain Pre-anesthesia Checklist: Patient identified, Timeout performed, Emergency Drugs available, Suction available and Patient being monitored Patient Re-evaluated:Patient Re-evaluated prior to inductionOxygen Delivery Method: Circle system utilized Preoxygenation: Pre-oxygenation with 100% oxygen Intubation Type: IV induction Ventilation: Mask ventilation without difficulty Laryngoscope Size: Mac and 3 Grade View: Grade I Tube type: Oral Number of attempts: 1 Airway Equipment and Method: Stylet Placement Confirmation: positive ETCO2,  ETT inserted through vocal cords under direct vision and breath sounds checked- equal and bilateral Secured at: 22 cm Tube secured with: Tape Dental Injury: Teeth and Oropharynx as per pre-operative assessment

## 2015-07-25 ENCOUNTER — Encounter (HOSPITAL_BASED_OUTPATIENT_CLINIC_OR_DEPARTMENT_OTHER): Payer: Self-pay | Admitting: Urology

## 2015-07-25 DIAGNOSIS — N138 Other obstructive and reflux uropathy: Secondary | ICD-10-CM | POA: Diagnosis not present

## 2015-07-25 DIAGNOSIS — N3941 Urge incontinence: Secondary | ICD-10-CM | POA: Diagnosis not present

## 2015-07-25 DIAGNOSIS — Z96652 Presence of left artificial knee joint: Secondary | ICD-10-CM | POA: Diagnosis not present

## 2015-07-25 DIAGNOSIS — R3916 Straining to void: Secondary | ICD-10-CM | POA: Diagnosis not present

## 2015-07-25 DIAGNOSIS — R351 Nocturia: Secondary | ICD-10-CM | POA: Diagnosis not present

## 2015-07-25 DIAGNOSIS — M1990 Unspecified osteoarthritis, unspecified site: Secondary | ICD-10-CM | POA: Diagnosis not present

## 2015-07-25 DIAGNOSIS — Z87891 Personal history of nicotine dependence: Secondary | ICD-10-CM | POA: Diagnosis not present

## 2015-07-25 DIAGNOSIS — R3912 Poor urinary stream: Secondary | ICD-10-CM | POA: Diagnosis not present

## 2015-07-25 DIAGNOSIS — Z96649 Presence of unspecified artificial hip joint: Secondary | ICD-10-CM | POA: Diagnosis not present

## 2015-07-25 DIAGNOSIS — N401 Enlarged prostate with lower urinary tract symptoms: Secondary | ICD-10-CM | POA: Diagnosis not present

## 2015-07-25 DIAGNOSIS — J449 Chronic obstructive pulmonary disease, unspecified: Secondary | ICD-10-CM | POA: Diagnosis not present

## 2015-07-25 NOTE — Progress Notes (Signed)
Pt complains of pressure around catheter, this RN visualizes little output coming from catheter. This RN hand irrigated catheter per order, pushed 50cc in, got 150cc in return with 1 small clot. Catheter connected back to collection bag with return of red urine.

## 2015-07-25 NOTE — Progress Notes (Signed)
String of bottles completed.  Urine appeared lighter each void.  Went over all discharge information with patient and family.  All questions answered.  VSS.  Pt requested to walk out with wife.

## 2015-07-25 NOTE — Discharge Summary (Signed)
Patient ID: Andrew [name] MRN: [id number] DOB/AGE: 1950/08/12 65 y.o.  Admit date: 07/24/2015 Discharge date: 07/25/2015  Primary Care Physician:  Manon Hilding, MD  Discharge Diagnoses:   Present on Admission:  . Enlarged prostate with urinary obstruction  Consults:  None     Discharge Medications:   Medication List    STOP taking these medications        VESICARE 5 MG tablet  Generic drug:  solifenacin      TAKE these medications        albuterol 108 (90 Base) MCG/ACT inhaler  Commonly known as:  PROVENTIL HFA;VENTOLIN HFA  Inhale 2 puffs into the lungs every 6 (six) hours as needed for wheezing or shortness of breath. Reported on [id number]     folic acid 1 MG tablet  Commonly known as:  FOLVITE  Take 1 mg by mouth daily.     levothyroxine 75 MCG tablet  Commonly known as:  SYNTHROID, LEVOTHROID  Take 75 mcg by mouth daily before breakfast.     methotrexate 2.5 MG tablet  Commonly known as:  RHEUMATREX  Take 25 mg by mouth once a week. 10 pills every Friday     morphine 30 MG 12 hr tablet  Commonly known as:  MS CONTIN  Take 30 mg by mouth every 12 (twelve) hours.     omeprazole 20 MG capsule  Commonly known as:  PRILOSEC  Take 20 mg by mouth daily. Acid reflux     oxyCODONE-acetaminophen 10-325 MG tablet  Commonly known as:  PERCOCET  Take 1 tablet by mouth 3 (three) times daily as needed for pain. Pain     sulfamethoxazole-trimethoprim 800-160 MG tablet  Commonly known as:  BACTRIM DS,SEPTRA DS  Take 1 tablet by mouth 2 (two) times daily.     Tiotropium Bromide-Olodaterol 2.5-2.5 MCG/ACT Aers  Commonly known as:  STIOLTO RESPIMAT  Inhale 2 puffs into the lungs daily.     zolpidem 10 MG tablet  Commonly known as:  AMBIEN  Take 10 mg by mouth at bedtime.         Significant Diagnostic Studies:  No results found.  Brief H and P: For complete details please refer to admission H and P, but in brief The patient was admitted for symptomatically  BPH, for TURP.  Hospital Course:  Active Problems:   Enlarged prostate with urinary obstruction Postoperative day #1, the patient was doing well. He has some small clots through his catheter late at night. His urine was fairly clear at the time of his check. I discontinued the Foley and he voided adequately  Day of Discharge BP 104/63 mmHg  Pulse 72  Temp(Src) 99.1 F (37.3 C) (Oral)  Resp 16  Ht 5\' 10"  (1.778 m)  Wt 80.74 kg (178 lb)  BMI 25.54 kg/m2  SpO2 96%  Results for orders placed or performed during the hospital encounter of 07/24/15 (from the past 24 hour(s))  I-STAT 4, (NA,K, GLUC, HGB,HCT)     Status: None   Collection Time: 07/24/15 12:58 PM  Result Value Ref Range   Sodium 140 135 - 145 mmol/L   Potassium 4.1 3.5 - 5.1 mmol/L   Glucose, Bld 92 65 - 99 mg/dL   HCT 42.0 39.0 - 52.0 %   Hemoglobin 14.3 13.0 - 17.0 g/dL    Physical Exam: General: Alert and awake oriented x3 not in any acute distress. HEENT: anicteric sclera, pupils reactive to light and accommodation CVS: S1-S2 clear no murmur rubs  or gallops Chest: clear to auscultation bilaterally, no wheezing rales or rhonchi Abdomen: soft nontender, nondistended, normal bowel sounds, no organomegaly Extremities: no cyanosis, clubbing or edema noted bilaterally Neuro: Cranial nerves II-XII intact, no focal neurological deficits  Disposition:  Home  Diet:  No restrictions  Activity:  Gradually increase   Disposition and Follow-up:     Discharge Instructions    Discharge patient    Complete by:  As directed   Once he voids x 2-3 ssuccessfully            He will follow-up as scheduled in our [place] office  TESTS THAT NEED FOLLOW-UP  Pathology review  DISCHARGE FOLLOW-UP Follow-up Information    Follow up with Jorja Loa, MD.   Specialty:  Urology   Why:  as scheduled   Contact information:   [place] [address] [phone]       Time spent on Discharge:   15 minutes  Signed: Jorja Loa 07/25/2015, 7:47 AM

## 2015-08-06 DIAGNOSIS — L237 Allergic contact dermatitis due to plants, except food: Secondary | ICD-10-CM | POA: Diagnosis not present

## 2015-08-06 DIAGNOSIS — Z6825 Body mass index (BMI) 25.0-25.9, adult: Secondary | ICD-10-CM | POA: Diagnosis not present

## 2015-08-09 DIAGNOSIS — Z6825 Body mass index (BMI) 25.0-25.9, adult: Secondary | ICD-10-CM | POA: Diagnosis not present

## 2015-08-09 DIAGNOSIS — L237 Allergic contact dermatitis due to plants, except food: Secondary | ICD-10-CM | POA: Diagnosis not present

## 2015-08-16 DIAGNOSIS — M5442 Lumbago with sciatica, left side: Secondary | ICD-10-CM | POA: Diagnosis not present

## 2015-08-16 DIAGNOSIS — G8929 Other chronic pain: Secondary | ICD-10-CM | POA: Diagnosis not present

## 2015-08-16 DIAGNOSIS — G894 Chronic pain syndrome: Secondary | ICD-10-CM | POA: Diagnosis not present

## 2015-08-16 DIAGNOSIS — M059 Rheumatoid arthritis with rheumatoid factor, unspecified: Secondary | ICD-10-CM | POA: Diagnosis not present

## 2015-08-21 DIAGNOSIS — Z79899 Other long term (current) drug therapy: Secondary | ICD-10-CM | POA: Diagnosis not present

## 2015-08-21 DIAGNOSIS — M0579 Rheumatoid arthritis with rheumatoid factor of multiple sites without organ or systems involvement: Secondary | ICD-10-CM | POA: Diagnosis not present

## 2015-09-03 DIAGNOSIS — R3915 Urgency of urination: Secondary | ICD-10-CM | POA: Diagnosis not present

## 2015-09-18 DIAGNOSIS — M0579 Rheumatoid arthritis with rheumatoid factor of multiple sites without organ or systems involvement: Secondary | ICD-10-CM | POA: Diagnosis not present

## 2015-10-17 DIAGNOSIS — M0579 Rheumatoid arthritis with rheumatoid factor of multiple sites without organ or systems involvement: Secondary | ICD-10-CM | POA: Diagnosis not present

## 2015-10-17 DIAGNOSIS — Z79899 Other long term (current) drug therapy: Secondary | ICD-10-CM | POA: Diagnosis not present

## 2015-11-03 DIAGNOSIS — Z79899 Other long term (current) drug therapy: Secondary | ICD-10-CM | POA: Diagnosis not present

## 2015-11-03 DIAGNOSIS — M150 Primary generalized (osteo)arthritis: Secondary | ICD-10-CM | POA: Diagnosis not present

## 2015-11-03 DIAGNOSIS — G894 Chronic pain syndrome: Secondary | ICD-10-CM | POA: Diagnosis not present

## 2015-11-03 DIAGNOSIS — M7989 Other specified soft tissue disorders: Secondary | ICD-10-CM | POA: Diagnosis not present

## 2015-11-03 DIAGNOSIS — M0579 Rheumatoid arthritis with rheumatoid factor of multiple sites without organ or systems involvement: Secondary | ICD-10-CM | POA: Diagnosis not present

## 2015-11-06 ENCOUNTER — Ambulatory Visit (INDEPENDENT_AMBULATORY_CARE_PROVIDER_SITE_OTHER): Payer: BLUE CROSS/BLUE SHIELD | Admitting: Emergency Medicine

## 2015-11-06 ENCOUNTER — Encounter: Payer: Self-pay | Admitting: Emergency Medicine

## 2015-11-06 DIAGNOSIS — Z23 Encounter for immunization: Secondary | ICD-10-CM

## 2015-11-06 DIAGNOSIS — J449 Chronic obstructive pulmonary disease, unspecified: Secondary | ICD-10-CM | POA: Diagnosis not present

## 2015-11-06 MED ORDER — ALBUTEROL SULFATE HFA 108 (90 BASE) MCG/ACT IN AERS: 2.0000 | INHALATION_SPRAY | Freq: Four times a day (QID) | RESPIRATORY_TRACT | 5 refills | Status: DC | PRN

## 2015-11-06 NOTE — Assessment & Plan Note (Signed)
Chest x-ray annually given his rheumatoid arthritis and is methotrexate use. Need to find out which monthly infusion he is using. Will obtain the notes from Dr. Trudie Reed

## 2015-11-06 NOTE — Progress Notes (Signed)
History of Present Illness:  65 yo former smoker, hx of RA on chronic pred and MTX (started 2011). Childhood asthma that he grew out of. He has had trouble as an adult when he gets a URI. Has been freq treated with He has exertional dyspnea that is worse over about 2 months. Hears wheezing with exertion. As part eval had CXR, not available at this time. He tell me that the film was abnormal, not sure what it showed. Has seen Dr Alva Garnet before, has been on Advair, albuterol, maybe others.   ROV 01/09/10 -- dyspnea, hx tobacco, RA/MTX. Has been on Spiriva and Advair before per old chart in 05, 06. PFT today show moderate AFL, borderline BD response, no restriction. Normal DLCO. Has siblings w COPD.   ROV 09/17/11 -- 22 yo man, former tobacco, hx RA on MTX and humira. Last time 2011 we showed that he has moderate AFL on spiro. He tells me that he is having progressive SOB over many months time. We had planned to restart BD's last time. He isn't using anything right now. He didn't tolerate Spiriva or Advair - felt bad when he used them. He describes throat and esophageal pain that happens acutely and then resolves - ? Nutcracker esophagus.   ROV 10/18/11 -- 65 yo man, former tobacco, hx RA on MTX and humira.  Last visit we started Symbicort + SABA prn (had AFL on spiro 2011). No evidence ILD on CXR last time (for RA and MTX). He isn't sure that the Symbicort has helped him. He hasn't uses the albuterol   ROV 04/27/12 --  65 yo man, former tobacco and COPD (moderate AFL on spiro), hx RA on MTX and humira. Returns for f/u. He stopped symbicort after no benefit on therapeutic trial.  He tells me that his breathing has been stable. Still gets out of breath with heavy lifting, moving too quickly. He has SABA but never uses.   ROV 02/28/13 -- COPD and RA on MTX and remicade. He was admitted for a RUL CAP on 02/09/13 at [facility]. The dx was made by CT scan chest 1/3, with evidence for some bronchiectatic change and GGI, also  showed a RUL cavity vs bleb. Needs a repeat in 2 mon. He is feeling better but not back to full strength. He is having exertional dyspnea.  His only inhaled med right now is ProAir.   ROV 03/29/13 -- hx COPD, RA on MTX, plaquanil and remicade. CT chest 02/10/13 with RUL cavity/bleb, bronchiectasis, GGI. He returns today to review repeat CT done 03/26/13 > full resolution, 2 stable nodules < 43mm. He still doesn't feel full speed yet. His RA and joints are flaring. He uses ProAir rarely. He has not really benefited from Spiriva, Symbicort, Advair in the past.   ROV 05/01/13 -- hx COPD, RA on MTX, plaquanil and remicade. Following pulm nodules by CT scans last in 03/2013. He is just getting over a GI bug > feeling some better. We did a trial of brovana to see if he would benefit > no improvement in his breathing. No other changes.   ROV 10/29/13 -- hx COPD, RA on MTX, plaquanil and remicade. Following pulm nodules, next scan due 03/2014.  He had some increased SOB through the summer, but has overall been stable. He was seen at [facility] for HA and dx with a sinusitis. He is improved now. He has not needed SABA.  ROV 03/28/14 -- follow-up visit for COPD and rheumatoid arthritis on methotrexate, Plaquenil and Remicade. We have been following pulmonary nodules by CT scan, documented stable now over 2 years. He has 120 pk-yrs tobacco and would qualify for LDCT screening. He is having a lot of joint pain - he isn't sure that remicade has helped him. He is talking to Dr Trudie Reed about making a change. He rarely uses albuterol, feels that his exercise tolerance is good.   ROV 09/24/14 -- Follow-up visit for COPD and a significant  tobacco history, rheumatoid arthritis on methotrexate, Plaquenil. Was changed off remicade to another biologic, but currently he is not receiving because Dr Trudie Reed has changed location. We have followed pulmonary nodules on his CT scan of the chest that were documented stable 2 years on his last scan 03/2014.  He is still having poly-joint pain on the current meds. Not currently on scheduled BD. He has had exertional SOB, fatigue. Rarely uses albuterol. He has been on long-acting meds before but they haven't seemed to help him.   ROV 03/31/15 -- Patient with a history of COPD, rheumatoid arthritis on methotrexate and Plaquenil. Also been treated in the past with Remicade. He has a history of pulmonary nodules that were followed by CT scan of the chest and were stable over 2 years. Last CT was in 9/16 He is interested in getting back on another biologic with Dr Trudie Reed. He believes that he has had more DOE, especially when carrying things. Has also noticed some dyspnea at rest. His FEV1 in 2011 was 61% predicted.  He is on some new medication for bladder control per urology - can't recall the name.   ROV 04/22/15 -- follow-up visit for history of COPD. He also has rheumatoid arthritis since been treated with methotrexate. His also been on Remicade in the past. At our last visit I asked him to start Stiolto to see if he would benefit.  He is not sure whether it is doing much for him. He still gets SOB with walking the dog and with heavy lifting.  A chest x-ray performed at his last visit showed some mild evidence for interstitial thickening in the mid and lower lungs without any significant interval change compared with January 2015.  He needs a new SABA script.   ROV 11/06/15 -- patient has a history of COPD with moderate obstruction on his prior pulmonary function testing. He also has rheumatoid arthritis for which he has been on methotrexate. Have done a trial of Stiolto. At our last visit he was on sure as to  whether it helped him very much. He has taken the stioloto prn since then  Has been dealing with nausea for last several weeks, happens after eating. No URI sx, ? Whether he may have had a fever. He had vesicare and meloxicam added. He started getting an immunotherapy infusion about a year ago (not humira, not remicade, ? The name, managed by Dr Trudie Reed). Having a lot of nasal drainage and congestion.    Vitals:   11/06/15 1046 11/06/15 1047  BP:  136/78  BP Location:  Right Arm  Cuff Size:  Normal  Pulse:  65  SpO2:  96%  Weight: 177 lb (80.3 kg)   Height: 5\' 10"  (1.778 m)    Gen: Pleasant, well-nourished, in no distress,  normal affect  ENT: No lesions,  mouth clear,  oropharynx clear, no postnasal drip  Neck: No JVD, no TMG, no carotid bruits  Lungs: No use  of accessory muscles, no wheeze on normal breath, mild exp wheeze on forced exp  Cardiovascular: RRR, heart sounds normal, no murmur or gallops, no peripheral edema  Musculoskeletal: No deformities, no cyanosis or clubbing  Neuro: alert, non focal  Skin: Warm, no lesions or rashes    10/2014 --  COMPARISON: 03/26/2014  FINDINGS: Mediastinum/Nodes: Tortuous thoracic aorta. Aortic and branch vessel atherosclerosis. Normal heart size, without pericardial effusion. No mediastinal or definite hilar adenopathy, given limitations of unenhanced CT.  Lungs/Pleura: No pleural fluid. Secretions within the trachea and left mainstem bronchus. Lower lobe predominant bronchial wall thickening. Severe centrilobular emphysema.  Subpleural right middle lobe linear 4 millimeter density in image 46 of series 3 is unchanged.  A subpleural 3-4 millimeter right lower lobe nodule on image 47 is similar.  Left lower lobe 4 millimeter nodule on image 39 is unchanged.  Upper abdomen: Normal imaged portions of the liver, spleen, stomach, pancreas, adrenal glands, left kidney.  Musculoskeletal: Dorsal spinal stimulator.  Thoracic spondylosis.  IMPRESSION: 1. Advanced centrilobular emphysema with similar bilateral pulmonary nodules. 2. No acute process in the chest   COPD (chronic obstructive pulmonary disease) He is unsure that this Stiolto helped him. He started using it when necessary. We will stop it for now. Continue his albuterol when necessary  Rheumatoid arthritis ([place]) Chest x-ray annually given his rheumatoid arthritis and is methotrexate use. Need to find out which monthly infusion he is using. Will obtain the notes from Dr. Recardo Evangelist, MD, PhD 11/06/2015, 11:04 AM [facility] [phone] or if no answer [phone]

## 2015-11-06 NOTE — Patient Instructions (Addendum)
We will stop Stiolto Please use albuterol 2 puffs up to every 4 hours if needed for shortness of breath.  Try starting loratadine 10mg  daily Flu shot today Follow with Dr Lamonte Sakai in 6 months or sooner if you have any problems

## 2015-11-06 NOTE — Assessment & Plan Note (Signed)
He is unsure that this Stiolto helped him. He started using it when necessary. We will stop it for now. Continue his albuterol when necessary

## 2015-11-14 DIAGNOSIS — M0579 Rheumatoid arthritis with rheumatoid factor of multiple sites without organ or systems involvement: Secondary | ICD-10-CM | POA: Diagnosis not present

## 2015-11-14 DIAGNOSIS — Z79899 Other long term (current) drug therapy: Secondary | ICD-10-CM | POA: Diagnosis not present

## 2015-12-02 DIAGNOSIS — J309 Allergic rhinitis, unspecified: Secondary | ICD-10-CM | POA: Diagnosis not present

## 2015-12-02 DIAGNOSIS — J069 Acute upper respiratory infection, unspecified: Secondary | ICD-10-CM | POA: Diagnosis not present

## 2015-12-02 DIAGNOSIS — Z6825 Body mass index (BMI) 25.0-25.9, adult: Secondary | ICD-10-CM | POA: Diagnosis not present

## 2015-12-16 DIAGNOSIS — M0579 Rheumatoid arthritis with rheumatoid factor of multiple sites without organ or systems involvement: Secondary | ICD-10-CM | POA: Diagnosis not present

## 2016-01-10 DIAGNOSIS — S299XXA Unspecified injury of thorax, initial encounter: Secondary | ICD-10-CM | POA: Diagnosis not present

## 2016-01-10 DIAGNOSIS — R0789 Other chest pain: Secondary | ICD-10-CM | POA: Diagnosis not present

## 2016-01-10 DIAGNOSIS — M542 Cervicalgia: Secondary | ICD-10-CM | POA: Diagnosis not present

## 2016-01-10 DIAGNOSIS — R0781 Pleurodynia: Secondary | ICD-10-CM | POA: Diagnosis not present

## 2016-01-10 DIAGNOSIS — M62838 Other muscle spasm: Secondary | ICD-10-CM | POA: Diagnosis not present

## 2016-01-10 DIAGNOSIS — W19XXXA Unspecified fall, initial encounter: Secondary | ICD-10-CM | POA: Diagnosis not present

## 2016-01-10 DIAGNOSIS — S199XXA Unspecified injury of neck, initial encounter: Secondary | ICD-10-CM | POA: Diagnosis not present

## 2016-01-13 DIAGNOSIS — Z79899 Other long term (current) drug therapy: Secondary | ICD-10-CM | POA: Diagnosis not present

## 2016-01-13 DIAGNOSIS — M0579 Rheumatoid arthritis with rheumatoid factor of multiple sites without organ or systems involvement: Secondary | ICD-10-CM | POA: Diagnosis not present

## 2016-01-20 DIAGNOSIS — M542 Cervicalgia: Secondary | ICD-10-CM | POA: Diagnosis not present

## 2016-02-11 DIAGNOSIS — M0579 Rheumatoid arthritis with rheumatoid factor of multiple sites without organ or systems involvement: Secondary | ICD-10-CM | POA: Diagnosis not present

## 2016-02-13 ENCOUNTER — Other Ambulatory Visit: Payer: Self-pay | Admitting: Chiropractic Medicine

## 2016-02-13 ENCOUNTER — Other Ambulatory Visit: Payer: Self-pay | Admitting: Physical Medicine and Rehabilitation

## 2016-02-13 DIAGNOSIS — M47812 Spondylosis without myelopathy or radiculopathy, cervical region: Secondary | ICD-10-CM | POA: Diagnosis not present

## 2016-02-13 DIAGNOSIS — M542 Cervicalgia: Secondary | ICD-10-CM | POA: Diagnosis not present

## 2016-02-18 ENCOUNTER — Ambulatory Visit
Admission: RE | Admit: 2016-02-18 | Discharge: 2016-02-18 | Disposition: A | Discharge status: Home / Self Care | Payer: BLUE CROSS/BLUE SHIELD | Source: Ambulatory Visit | Attending: Physical Medicine and Rehabilitation | Admitting: Physical Medicine and Rehabilitation

## 2016-02-18 DIAGNOSIS — M47812 Spondylosis without myelopathy or radiculopathy, cervical region: Secondary | ICD-10-CM

## 2016-02-18 DIAGNOSIS — M542 Cervicalgia: Secondary | ICD-10-CM | POA: Diagnosis not present

## 2016-02-20 DIAGNOSIS — G894 Chronic pain syndrome: Secondary | ICD-10-CM | POA: Diagnosis not present

## 2016-02-20 DIAGNOSIS — S12041A Nondisplaced lateral mass fracture of first cervical vertebra, initial encounter for closed fracture: Secondary | ICD-10-CM | POA: Diagnosis not present

## 2016-02-20 DIAGNOSIS — M47812 Spondylosis without myelopathy or radiculopathy, cervical region: Secondary | ICD-10-CM | POA: Diagnosis not present

## 2016-03-09 DIAGNOSIS — G894 Chronic pain syndrome: Secondary | ICD-10-CM | POA: Diagnosis not present

## 2016-03-09 DIAGNOSIS — Z79899 Other long term (current) drug therapy: Secondary | ICD-10-CM | POA: Diagnosis not present

## 2016-03-09 DIAGNOSIS — M150 Primary generalized (osteo)arthritis: Secondary | ICD-10-CM | POA: Diagnosis not present

## 2016-03-09 DIAGNOSIS — Z6826 Body mass index (BMI) 26.0-26.9, adult: Secondary | ICD-10-CM | POA: Diagnosis not present

## 2016-03-09 DIAGNOSIS — E663 Overweight: Secondary | ICD-10-CM | POA: Diagnosis not present

## 2016-03-09 DIAGNOSIS — M0579 Rheumatoid arthritis with rheumatoid factor of multiple sites without organ or systems involvement: Secondary | ICD-10-CM | POA: Diagnosis not present

## 2016-03-11 DIAGNOSIS — M0579 Rheumatoid arthritis with rheumatoid factor of multiple sites without organ or systems involvement: Secondary | ICD-10-CM | POA: Diagnosis not present

## 2016-03-19 DIAGNOSIS — S12041D Nondisplaced lateral mass fracture of first cervical vertebra, subsequent encounter for fracture with routine healing: Secondary | ICD-10-CM | POA: Diagnosis not present

## 2016-03-27 DIAGNOSIS — A491 Streptococcal infection, unspecified site: Secondary | ICD-10-CM | POA: Diagnosis not present

## 2016-03-27 DIAGNOSIS — Z6825 Body mass index (BMI) 25.0-25.9, adult: Secondary | ICD-10-CM | POA: Diagnosis not present

## 2016-03-27 DIAGNOSIS — R040 Epistaxis: Secondary | ICD-10-CM | POA: Diagnosis not present

## 2016-04-08 DIAGNOSIS — Z79899 Other long term (current) drug therapy: Secondary | ICD-10-CM | POA: Diagnosis not present

## 2016-04-08 DIAGNOSIS — M0579 Rheumatoid arthritis with rheumatoid factor of multiple sites without organ or systems involvement: Secondary | ICD-10-CM | POA: Diagnosis not present

## 2016-04-19 DIAGNOSIS — S12041D Nondisplaced lateral mass fracture of first cervical vertebra, subsequent encounter for fracture with routine healing: Secondary | ICD-10-CM | POA: Diagnosis not present

## 2016-04-20 ENCOUNTER — Other Ambulatory Visit: Payer: Self-pay | Admitting: Orthopedic Surgery

## 2016-04-20 DIAGNOSIS — S12041D Nondisplaced lateral mass fracture of first cervical vertebra, subsequent encounter for fracture with routine healing: Secondary | ICD-10-CM

## 2016-04-20 DIAGNOSIS — G894 Chronic pain syndrome: Secondary | ICD-10-CM | POA: Diagnosis not present

## 2016-04-22 ENCOUNTER — Ambulatory Visit
Admission: RE | Admit: 2016-04-22 | Discharge: 2016-04-22 | Disposition: A | Discharge status: Home / Self Care | Payer: BLUE CROSS/BLUE SHIELD | Source: Ambulatory Visit | Attending: Orthopedic Surgery | Admitting: Orthopedic Surgery

## 2016-04-22 DIAGNOSIS — S12040A Displaced lateral mass fracture of first cervical vertebra, initial encounter for closed fracture: Secondary | ICD-10-CM | POA: Diagnosis not present

## 2016-04-22 DIAGNOSIS — S12041D Nondisplaced lateral mass fracture of first cervical vertebra, subsequent encounter for fracture with routine healing: Secondary | ICD-10-CM

## 2016-04-26 DIAGNOSIS — R351 Nocturia: Secondary | ICD-10-CM | POA: Diagnosis not present

## 2016-04-26 DIAGNOSIS — N401 Enlarged prostate with lower urinary tract symptoms: Secondary | ICD-10-CM | POA: Diagnosis not present

## 2016-05-06 ENCOUNTER — Ambulatory Visit (INDEPENDENT_AMBULATORY_CARE_PROVIDER_SITE_OTHER): Payer: BLUE CROSS/BLUE SHIELD | Admitting: Emergency Medicine

## 2016-05-06 ENCOUNTER — Ambulatory Visit (INDEPENDENT_AMBULATORY_CARE_PROVIDER_SITE_OTHER)
Admission: RE | Admit: 2016-05-06 | Discharge: 2016-05-06 | Disposition: A | Discharge status: Home / Self Care | Payer: BLUE CROSS/BLUE SHIELD | Source: Ambulatory Visit | Attending: Emergency Medicine | Admitting: Emergency Medicine

## 2016-05-06 ENCOUNTER — Encounter: Payer: Self-pay | Admitting: Emergency Medicine

## 2016-05-06 DIAGNOSIS — R911 Solitary pulmonary nodule: Secondary | ICD-10-CM

## 2016-05-06 DIAGNOSIS — J449 Chronic obstructive pulmonary disease, unspecified: Secondary | ICD-10-CM

## 2016-05-06 DIAGNOSIS — M069 Rheumatoid arthritis, unspecified: Secondary | ICD-10-CM | POA: Diagnosis not present

## 2016-05-06 MED ORDER — TIOTROPIUM BROMIDE-OLODATEROL 2.5-2.5 MCG/ACT IN AERS: 2.0000 | INHALATION_SPRAY | Freq: Every day | RESPIRATORY_TRACT | 5 refills | Status: DC

## 2016-05-06 NOTE — Assessment & Plan Note (Signed)
Have been stable on serial CT's

## 2016-05-06 NOTE — Addendum Note (Signed)
Addended by: Jannette Spanner on: 05/06/2016 11:11 AM   Modules accepted: Orders

## 2016-05-06 NOTE — Assessment & Plan Note (Signed)
Currently untreated and with exertional SOB. He has not wanted to use daily BD. I have discussed with him another trial of Stiolto qd. Also need to insure that he has albuterol to use prn. Walking oximetry today.

## 2016-05-06 NOTE — Patient Instructions (Signed)
We will perform a walking oximetry today We will restart Stiolto 2 inhalations once a day.  Take albuterol 2 puffs up to every 4 hours if needed for shortness of breath.  We will get the name of your rheumatoid medication from Dr Trudie Reed.  CXR today since you are on methotrexate.  Follow with Dr Lamonte Sakai in 4 months or sooner if you have any problems.

## 2016-05-06 NOTE — Progress Notes (Signed)
History of Present Illness:  66 yo former smoker, hx of RA on chronic pred and MTX (started 2011). Childhood asthma that he grew out of. He has had trouble as an adult when he gets a URI. Has been freq treated with He has exertional dyspnea that is worse over about 2 months. Hears wheezing with exertion. As part eval had CXR, not available at this time. He tell me that the film was abnormal, not sure what it showed. Has seen Dr Alva Garnet before, has been on Advair, albuterol, maybe others.    ROV 11/06/15 -- patient has a history of COPD with moderate obstruction on his prior pulmonary function testing. He also has rheumatoid arthritis for which he has been on methotrexate. Have done a trial of Stiolto. At our last visit he was on sure as to whether it helped him very much. He has taken the stioloto prn since then  Has been dealing with nausea for last several weeks, happens after eating. No URI sx, ? Whether he may have had a fever. He had vesicare and meloxicam added. He started getting an immunotherapy infusion about a year ago (not humira, not remicade, ? The name, managed by Dr Trudie Reed). Having a lot of nasal drainage and congestion.   ROV 05/06/16 -- this follow-up visit for patient with a history of COPD, RA on MTX. Have followed him for nodules tha did not change om serial scans. We have treated him in the past w Stiolto. He decide to stop this about 9 months ago. He has some exertional SOB, feels dizzy when he goes from sitting to standing, sometimes when walking or exerting carrying groceries. He still gets a biologic for RA, he cannot remember the name. Little cough or mucous.   Vitals:   05/06/16 1032  BP: 124/80  Pulse: 68  SpO2: 97%  Weight: 182 lb 9.6 oz (82.8 kg)  Height: 5\' 10"  (1.778 m)   Gen: Pleasant, well-nourished, in no distress,  normal affect  ENT: No lesions,  mouth clear,  oropharynx clear, no postnasal drip  Neck: No JVD, no TMG, no carotid bruits  Lungs: No use of  accessory muscles, no wheezing. He does have soft wheeze on a forced exp  Cardiovascular: RRR, heart sounds normal, no murmur or gallops, no peripheral edema  Musculoskeletal: No deformities, no cyanosis or clubbing  Neuro: alert, non focal  Skin: Warm, no lesions or rashes    10/2014 --  COMPARISON: 03/26/2014  FINDINGS: Mediastinum/Nodes: Tortuous thoracic aorta. Aortic and branch vessel atherosclerosis. Normal heart size, without pericardial effusion. No mediastinal or definite hilar adenopathy, given limitations of unenhanced CT.  Lungs/Pleura: No pleural fluid. Secretions within the trachea and left mainstem bronchus. Lower lobe predominant bronchial wall thickening. Severe centrilobular emphysema.  Subpleural right middle lobe linear 4 millimeter density in image 46 of series 3 is unchanged.  A subpleural 3-4 millimeter right lower lobe nodule on image 47 is similar.  Left lower lobe 4 millimeter nodule on image 39 is unchanged.  Upper abdomen: Normal imaged portions of the liver, spleen, stomach, pancreas, adrenal glands, left kidney.  Musculoskeletal: Dorsal spinal stimulator. Thoracic spondylosis.  IMPRESSION: 1. Advanced centrilobular emphysema with similar bilateral pulmonary nodules. 2. No acute process in the chest   COPD (chronic obstructive pulmonary disease) Currently untreated and with exertional SOB. He has not wanted to use daily BD. I have discussed with him another trial of Stiolto qd. Also need to insure that he has albuterol to use prn. Walking  oximetry today.   Pulmonary nodule, right Have been stable on serial CT's  Rheumatoid arthritis ([facility]) On MTX and also on a biologic. Will figure out what the biologic is. Needs a CXR today to assess for interstitial disease.   [name], MD, PhD 05/06/2016, 11:04 AM [facility] [phone] or if no answer [phone]

## 2016-05-06 NOTE — Assessment & Plan Note (Signed)
On MTX and also on a biologic. Will figure out what the biologic is. Needs a CXR today to assess for interstitial disease.

## 2016-05-10 DIAGNOSIS — M542 Cervicalgia: Secondary | ICD-10-CM | POA: Diagnosis not present

## 2016-05-10 DIAGNOSIS — M47812 Spondylosis without myelopathy or radiculopathy, cervical region: Secondary | ICD-10-CM | POA: Diagnosis not present

## 2016-05-13 DIAGNOSIS — M0579 Rheumatoid arthritis with rheumatoid factor of multiple sites without organ or systems involvement: Secondary | ICD-10-CM | POA: Diagnosis not present

## 2016-05-13 DIAGNOSIS — Z79899 Other long term (current) drug therapy: Secondary | ICD-10-CM | POA: Diagnosis not present

## 2016-05-20 DIAGNOSIS — L739 Follicular disorder, unspecified: Secondary | ICD-10-CM | POA: Insufficient documentation

## 2016-05-20 DIAGNOSIS — J0140 Acute pansinusitis, unspecified: Secondary | ICD-10-CM | POA: Insufficient documentation

## 2016-06-07 DIAGNOSIS — Z79899 Other long term (current) drug therapy: Secondary | ICD-10-CM | POA: Diagnosis not present

## 2016-06-07 DIAGNOSIS — M0579 Rheumatoid arthritis with rheumatoid factor of multiple sites without organ or systems involvement: Secondary | ICD-10-CM | POA: Diagnosis not present

## 2016-06-07 DIAGNOSIS — Z6826 Body mass index (BMI) 26.0-26.9, adult: Secondary | ICD-10-CM | POA: Diagnosis not present

## 2016-06-07 DIAGNOSIS — G894 Chronic pain syndrome: Secondary | ICD-10-CM | POA: Diagnosis not present

## 2016-06-07 DIAGNOSIS — M05741 Rheumatoid arthritis with rheumatoid factor of right hand without organ or systems involvement: Secondary | ICD-10-CM | POA: Diagnosis not present

## 2016-06-07 DIAGNOSIS — E663 Overweight: Secondary | ICD-10-CM | POA: Diagnosis not present

## 2016-06-07 DIAGNOSIS — M150 Primary generalized (osteo)arthritis: Secondary | ICD-10-CM | POA: Diagnosis not present

## 2016-06-07 DIAGNOSIS — M05742 Rheumatoid arthritis with rheumatoid factor of left hand without organ or systems involvement: Secondary | ICD-10-CM | POA: Diagnosis not present

## 2016-06-09 DIAGNOSIS — M059 Rheumatoid arthritis with rheumatoid factor, unspecified: Secondary | ICD-10-CM | POA: Diagnosis not present

## 2016-06-09 DIAGNOSIS — J439 Emphysema, unspecified: Secondary | ICD-10-CM | POA: Diagnosis not present

## 2016-06-09 DIAGNOSIS — R5383 Other fatigue: Secondary | ICD-10-CM | POA: Diagnosis not present

## 2016-06-09 DIAGNOSIS — Z6826 Body mass index (BMI) 26.0-26.9, adult: Secondary | ICD-10-CM | POA: Diagnosis not present

## 2016-06-18 DIAGNOSIS — T85193A Other mechanical complication of implanted electronic neurostimulator, generator, initial encounter: Secondary | ICD-10-CM | POA: Diagnosis not present

## 2016-06-18 DIAGNOSIS — G894 Chronic pain syndrome: Secondary | ICD-10-CM | POA: Diagnosis not present

## 2016-06-18 DIAGNOSIS — M961 Postlaminectomy syndrome, not elsewhere classified: Secondary | ICD-10-CM | POA: Diagnosis not present

## 2016-06-18 DIAGNOSIS — T85898A Other specified complication of other internal prosthetic devices, implants and grafts, initial encounter: Secondary | ICD-10-CM | POA: Diagnosis not present

## 2016-06-18 DIAGNOSIS — T85615A Breakdown (mechanical) of other nervous system device, implant or graft, initial encounter: Secondary | ICD-10-CM | POA: Diagnosis not present

## 2016-07-15 DIAGNOSIS — Z79899 Other long term (current) drug therapy: Secondary | ICD-10-CM | POA: Diagnosis not present

## 2016-07-15 DIAGNOSIS — M0579 Rheumatoid arthritis with rheumatoid factor of multiple sites without organ or systems involvement: Secondary | ICD-10-CM | POA: Diagnosis not present

## 2016-07-15 DIAGNOSIS — G894 Chronic pain syndrome: Secondary | ICD-10-CM | POA: Diagnosis not present

## 2016-07-15 DIAGNOSIS — M150 Primary generalized (osteo)arthritis: Secondary | ICD-10-CM | POA: Diagnosis not present

## 2016-07-20 DIAGNOSIS — G894 Chronic pain syndrome: Secondary | ICD-10-CM | POA: Diagnosis not present

## 2016-07-20 DIAGNOSIS — G8929 Other chronic pain: Secondary | ICD-10-CM | POA: Diagnosis not present

## 2016-07-20 DIAGNOSIS — M5442 Lumbago with sciatica, left side: Secondary | ICD-10-CM | POA: Diagnosis not present

## 2016-07-29 DIAGNOSIS — Z79899 Other long term (current) drug therapy: Secondary | ICD-10-CM | POA: Diagnosis not present

## 2016-07-29 DIAGNOSIS — E039 Hypothyroidism, unspecified: Secondary | ICD-10-CM | POA: Diagnosis not present

## 2016-07-29 DIAGNOSIS — M0579 Rheumatoid arthritis with rheumatoid factor of multiple sites without organ or systems involvement: Secondary | ICD-10-CM | POA: Diagnosis not present

## 2016-08-26 DIAGNOSIS — M0579 Rheumatoid arthritis with rheumatoid factor of multiple sites without organ or systems involvement: Secondary | ICD-10-CM | POA: Diagnosis not present

## 2016-08-26 DIAGNOSIS — Z79899 Other long term (current) drug therapy: Secondary | ICD-10-CM | POA: Diagnosis not present

## 2016-08-31 ENCOUNTER — Encounter: Payer: Self-pay | Admitting: Emergency Medicine

## 2016-08-31 ENCOUNTER — Ambulatory Visit (INDEPENDENT_AMBULATORY_CARE_PROVIDER_SITE_OTHER): Payer: BLUE CROSS/BLUE SHIELD | Admitting: Emergency Medicine

## 2016-08-31 DIAGNOSIS — J449 Chronic obstructive pulmonary disease, unspecified: Secondary | ICD-10-CM

## 2016-08-31 DIAGNOSIS — R911 Solitary pulmonary nodule: Secondary | ICD-10-CM

## 2016-08-31 DIAGNOSIS — Z23 Encounter for immunization: Secondary | ICD-10-CM

## 2016-08-31 NOTE — Patient Instructions (Signed)
We will not restart an everyday scheduled inhaled medication at this time Please continue to keep albuterol available to use 2 puffs if needed for shortness of breath, wheezing, chest tightness Prevnar 13 pneumonia shot today Please get your flu shot in the fall Follow with Dr Lamonte Sakai in 12 months or sooner if you have any problems

## 2016-08-31 NOTE — Assessment & Plan Note (Signed)
He is tolerated discontinuation of Stiolto. Not limited at this time. No exacerbations. I believe that we can hold off on adding back scheduled therapy. He continues to use albuterol rarely but has it available.  We will not restart an everyday scheduled inhaled medication at this time Please continue to keep albuterol available to use 2 puffs if needed for shortness of breath, wheezing, chest tightness Prevnar 13 pneumonia shot today Please get your flu shot in the fall Follow with Dr Lamonte Sakai in 12 months or sooner if you have any problems

## 2016-08-31 NOTE — Progress Notes (Signed)
History of Present Illness:  66 yo former smoker, hx of RA on chronic pred and MTX (started 2011). Childhood asthma that he grew out of. He has had trouble as an adult when he gets a URI. Has been freq treated with He has exertional dyspnea that is worse over about 2 months. Hears wheezing with exertion. As part eval had CXR, not available at this time. He tell me that the film was abnormal, not sure what it showed. Has seen Dr Alva Garnet before, has been on Advair, albuterol, maybe others.    ROV 11/06/15 -- patient has a history of COPD with moderate obstruction on his prior pulmonary function testing. He also has rheumatoid arthritis for which he has been on methotrexate. Have done a trial of Stiolto. At our last visit he was on sure as to whether it helped him very much. He has taken the stioloto prn since then  Has been dealing with nausea for last several weeks, happens after eating. No URI sx, ? Whether he may have had a fever. He had vesicare and meloxicam added. He started getting an immunotherapy infusion about a year ago (not humira, not remicade, ? The name, managed by Dr Trudie Reed). Having a lot of nasal drainage and congestion.   ROV 05/06/16 -- this follow-up visit for patient with a history of COPD, RA on MTX. Have followed him for nodules tha did not change om serial scans. We have treated him in the past w Stiolto. He decide to stop this about 9 months ago. He has some exertional SOB, feels dizzy when he goes from sitting to standing, sometimes when walking or exerting carrying groceries. He still gets a biologic for RA, he cannot remember the name. Little cough or mucous.   ROV 08/31/16 -- this follow-up visit for patient with a history of COPD, rheumatoid arthritis being treated with methotrexate. Also with a history of pulmonary nodules that we have followed with serial CT scans of the chest and which were stable. Since last time he stopped Stiolto - he is not clear that he misses it. He has  SABA available - never uses it. He feels that his exercise tolerance is good. No exacerbations. No cough or wheeze. He is on MTX, and also on a monthly biologic (atemera??).     Vitals:   08/31/16 0854 08/31/16 0855  BP:  (!) 148/90  Pulse:  70  SpO2:  97%  Weight: 183 lb (83 kg)   Height: 5\' 10"  (1.778 m)    Gen: Pleasant, well-nourished, in no distress,  normal affect  ENT: No lesions,  mouth clear,  oropharynx clear, no postnasal drip  Neck: No JVD, no TMG, no carotid bruits  Lungs: No use of accessory muscles, no wheezing. He does have soft wheeze on a forced exp  Cardiovascular: RRR, heart sounds normal, no murmur or gallops, no peripheral edema  Musculoskeletal: No deformities, no cyanosis or clubbing  Neuro: alert, non focal  Skin: Warm, no lesions or rashes    10/2014 --  COMPARISON: 03/26/2014  FINDINGS: Mediastinum/Nodes: Tortuous thoracic aorta. Aortic and branch vessel atherosclerosis. Normal heart size, without pericardial effusion. No mediastinal or definite hilar adenopathy, given limitations of unenhanced CT.  Lungs/Pleura: No pleural fluid. Secretions within the trachea and left mainstem bronchus. Lower lobe predominant bronchial wall thickening. Severe centrilobular emphysema.  Subpleural right middle lobe linear 4 millimeter density in image 46 of series 3 is unchanged.  A subpleural 3-4 millimeter right lower lobe nodule on image  47 is similar.  Left lower lobe 4 millimeter nodule on image 39 is unchanged.  Upper abdomen: Normal imaged portions of the liver, spleen, stomach, pancreas, adrenal glands, left kidney.  Musculoskeletal: Dorsal spinal stimulator. Thoracic spondylosis.  IMPRESSION: 1. Advanced centrilobular emphysema with similar bilateral pulmonary nodules. 2. No acute process in the chest   Pulmonary nodule, right Stable on serial CT scans. Likely related to his rheumatoid disease.  COPD (chronic obstructive  pulmonary disease) He is tolerated discontinuation of Stiolto. Not limited at this time. No exacerbations. I believe that we can hold off on adding back scheduled therapy. He continues to use albuterol rarely but has it available.  We will not restart an everyday scheduled inhaled medication at this time Please continue to keep albuterol available to use 2 puffs if needed for shortness of breath, wheezing, chest tightness Prevnar 13 pneumonia shot today Please get your flu shot in the fall Follow with Dr Lamonte Sakai in 12 months or sooner if you have any problems  [name], MD, PhD 08/31/2016, 9:20 AM [facility] [place] [phone] or if no answer [phone]

## 2016-08-31 NOTE — Assessment & Plan Note (Signed)
Stable on serial CT scans. Likely related to his rheumatoid disease.

## 2016-08-31 NOTE — Addendum Note (Signed)
Addended by: Desmond Dike C on: 08/31/2016 09:24 AM   Modules accepted: Orders

## 2016-09-18 DIAGNOSIS — L259 Unspecified contact dermatitis, unspecified cause: Secondary | ICD-10-CM | POA: Diagnosis not present

## 2016-09-23 DIAGNOSIS — M0579 Rheumatoid arthritis with rheumatoid factor of multiple sites without organ or systems involvement: Secondary | ICD-10-CM | POA: Diagnosis not present

## 2016-10-15 DIAGNOSIS — M150 Primary generalized (osteo)arthritis: Secondary | ICD-10-CM | POA: Diagnosis not present

## 2016-10-15 DIAGNOSIS — M0579 Rheumatoid arthritis with rheumatoid factor of multiple sites without organ or systems involvement: Secondary | ICD-10-CM | POA: Diagnosis not present

## 2016-10-15 DIAGNOSIS — G894 Chronic pain syndrome: Secondary | ICD-10-CM | POA: Diagnosis not present

## 2016-10-15 DIAGNOSIS — Z79899 Other long term (current) drug therapy: Secondary | ICD-10-CM | POA: Diagnosis not present

## 2016-10-21 DIAGNOSIS — M0579 Rheumatoid arthritis with rheumatoid factor of multiple sites without organ or systems involvement: Secondary | ICD-10-CM | POA: Diagnosis not present

## 2016-11-16 DIAGNOSIS — G8929 Other chronic pain: Secondary | ICD-10-CM | POA: Diagnosis not present

## 2016-11-16 DIAGNOSIS — M5442 Lumbago with sciatica, left side: Secondary | ICD-10-CM | POA: Diagnosis not present

## 2016-11-18 DIAGNOSIS — M0579 Rheumatoid arthritis with rheumatoid factor of multiple sites without organ or systems involvement: Secondary | ICD-10-CM | POA: Diagnosis not present

## 2016-11-18 DIAGNOSIS — Z79899 Other long term (current) drug therapy: Secondary | ICD-10-CM | POA: Diagnosis not present

## 2016-12-16 DIAGNOSIS — M0579 Rheumatoid arthritis with rheumatoid factor of multiple sites without organ or systems involvement: Secondary | ICD-10-CM | POA: Diagnosis not present

## 2017-01-06 DIAGNOSIS — G894 Chronic pain syndrome: Secondary | ICD-10-CM | POA: Diagnosis not present

## 2017-01-06 DIAGNOSIS — M150 Primary generalized (osteo)arthritis: Secondary | ICD-10-CM | POA: Diagnosis not present

## 2017-01-06 DIAGNOSIS — Z79899 Other long term (current) drug therapy: Secondary | ICD-10-CM | POA: Diagnosis not present

## 2017-01-06 DIAGNOSIS — M0579 Rheumatoid arthritis with rheumatoid factor of multiple sites without organ or systems involvement: Secondary | ICD-10-CM | POA: Diagnosis not present

## 2017-01-18 DIAGNOSIS — Z79899 Other long term (current) drug therapy: Secondary | ICD-10-CM | POA: Diagnosis not present

## 2017-01-18 DIAGNOSIS — M0579 Rheumatoid arthritis with rheumatoid factor of multiple sites without organ or systems involvement: Secondary | ICD-10-CM | POA: Diagnosis not present

## 2017-02-08 DIAGNOSIS — K2970 Gastritis, unspecified, without bleeding: Secondary | ICD-10-CM

## 2017-02-08 HISTORY — DX: Gastritis, unspecified, without bleeding: K29.70

## 2017-02-15 DIAGNOSIS — M0579 Rheumatoid arthritis with rheumatoid factor of multiple sites without organ or systems involvement: Secondary | ICD-10-CM | POA: Diagnosis not present

## 2017-03-10 DIAGNOSIS — G8929 Other chronic pain: Secondary | ICD-10-CM | POA: Insufficient documentation

## 2017-03-10 DIAGNOSIS — T8489XA Other specified complication of internal orthopedic prosthetic devices, implants and grafts, initial encounter: Secondary | ICD-10-CM | POA: Diagnosis not present

## 2017-03-10 DIAGNOSIS — M545 Low back pain: Secondary | ICD-10-CM | POA: Diagnosis not present

## 2017-03-15 DIAGNOSIS — M0579 Rheumatoid arthritis with rheumatoid factor of multiple sites without organ or systems involvement: Secondary | ICD-10-CM | POA: Diagnosis not present

## 2017-03-16 ENCOUNTER — Other Ambulatory Visit (HOSPITAL_COMMUNITY): Payer: Self-pay | Admitting: Physician Assistant

## 2017-03-16 DIAGNOSIS — Z96642 Presence of left artificial hip joint: Secondary | ICD-10-CM

## 2017-03-16 DIAGNOSIS — Z96652 Presence of left artificial knee joint: Secondary | ICD-10-CM | POA: Diagnosis not present

## 2017-03-23 ENCOUNTER — Encounter (HOSPITAL_COMMUNITY)
Admission: RE | Admit: 2017-03-23 | Discharge: 2017-03-23 | Disposition: A | Discharge status: Home / Self Care | Payer: BLUE CROSS/BLUE SHIELD | Source: Ambulatory Visit | Attending: Physician Assistant | Admitting: Physician Assistant

## 2017-03-23 DIAGNOSIS — Z96642 Presence of left artificial hip joint: Secondary | ICD-10-CM | POA: Insufficient documentation

## 2017-03-23 DIAGNOSIS — Z471 Aftercare following joint replacement surgery: Secondary | ICD-10-CM | POA: Diagnosis not present

## 2017-03-23 MED ORDER — TECHNETIUM TC 99M MEDRONATE IV KIT
25.0000 | PACK | Freq: Once | INTRAVENOUS | Status: AC | PRN
  Administered 2017-03-23: 19.5 via INTRAVENOUS

## 2017-03-31 DIAGNOSIS — Z96659 Presence of unspecified artificial knee joint: Secondary | ICD-10-CM | POA: Insufficient documentation

## 2017-03-31 DIAGNOSIS — M1612 Unilateral primary osteoarthritis, left hip: Secondary | ICD-10-CM | POA: Diagnosis not present

## 2017-04-12 DIAGNOSIS — M0579 Rheumatoid arthritis with rheumatoid factor of multiple sites without organ or systems involvement: Secondary | ICD-10-CM | POA: Diagnosis not present

## 2017-04-12 DIAGNOSIS — Z79899 Other long term (current) drug therapy: Secondary | ICD-10-CM | POA: Diagnosis not present

## 2017-04-22 DIAGNOSIS — G894 Chronic pain syndrome: Secondary | ICD-10-CM | POA: Diagnosis not present

## 2017-05-05 DIAGNOSIS — M150 Primary generalized (osteo)arthritis: Secondary | ICD-10-CM | POA: Diagnosis not present

## 2017-05-05 DIAGNOSIS — G894 Chronic pain syndrome: Secondary | ICD-10-CM | POA: Diagnosis not present

## 2017-05-05 DIAGNOSIS — M0579 Rheumatoid arthritis with rheumatoid factor of multiple sites without organ or systems involvement: Secondary | ICD-10-CM | POA: Diagnosis not present

## 2017-05-05 DIAGNOSIS — Z6822 Body mass index (BMI) 22.0-22.9, adult: Secondary | ICD-10-CM | POA: Diagnosis not present

## 2017-05-05 DIAGNOSIS — R634 Abnormal weight loss: Secondary | ICD-10-CM | POA: Diagnosis not present

## 2017-05-09 DIAGNOSIS — Z6822 Body mass index (BMI) 22.0-22.9, adult: Secondary | ICD-10-CM | POA: Diagnosis not present

## 2017-05-09 DIAGNOSIS — M069 Rheumatoid arthritis, unspecified: Secondary | ICD-10-CM | POA: Diagnosis not present

## 2017-05-09 DIAGNOSIS — R05 Cough: Secondary | ICD-10-CM | POA: Diagnosis not present

## 2017-05-09 DIAGNOSIS — F411 Generalized anxiety disorder: Secondary | ICD-10-CM | POA: Diagnosis not present

## 2017-05-09 DIAGNOSIS — R634 Abnormal weight loss: Secondary | ICD-10-CM | POA: Diagnosis not present

## 2017-05-23 DIAGNOSIS — M0579 Rheumatoid arthritis with rheumatoid factor of multiple sites without organ or systems involvement: Secondary | ICD-10-CM | POA: Diagnosis not present

## 2017-06-09 ENCOUNTER — Encounter: Payer: Self-pay | Admitting: Physician Assistant

## 2017-06-20 DIAGNOSIS — M0579 Rheumatoid arthritis with rheumatoid factor of multiple sites without organ or systems involvement: Secondary | ICD-10-CM | POA: Diagnosis not present

## 2017-06-28 ENCOUNTER — Encounter: Payer: Self-pay | Admitting: Physician Assistant

## 2017-06-28 ENCOUNTER — Ambulatory Visit (INDEPENDENT_AMBULATORY_CARE_PROVIDER_SITE_OTHER)
Admission: RE | Admit: 2017-06-28 | Discharge: 2017-06-28 | Disposition: A | Discharge status: Home / Self Care | Payer: BLUE CROSS/BLUE SHIELD | Source: Ambulatory Visit | Attending: Physician Assistant | Admitting: Physician Assistant

## 2017-06-28 ENCOUNTER — Ambulatory Visit (INDEPENDENT_AMBULATORY_CARE_PROVIDER_SITE_OTHER): Payer: BLUE CROSS/BLUE SHIELD | Admitting: Physician Assistant

## 2017-06-28 ENCOUNTER — Other Ambulatory Visit (INDEPENDENT_AMBULATORY_CARE_PROVIDER_SITE_OTHER): Payer: BLUE CROSS/BLUE SHIELD

## 2017-06-28 VITALS — BP 118/66 | HR 91 | Ht 70.0 in | Wt 148.0 lb

## 2017-06-28 DIAGNOSIS — R630 Anorexia: Secondary | ICD-10-CM | POA: Diagnosis not present

## 2017-06-28 DIAGNOSIS — J449 Chronic obstructive pulmonary disease, unspecified: Secondary | ICD-10-CM | POA: Diagnosis not present

## 2017-06-28 DIAGNOSIS — R634 Abnormal weight loss: Secondary | ICD-10-CM

## 2017-06-28 DIAGNOSIS — R110 Nausea: Secondary | ICD-10-CM

## 2017-06-28 LAB — CBC WITH DIFFERENTIAL/PLATELET
BASOS PCT: 0.6 % (ref 0.0–3.0)
Basophils Absolute: 0.1 10*3/uL (ref 0.0–0.1)
EOS ABS: 0.2 10*3/uL (ref 0.0–0.7)
Eosinophils Relative: 1.4 % (ref 0.0–5.0)
HCT: 46.3 % (ref 39.0–52.0)
Hemoglobin: 16.2 g/dL (ref 13.0–17.0)
Lymphocytes Relative: 15.2 % (ref 12.0–46.0)
Lymphs Abs: 1.7 10*3/uL (ref 0.7–4.0)
MCHC: 34.9 g/dL (ref 30.0–36.0)
MCV: 100.7 fl — ABNORMAL HIGH (ref 78.0–100.0)
MONO ABS: 0.5 10*3/uL (ref 0.1–1.0)
MONOS PCT: 4.4 % (ref 3.0–12.0)
NEUTROS ABS: 8.6 10*3/uL — AB (ref 1.4–7.7)
Neutrophils Relative %: 78.4 % — ABNORMAL HIGH (ref 43.0–77.0)
PLATELETS: 199 10*3/uL (ref 150.0–400.0)
RBC: 4.6 Mil/uL (ref 4.22–5.81)
RDW: 13.9 % (ref 11.5–15.5)
WBC: 10.9 10*3/uL — ABNORMAL HIGH (ref 4.0–10.5)

## 2017-06-28 LAB — COMPREHENSIVE METABOLIC PANEL
ALT: 16 U/L (ref 0–53)
AST: 18 U/L (ref 0–37)
Albumin: 4.7 g/dL (ref 3.5–5.2)
Alkaline Phosphatase: 78 U/L (ref 39–117)
BILIRUBIN TOTAL: 0.8 mg/dL (ref 0.2–1.2)
BUN: 22 mg/dL (ref 6–23)
CO2: 32 meq/L (ref 19–32)
Calcium: 9.7 mg/dL (ref 8.4–10.5)
Chloride: 99 mEq/L (ref 96–112)
Creatinine, Ser: 0.65 mg/dL (ref 0.40–1.50)
GFR: 130.38 mL/min (ref >60.00)
GLUCOSE: 105 mg/dL — AB (ref 70–99)
Potassium: 3.8 mEq/L (ref 3.5–5.1)
SODIUM: 137 meq/L (ref 135–145)
TOTAL PROTEIN: 7.3 g/dL (ref 6.0–8.3)

## 2017-06-28 MED ORDER — OMEPRAZOLE 20 MG PO CPDR: DELAYED_RELEASE_CAPSULE | ORAL | 4 refills | Status: DC

## 2017-06-28 NOTE — Patient Instructions (Signed)
Please go to the basement level to have your labs drawn.  Also go to the radiology department basement level for a chest x-ray.  Increase Omeprazole 20 mg to twice daily. Refills sent to pharmacy.   You have been scheduled for a CT scan of the abdomen and pelvis at [facility] ([address]---this is in the same building as Press [facility]).   You are scheduled on Wed 5-30 at 10:00 am. You should arrive at 9:45 am  to your appointment time for registration. Please follow the written instructions below on the day of your exam:  WARNING: IF YOU ARE ALLERGIC TO IODINE/X-RAY DYE, PLEASE NOTIFY RADIOLOGY IMMEDIATELY AT [phone]! YOU WILL BE GIVEN A 13 HOUR PREMEDICATION PREP.  1) Do not eat  anything after 6:00 am (4 hours prior to your test) 2) You have been given 2 bottles of oral contrast to drink. The solution may taste better if refrigerated, but do NOT add ice or any other liquid to this solution. Shake well before drinking.    Drink 1 bottle of contrast @ 8:00 am (2 hours prior to your exam)  Drink 1 bottle of contrast @ 9:00 am (1 hour prior to your exam)  You may take any medications as prescribed with a small amount of water except for the following: Metformin, Glucophage, Glucovance, Avandamet, Riomet, Fortamet, Actoplus Met, Janumet, Glumetza or Metaglip. The above medications must be held the day of the exam AND 48 hours after the exam.  The purpose of you drinking the oral contrast is to aid in the visualization of your intestinal tract. The contrast solution may cause some diarrhea. Before your exam is started, you will be given a small amount of fluid to drink. Depending on your individual set of symptoms, you may also receive an intravenous injection of x-ray contrast/dye. Plan on being at [facility] for 30 minutes or long, depending on the type of exam you are having performed.  If you have any questions regarding your exam or if you need to  reschedule, you may call the CT department at [phone] between the hours of 8:00 am and 5:00 pm, Monday-Friday.  ________________________________________________________________________

## 2017-06-28 NOTE — Progress Notes (Addendum)
Subjective:    Patient ID: Andrew [name], male    DOB: 12/15/1950, 67 y.o.   MRN: [id number]  HPI [name] is a pleasant 67 year old white male known to Dr. Hilarie Fredrickson.  He was last seen in our office in 2013 when he had an endoscopy and colonoscopy.  Colonoscopy was done for screening and normal with exception of small internal hemorrhoids, EGD showed a hiatal hernia and moderate gastritis and duodenitis. Patient comes in today, with complaints of queasiness and nausea after eating over the past 5 to 6 months.  This is been associated with a 30 pound weight loss.  He denies any abdominal pain.  No dysphagia or odynophagia.  Denies any heartburn or indigestion and has not had any vomiting though he says sometimes he thinks he might feel better if he vomited.  He denies any early satiety symptoms, but says he is definitely eating less than he used to.  He denies any changes in bowel habits diarrhea or constipation , no melena or hematochezia.  He does not feel well in general. .  He denies any NSAID use.  No EtOH. Patient does have significant history of rheumatoid arthritis, he has been maintained on methotrexate long-term and is also on a biologic/Tamira which she is receiving by injection once per month, over the past year.  He has had hip replacement and knee replacement, has COPD without oxygen requirement and history of hypothyroidism.  Review of Systems;Pertinent positive and negative review of systems were noted in the above HPI section.  All other review of systems was otherwise negative.  Outpatient Encounter Medications as of 06/28/2017  Medication Sig  . albuterol (PROVENTIL HFA;VENTOLIN HFA) 108 (90 Base) MCG/ACT inhaler Inhale 2 puffs into the lungs every 6 (six) hours as needed for wheezing or shortness of breath. Reported on 1/95/0932  . folic acid (FOLVITE) 1 MG tablet Take 1 mg by mouth daily.  Marland Kitchen levothyroxine (SYNTHROID, LEVOTHROID) 75 MCG tablet Take 75 mcg by mouth daily before  breakfast.  . methotrexate (RHEUMATREX) 2.5 MG tablet Take 25 mg by mouth once a week. 10 pills every Friday  . morphine (MS CONTIN) 30 MG 12 hr tablet Take 30 mg by mouth every 12 (twelve) hours.  Marland Kitchen omeprazole (PRILOSEC) 20 MG capsule Take 1 tablet by mouth twice daily.  Marland Kitchen oxyCODONE-acetaminophen (PERCOCET) 10-325 MG per tablet Take 1 tablet by mouth 3 (three) times daily as needed for pain. Pain  . zolpidem (AMBIEN) 10 MG tablet Take 10 mg by mouth at bedtime.  . [DISCONTINUED] meloxicam (MOBIC) 15 MG tablet Take 15 mg by mouth daily.  . [DISCONTINUED] omeprazole (PRILOSEC) 20 MG capsule Take 20 mg by mouth daily. Acid reflux   No facility-administered encounter medications on file as of 06/28/2017.    Allergies  Allergen Reactions  . Penicillins Nausea Only    REACTION: nausea Has patient had a PCN reaction causing immediate rash, facial/tongue/throat swelling, SOB or lightheadedness with hypotension: unknown Has patient had a PCN reaction causing severe rash involving mucus membranes or skin necrosis: unknown Has patient had a PCN reaction that required hospitalization: yes, was in hospital with pneumonia Has patient had a PCN reaction occurring within the last 10 years: no If all of the above answers are "NO", then may proceed with Cephalosporin use.    Patient Active Problem List   Diagnosis Date Noted  . Enlarged prostate with urinary obstruction 07/24/2015  . History of tobacco abuse 09/24/2014  . Pulmonary nodule, right 02/28/2013  .  CAP (community acquired pneumonia) 02/10/2013  . COPD (chronic obstructive pulmonary disease) ([place]) 02/09/2013  . Acute bronchitis 02/09/2013  . COPD 01/09/2010  . ASTHMA 12/10/2009  . DYSPNEA 12/10/2009  . HYPOTHYROIDISM 12/09/2009  . Rheumatoid arthritis ([place]) 12/09/2009  . BACK PAIN, LUMBAR 12/09/2009   Social History   Socioeconomic History  . Marital status: Married    Spouse name: Not on file  . Number of children: 4  . Years of  education: Not on file  . Highest education level: Not on file  Occupational History  . Occupation: Disabled    Fish farm manager: UNEMPLOYED  Social Needs  . Financial resource strain: Not on file  . Food insecurity:    Worry: Not on file    Inability: Not on file  . Transportation needs:    Medical: Not on file    Non-medical: Not on file  Tobacco Use  . Smoking status: Current Some Day Smoker    Packs/day: 3.00    Years: 40.00    Pack years: 120.00    Types: Cigarettes, Cigars    Last attempt to quit: 02/08/1997    Years since quitting: 20.3  . Smokeless tobacco: Former Systems developer  . Tobacco comment: 02/09/2013 "quit chewing many years ago"  Substance and Sexual Activity  . Alcohol use: No  . Drug use: No  . Sexual activity: Not Currently  Lifestyle  . Physical activity:    Days per week: Not on file    Minutes per session: Not on file  . Stress: Not on file  Relationships  . Social connections:    Talks on phone: Not on file    Gets together: Not on file    Attends religious service: Not on file    Active member of club or organization: Not on file    Attends meetings of clubs or organizations: Not on file    Relationship status: Not on file  . Intimate partner violence:    Fear of current or ex partner: Not on file    Emotionally abused: Not on file    Physically abused: Not on file    Forced sexual activity: Not on file  Other Topics Concern  . Not on file  Social History Narrative  . Not on file    Mr. Levi family history includes COPD in his brother and sister; Cancer in his sister; Esophageal cancer in his brother; Lung cancer in his mother; Multiple sclerosis in his sister.      Objective:    Vitals:   06/28/17 0932  BP: 118/66  Pulse: 91    Physical Exam; well-developed older white male in no acute distress, pleasant blood pressure 118/66, pulse 91, height 5 foot 10, weight 148, MI 21.2.  HEENT ;nontraumatic normocephalic EOMI PERRLA sclera anicteric,  oropharynx clear, Cardiovascular ;regular rate and rhythm with S1-S2 no murmur rub or gallop, Pulmonary; somewhat decreased breath sounds bilaterally no wheezes.  Abdomen ;soft, basically nontender there is no palpable mass or hepatosplenomegaly bowel sounds are present no bruit, Rectal; exam not done, Extremities; no clubbing cyanosis or edema skin warm and dry, Neuro psych ;alert and oriented, grossly nonfocal mood and affect appropriate       Assessment & Plan:   #12 67 year old white male with postprandial queasiness and nausea, decrease in appetite and 30 pound weight loss over the past 5 to 6 months.  This is in the setting of chronic immunosuppression with methotrexate and a biologic for rheumatoid arthritis. Etiology of symptoms is not  clear, but concerning for occult malignancy, consider peptic ulcer disease, gastroparesis.  #2 rheumatoid arthritis   #3 hypothyroidism #4.  COPD, no oxygen requirement #5.  Status post hip and knee replacements # 6.  GERD  Plan; Patient has been on Prilosec 20 mg once daily for GERD, will increase to twice daily. Baseline labs today. Chest x-ray PA and lateral Schedule  for CT scan of the abdomen and pelvis with contrast. If CT and labs are unrevealing, he will need to be scheduled for upper endoscopy with Dr. Hilarie Fredrickson.  Amy Genia Harold PA-C 06/28/2017   Cc: Manon Hilding, MD   Addendum: Reviewed and agree with initial management. [name], Lajuan Lines, MD

## 2017-07-03 DIAGNOSIS — R05 Cough: Secondary | ICD-10-CM | POA: Diagnosis not present

## 2017-07-03 DIAGNOSIS — J441 Chronic obstructive pulmonary disease with (acute) exacerbation: Secondary | ICD-10-CM | POA: Diagnosis not present

## 2017-07-07 ENCOUNTER — Ambulatory Visit (INDEPENDENT_AMBULATORY_CARE_PROVIDER_SITE_OTHER)
Admission: RE | Admit: 2017-07-07 | Discharge: 2017-07-07 | Disposition: A | Discharge status: Home / Self Care | Payer: BLUE CROSS/BLUE SHIELD | Source: Ambulatory Visit | Attending: Physician Assistant | Admitting: Physician Assistant

## 2017-07-07 DIAGNOSIS — R110 Nausea: Secondary | ICD-10-CM

## 2017-07-07 DIAGNOSIS — R634 Abnormal weight loss: Secondary | ICD-10-CM

## 2017-07-07 DIAGNOSIS — R630 Anorexia: Secondary | ICD-10-CM

## 2017-07-07 MED ORDER — IOPAMIDOL (ISOVUE-300) INJECTION 61%
100.0000 mL | Freq: Once | INTRAVENOUS | Status: AC | PRN
  Administered 2017-07-07: 100 mL via INTRAVENOUS

## 2017-07-08 ENCOUNTER — Encounter: Payer: Self-pay | Admitting: Adult Health

## 2017-07-08 ENCOUNTER — Ambulatory Visit (INDEPENDENT_AMBULATORY_CARE_PROVIDER_SITE_OTHER): Payer: BLUE CROSS/BLUE SHIELD | Admitting: Adult Health

## 2017-07-08 DIAGNOSIS — J441 Chronic obstructive pulmonary disease with (acute) exacerbation: Secondary | ICD-10-CM | POA: Diagnosis not present

## 2017-07-08 DIAGNOSIS — Z87891 Personal history of nicotine dependence: Secondary | ICD-10-CM | POA: Diagnosis not present

## 2017-07-08 MED ORDER — DOXYCYCLINE HYCLATE 100 MG PO TABS: 100.0000 mg | ORAL_TABLET | Freq: Two times a day (BID) | ORAL | 0 refills | Status: DC

## 2017-07-08 MED ORDER — TIOTROPIUM BROMIDE-OLODATEROL 2.5-2.5 MCG/ACT IN AERS: 2.0000 | INHALATION_SPRAY | Freq: Every day | RESPIRATORY_TRACT | 5 refills | Status: DC

## 2017-07-08 MED ORDER — TIOTROPIUM BROMIDE-OLODATEROL 2.5-2.5 MCG/ACT IN AERS: 2.0000 | INHALATION_SPRAY | Freq: Every day | RESPIRATORY_TRACT | 0 refills | Status: DC

## 2017-07-08 MED ORDER — PREDNISONE 10 MG PO TABS: ORAL_TABLET | ORAL | 0 refills | Status: DC

## 2017-07-08 NOTE — Progress Notes (Signed)
Patient seen in the office today and instructed on use of Stiolto.  Patient expressed understanding and demonstrated technique. Parke Poisson, CMA 07/08/17

## 2017-07-08 NOTE — Patient Instructions (Signed)
Begin Doxycycline 100mg  Twice daily  For 7 days .  Sputum culture .  Prednisone taper over next week  Mucinex DM Twice daily  As needed  Cough/congesiton  Begin Stiolto 2 puffs daily . Rinse after use.  Work on not smoking .  Follow up with Dr. Lamonte Sakai  In 4 weeks with PFT  And As needed   Please contact office for sooner follow up if symptoms do not improve or worsen or seek emergency care

## 2017-07-08 NOTE — Assessment & Plan Note (Signed)
Smoking cessation  

## 2017-07-08 NOTE — Assessment & Plan Note (Addendum)
Exacerbation -motor resolved Smoking cessation encouraged We will change antibiotics and begin additional steroids Check sputum culture as patient is on immunosuppressants Add LABA Nicut  Patient Instructions  Begin Doxycycline 100mg  Twice daily  For 7 days .  Sputum culture .  Prednisone taper over next week  Mucinex DM Twice daily  As needed  Cough/congesiton  Begin Stiolto 2 puffs daily . Rinse after use.  Work on not smoking .  Follow up with Dr. Lamonte Sakai  In 4 weeks with PFT  And As needed   Please contact office for sooner follow up if symptoms do not improve or worsen or seek emergency care

## 2017-07-08 NOTE — Progress Notes (Signed)
@Patient  ID: Andrew [name], male    DOB: 07-24-1950, 67 y.o.   MRN: [id number]  Chief Complaint  Patient presents with  . Acute Visit    COPD     Referring provider: Manon Hilding, MD  HPI: 67 year old male smoker followed for COPD and pulmonary nodules Past medical history significant for rheumatoid arthritis on methotrexate and biologic  07/08/2017 Acute OV: COPD  Patient presents for an acute office visit.  Complains of 2-3 weeks of increased cough, congestion , thick green mucus and wheezing . Has started back smoking . Seen at urgent care 5 days ago.  Was given a Z-Pak and a prednisone taper.  She has not seen much improvement. Has started back smoking.  Smoking cessation discussed.  40lb weight loss in last year , unintentional .  Has been following with GI.  Had a chest x-ray that was unremarkable on May 23.  CT abdomen and pelvis done earlier this week showed no acute process.  Denies any hemoptysis.      Allergies  Allergen Reactions  . Penicillins Nausea Only    REACTION: nausea Has patient had a PCN reaction causing immediate rash, facial/tongue/throat swelling, SOB or lightheadedness with hypotension: unknown Has patient had a PCN reaction causing severe rash involving mucus membranes or skin necrosis: unknown Has patient had a PCN reaction that required hospitalization: yes, was in hospital with pneumonia Has patient had a PCN reaction occurring within the last 10 years: no If all of the above answers are "NO", then may proceed with Cephalosporin use.     Immunization History  Administered Date(s) Administered  . Influenza Split 11/09/2010, 10/18/2011  . Influenza, High Dose Seasonal PF 02/08/2017  . Influenza,inj,Quad PF,6+ Mos 10/29/2013, 03/31/2015, 11/06/2015  . Influenza-Unspecified 11/08/2012  . Pneumococcal Conjugate-13 08/31/2016  . Pneumococcal Polysaccharide-23 11/08/2009  . Zoster 02/08/2009    Past Medical History:  Diagnosis Date  .  Aneurysm ([place])    POSTERIOR BRAIN - NO FOLLOW UP X 40 YRS  . Asthma    "when I was real young"  . Bruises easily   . Chronic back pain   . Chronic bronchitis ([place])    "get it anytime I catch a cold" (02/09/2013)  . Collagen vascular disease ([place])   . COPD (chronic obstructive pulmonary disease) ([place])   . Daily headache    "here lately" (02/09/2013)  . Difficulty urinating   . Emphysema   . Enlarged prostate   . Exertional shortness of breath    "just walking from room to room sometimes" (02/09/2013)  . Fibromyalgia   . H/O hiatal hernia   . Hepatitis    "I've been told I had hepatitis; had yellow jaundice when I wa young" (02/09/2013)  . Hypothyroidism   . Inguinal hernia   . Insomnia   . Osteoarthritis   . Pneumonia 1970's; 2000's   "I've had it twice" (02/09/2013)  . PONV (postoperative nausea and vomiting)   . Rheumatoid arteritis   . Thyroid disease   . Tick bite of abdomen 07/10/15   completed doxycycline Rx. had rash from abdomen and down anterior thighs    Tobacco History: Social History   Tobacco Use  Smoking Status Current Some Day Smoker  . Packs/day: 3.00  . Years: 40.00  . Pack years: 120.00  . Types: Cigarettes, Cigars  . Last attempt to quit: 02/08/1997  . Years since quitting: 20.4  Smokeless Tobacco Former User  Tobacco Comment   02/09/2013 "quit chewing many years  ago"   Ready to quit: Not Answered Counseling given: Not Answered Comment: 02/09/2013 "quit chewing many years ago"   Outpatient Encounter Medications as of 07/08/2017  Medication Sig  . albuterol (PROVENTIL HFA;VENTOLIN HFA) 108 (90 Base) MCG/ACT inhaler Inhale 2 puffs into the lungs every 6 (six) hours as needed for wheezing or shortness of breath. Reported on 03/31/2015  . albuterol (PROVENTIL) (2.5 MG/3ML) 0.083% nebulizer solution Take 2.5 mg by nebulization every 6 (six) hours as needed for wheezing or shortness of breath.  . folic acid (FOLVITE) 1 MG tablet Take 1 mg by mouth daily.  Marland Kitchen  levothyroxine (SYNTHROID, LEVOTHROID) 75 MCG tablet Take 75 mcg by mouth daily before breakfast.  . methotrexate (RHEUMATREX) 2.5 MG tablet Take 25 mg by mouth once a week. 10 pills every Friday  . morphine (MS CONTIN) 30 MG 12 hr tablet Take 30 mg by mouth every 12 (twelve) hours.  Marland Kitchen omeprazole (PRILOSEC) 20 MG capsule Take 1 tablet by mouth twice daily.  Marland Kitchen oxyCODONE-acetaminophen (PERCOCET) 10-325 MG per tablet Take 1 tablet by mouth 3 (three) times daily as needed for pain. Pain  . zolpidem (AMBIEN) 10 MG tablet Take 10 mg by mouth at bedtime.  Marland Kitchen doxycycline (VIBRA-TABS) 100 MG tablet Take 1 tablet (100 mg total) by mouth 2 (two) times daily.  . predniSONE (DELTASONE) 10 MG tablet 4 tabs for 3 days, then 3 tabs for 3 days, 2 tabs for 3 days, then 1 tab for 3 days, then stop   No facility-administered encounter medications on file as of 07/08/2017.      Review of Systems  Constitutional:   No  weight loss, night sweats,  Fevers, chills,  +fatigue, or  lassitude.  HEENT:   No headaches,  Difficulty swallowing,  Tooth/dental problems, or  Sore throat,                No sneezing, itching, ear ache,  +nasal congestion, post nasal drip,   CV:  No chest pain,  Orthopnea, PND, swelling in lower extremities, anasarca, dizziness, palpitations, syncope.   GI  No heartburn, indigestion, abdominal pain, nausea, vomiting, diarrhea, change in bowel habits, loss of appetite, bloody stools.   Resp: No chest wall deformity  Skin: no rash or lesions.  GU: no dysuria, change in color of urine, no urgency or frequency.  No flank pain, no hematuria   MS:  No joint pain or swelling.  No decreased range of motion.  No back pain.    Physical Exam  BP 140/78 (BP Location: Left Arm, Cuff Size: Normal)   Pulse 94   Temp (!) 97.3 F (36.3 C) (Oral)   Ht 5\' 10"  (1.778 m)   Wt 141 lb 12.8 oz (64.3 kg)   SpO2 93%   BMI 20.35 kg/m   GEN: A/Ox3; pleasant , NAD, thin    HEENT:  [place]/AT,  EACs-clear,  TMs-wnl, NOSE-clear, THROAT-clear, no lesions, no postnasal drip or exudate noted.   NECK:  Supple w/ fair ROM; no JVD; normal carotid impulses w/o bruits; no thyromegaly or nodules palpated; no lymphadenopathy.    RESP few trace rhonchi and expiratory wheezes.,  Speaks in full sentences  no accessory muscle use, no dullness to percussion  CARD:  RRR, no m/r/g, no peripheral edema, pulses intact, no cyanosis or clubbing.  GI:   Soft & nt; nml bowel sounds; no organomegaly or masses detected.   Musco: Warm bil, no deformities or joint swelling noted.   Neuro: alert, no focal  deficits noted.    Skin: Warm, no lesions or rashes    Lab Results:  CBC    Component Value Date/Time   WBC 10.9 (H) 06/28/2017 1031   RBC 4.60 06/28/2017 1031   HGB 16.2 06/28/2017 1031   HCT 46.3 06/28/2017 1031   PLT 199.0 06/28/2017 1031   MCV 100.7 (H) 06/28/2017 1031   MCH 33.9 08/14/2013 1028   MCHC 34.9 06/28/2017 1031   RDW 13.9 06/28/2017 1031   LYMPHSABS 1.7 06/28/2017 1031   MONOABS 0.5 06/28/2017 1031   EOSABS 0.2 06/28/2017 1031   BASOSABS 0.1 06/28/2017 1031    BMET    Component Value Date/Time   NA 137 06/28/2017 1031   K 3.8 06/28/2017 1031   CL 99 06/28/2017 1031   CO2 32 06/28/2017 1031   GLUCOSE 105 (H) 06/28/2017 1031   BUN 22 06/28/2017 1031   CREATININE 0.65 06/28/2017 1031   CALCIUM 9.7 06/28/2017 1031   GFRNONAA >90 08/14/2013 1028   GFRAA >90 08/14/2013 1028    BNP No results found for: BNP  ProBNP    Component Value Date/Time   PROBNP 197.4 (H) 02/09/2013 1400    Imaging: Dg Chest 2 View  Result Date: 06/28/2017 CLINICAL DATA:  Weight loss.  COPD. EXAM: CHEST - 2 VIEW COMPARISON:  Radiographs of May 06, 2016. FINDINGS: The heart size and mediastinal contours are within normal limits. Both lungs are clear. No pneumothorax or pleural effusion is noted. Stimulator leads are seen in lower thoracic spinal canal. Hyperexpansion of the lungs is noted. The  visualized skeletal structures are unremarkable. IMPRESSION: No active cardiopulmonary disease. Findings consistent with chronic obstructive pulmonary disease. Electronically Signed   By: Marijo Conception, M.D.   On: 06/28/2017 14:46   Ct Abdomen Pelvis W Contrast  Result Date: 07/07/2017 CLINICAL DATA:  Patient with decreased appetite and weight loss. EXAM: CT ABDOMEN AND PELVIS WITH CONTRAST TECHNIQUE: Multidetector CT imaging of the abdomen and pelvis was performed using the standard protocol following bolus administration of intravenous contrast. CONTRAST:  191mL ISOVUE-300 IOPAMIDOL (ISOVUE-300) INJECTION 61% COMPARISON:  CT abdomen pelvis 01/04/2012. FINDINGS: Lower chest: Normal heart size. Dependent atelectasis/scarring within the lower lobes bilaterally. Hepatobiliary: Fatty deposition adjacent to the falciform ligament. Stable subcentimeter too small to characterize low-attenuation lesion medial left hepatic lobe (image 21; series 2). Gallbladder is unremarkable. No intrahepatic or extrahepatic biliary ductal dilatation. Pancreas: Unremarkable Spleen: Unremarkable Adrenals/Urinary Tract: Normal adrenal glands. Kidneys enhance symmetrically with contrast. Urinary bladder is unremarkable. No hydronephrosis. Stomach/Bowel: No abnormal bowel wall thickening or evidence for bowel obstruction. No free fluid or free intraperitoneal air. Normal morphology of the stomach. Vascular/Lymphatic: Normal caliber abdominal aorta. Peripheral calcified atherosclerotic plaque. No retroperitoneal lymphadenopathy. Reproductive: Prostate unremarkable. Other: None. Musculoskeletal: Postsurgical changes proximal left femur. Spinal stimulator device. Lumbar spine degenerative changes. IMPRESSION: 1. No acute process within the abdomen or pelvis. Electronically Signed   By: Lovey Newcomer M.D.   On: 07/07/2017 15:00     Assessment & Plan:   COPD (chronic obstructive pulmonary disease) Exacerbation -motor resolved Smoking  cessation encouraged We will change antibiotics and begin additional steroids Check sputum culture as patient is on immunosuppressants Add LABA [place]  Patient Instructions  Begin Doxycycline 100mg  Twice daily  For 7 days .  Sputum culture .  Prednisone taper over next week  Mucinex DM Twice daily  As needed  Cough/congesiton  Begin Stiolto 2 puffs daily . Rinse after use.  Work on not smoking .  Follow up with Dr. Lamonte Sakai  In 4 weeks with PFT  And As needed   Please contact office for sooner follow up if symptoms do not improve or worsen or seek emergency care        History of tobacco abuse Smoking cessation      Rexene Edison, NP 07/08/2017

## 2017-07-08 NOTE — Addendum Note (Signed)
Addended by: Parke Poisson E on: 07/08/2017 04:50 PM   Modules accepted: Orders

## 2017-07-13 ENCOUNTER — Other Ambulatory Visit: Payer: Self-pay

## 2017-07-13 ENCOUNTER — Ambulatory Visit (AMBULATORY_SURGERY_CENTER): Payer: Self-pay | Admitting: *Deleted

## 2017-07-13 ENCOUNTER — Encounter: Payer: Self-pay | Admitting: Internal Medicine

## 2017-07-13 VITALS — Ht 70.0 in | Wt 144.2 lb

## 2017-07-13 DIAGNOSIS — R634 Abnormal weight loss: Secondary | ICD-10-CM

## 2017-07-13 NOTE — Progress Notes (Signed)
Patient denies any allergies to eggs or soy. patient has post-op nausea and vomiting. Patient denies any problems with sedation. Patient denies any oxygen use at home. Patient denies taking any diet/weight loss medications or blood thinners. EMMI education offered, pt declined. Patient denies any medical or surgical changes since OV with Amy,PA.

## 2017-07-18 DIAGNOSIS — M0579 Rheumatoid arthritis with rheumatoid factor of multiple sites without organ or systems involvement: Secondary | ICD-10-CM | POA: Diagnosis not present

## 2017-07-18 DIAGNOSIS — Z79899 Other long term (current) drug therapy: Secondary | ICD-10-CM | POA: Diagnosis not present

## 2017-07-18 DIAGNOSIS — G8929 Other chronic pain: Secondary | ICD-10-CM | POA: Diagnosis not present

## 2017-07-19 ENCOUNTER — Ambulatory Visit (AMBULATORY_SURGERY_CENTER): Payer: BLUE CROSS/BLUE SHIELD | Admitting: Internal Medicine

## 2017-07-19 ENCOUNTER — Other Ambulatory Visit: Payer: Self-pay

## 2017-07-19 ENCOUNTER — Encounter: Payer: Self-pay | Admitting: Internal Medicine

## 2017-07-19 VITALS — BP 126/73 | HR 70 | Temp 97.1°F | Resp 16 | Ht 70.0 in | Wt 144.0 lb

## 2017-07-19 DIAGNOSIS — K2970 Gastritis, unspecified, without bleeding: Secondary | ICD-10-CM | POA: Diagnosis not present

## 2017-07-19 DIAGNOSIS — R634 Abnormal weight loss: Secondary | ICD-10-CM

## 2017-07-19 DIAGNOSIS — B3781 Candidal esophagitis: Secondary | ICD-10-CM | POA: Diagnosis not present

## 2017-07-19 DIAGNOSIS — K2950 Unspecified chronic gastritis without bleeding: Secondary | ICD-10-CM | POA: Diagnosis not present

## 2017-07-19 MED ORDER — SODIUM CHLORIDE 0.9 % IV SOLN: 500.0000 mL | Freq: Once | INTRAVENOUS | Status: DC

## 2017-07-19 MED ORDER — FLUCONAZOLE 100 MG PO TABS: ORAL_TABLET | ORAL | 0 refills | Status: DC

## 2017-07-19 NOTE — Op Note (Signed)
[facility] Patient Name: Andrew [name] Procedure Date: 07/19/2017 10:32 AM MRN: [id number] Endoscopist: Jerene Bears , MD Age: 67 Referring MD:  Date of Birth: [birth year] Gender: Male Account #: [id number] Procedure:                Upper GI endoscopy Indications:              Nausea, Weight loss, recent CT abd/pelvis with                            contrast unremarkable Medicines:                Monitored Anesthesia Care Procedure:                Pre-Anesthesia Assessment:                           - Prior to the procedure, a History and Physical                            was performed, and patient medications and                            allergies were reviewed. The patient's tolerance of                            previous anesthesia was also reviewed. The risks                            and benefits of the procedure and the sedation                            options and risks were discussed with the patient.                            All questions were answered, and informed consent                            was obtained. Prior Anticoagulants: The patient has                            taken no previous anticoagulant or antiplatelet                            agents. ASA Grade Assessment: III - A patient with                            severe systemic disease. After reviewing the risks                            and benefits, the patient was deemed in                            satisfactory condition to undergo the procedure.  After obtaining informed consent, the endoscope was                            passed under direct vision. Throughout the                            procedure, the patient's blood pressure, pulse, and                            oxygen saturations were monitored continuously. The                            Endoscope was introduced through the mouth, and                            advanced to the second part of  duodenum. The upper                            GI endoscopy was accomplished without difficulty.                            The patient tolerated the procedure well. Scope In: Scope Out: Findings:                 Diffuse, white plaques were found in the entire                            esophagus.                           The exam of the esophagus was otherwise normal.                           Patchy mild inflammation characterized by erosions                            and erythema was found on the greater curvature of                            the gastric body and in the gastric antrum.                            Biopsies were taken with a cold forceps for                            histology and Helicobacter pylori testing.                           The cardia and gastric fundus were normal on                            retroflexion.                           The examined duodenum was normal. Complications:  No immediate complications. Estimated Blood Loss:     Estimated blood loss was minimal. Impression:               - Esophageal plaques were found, consistent with                            candidiasis.                           - Gastritis. Biopsied.                           - Normal examined duodenum. Recommendation:           - Patient has a contact number available for                            emergencies. The signs and symptoms of potential                            delayed complications were discussed with the                            patient. Return to normal activities tomorrow.                            Written discharge instructions were provided to the                            patient.                           - Resume previous diet.                           - Continue present medications.                           - Await pathology results.                           - Diflucan (fluconazole) 400 mg x 1 day, 200 mg x                             13 days. Jerene Bears, MD 07/19/2017 10:45:45 AM This report has been signed electronically. CC Letter to:             Campbell Lerner

## 2017-07-19 NOTE — Progress Notes (Signed)
Pt's states no medical or surgical changes since previsit or office visit. 

## 2017-07-19 NOTE — Progress Notes (Signed)
Spontaneous respirations throughout. VSS. Resting comfortably. To PACU on room air. Report to  RN. 

## 2017-07-19 NOTE — Progress Notes (Signed)
Called to room to assist during endoscopic procedure.  Patient ID and intended procedure confirmed with present staff. Received instructions for my participation in the procedure from the performing physician.  

## 2017-07-19 NOTE — Patient Instructions (Signed)
  Handout given for gastritis.  New prescription for diflucan sent to your pharmacy. Start this medication today.   YOU HAD AN ENDOSCOPIC PROCEDURE TODAY AT [facility]:   Refer to the procedure report that was given to you for any specific questions about what was found during the examination.  If the procedure report does not answer your questions, please call your gastroenterologist to clarify.  If you requested that your care partner not be given the details of your procedure findings, then the procedure report has been included in a sealed envelope for you to review at your convenience later.  YOU SHOULD EXPECT: Some feelings of bloating in the abdomen. Passage of more gas than usual.  Walking can help get rid of the air that was put into your GI tract during the procedure and reduce the bloating. If you had a lower endoscopy (such as a colonoscopy or flexible sigmoidoscopy) you may notice spotting of blood in your stool or on the toilet paper. If you underwent a bowel prep for your procedure, you may not have a normal bowel movement for a few days.  Please Note:  You might notice some irritation and congestion in your nose or some drainage.  This is from the oxygen used during your procedure.  There is no need for concern and it should clear up in a day or so.  SYMPTOMS TO REPORT IMMEDIATELY:    Following upper endoscopy (EGD)  Vomiting of blood or coffee ground material  New chest pain or pain under the shoulder blades  Painful or persistently difficult swallowing  New shortness of breath  Fever of 100F or higher  Black, tarry-looking stools  For urgent or emergent issues, a gastroenterologist can be reached at any hour by calling [phone].   DIET:  We do recommend a small meal at first, but then you may proceed to your regular diet.  Drink plenty of fluids but you should avoid alcoholic beverages for 24 hours.  ACTIVITY:  You should plan to take it easy for  the rest of today and you should NOT DRIVE or use heavy machinery until tomorrow (because of the sedation medicines used during the test).    FOLLOW UP: Our staff will call the number listed on your records the next business day following your procedure to check on you and address any questions or concerns that you may have regarding the information given to you following your procedure. If we do not reach you, we will leave a message.  However, if you are feeling well and you are not experiencing any problems, there is no need to return our call.  We will assume that you have returned to your regular daily activities without incident.  If any biopsies were taken you will be contacted by phone or by letter within the next 1-3 weeks.  Please call us at [phone] if you have not heard about the biopsies in 3 weeks.    SIGNATURES/CONFIDENTIALITY: You and/or your care partner have signed paperwork which will be entered into your electronic medical record.  These signatures attest to the fact that that the information above on your After Visit Summary has been reviewed and is understood.  Full responsibility of the confidentiality of this discharge information lies with you and/or your care-partner.

## 2017-07-20 ENCOUNTER — Telehealth: Payer: Self-pay

## 2017-07-20 NOTE — Telephone Encounter (Signed)
  Follow up Call-  Call back number 07/19/2017  Post procedure Call Back phone  # [phone]  Permission to leave phone message Yes  Some recent data might be hidden     Patient questions:  Do you have a fever, pain , or abdominal swelling? No. Pain Score  0 *  Have you tolerated food without any problems? Yes.    Have you been able to return to your normal activities? Yes.    Do you have any questions about your discharge instructions: Diet   No. Medications  No. Follow up visit  No.  Do you have questions or concerns about your Care? No.  Actions: * If pain score is 4 or above: No action needed, pain <4.

## 2017-07-21 ENCOUNTER — Encounter: Payer: Self-pay | Admitting: Internal Medicine

## 2017-08-15 DIAGNOSIS — Z79899 Other long term (current) drug therapy: Secondary | ICD-10-CM | POA: Diagnosis not present

## 2017-08-15 DIAGNOSIS — M0579 Rheumatoid arthritis with rheumatoid factor of multiple sites without organ or systems involvement: Secondary | ICD-10-CM | POA: Diagnosis not present

## 2017-08-16 DIAGNOSIS — R05 Cough: Secondary | ICD-10-CM | POA: Diagnosis not present

## 2017-08-16 DIAGNOSIS — K4190 Unilateral femoral hernia, without obstruction or gangrene, not specified as recurrent: Secondary | ICD-10-CM | POA: Diagnosis not present

## 2017-08-16 DIAGNOSIS — M069 Rheumatoid arthritis, unspecified: Secondary | ICD-10-CM | POA: Diagnosis not present

## 2017-08-16 DIAGNOSIS — R634 Abnormal weight loss: Secondary | ICD-10-CM | POA: Diagnosis not present

## 2017-08-16 DIAGNOSIS — F5101 Primary insomnia: Secondary | ICD-10-CM | POA: Diagnosis not present

## 2017-08-16 DIAGNOSIS — K4091 Unilateral inguinal hernia, without obstruction or gangrene, recurrent: Secondary | ICD-10-CM | POA: Diagnosis not present

## 2017-08-16 DIAGNOSIS — J439 Emphysema, unspecified: Secondary | ICD-10-CM | POA: Diagnosis not present

## 2017-08-16 DIAGNOSIS — Z6820 Body mass index (BMI) 20.0-20.9, adult: Secondary | ICD-10-CM | POA: Diagnosis not present

## 2017-08-16 DIAGNOSIS — Z0001 Encounter for general adult medical examination with abnormal findings: Secondary | ICD-10-CM | POA: Diagnosis not present

## 2017-08-16 DIAGNOSIS — R630 Anorexia: Secondary | ICD-10-CM | POA: Diagnosis not present

## 2017-08-22 DIAGNOSIS — Z791 Long term (current) use of non-steroidal anti-inflammatories (NSAID): Secondary | ICD-10-CM | POA: Diagnosis not present

## 2017-08-22 DIAGNOSIS — Z836 Family history of other diseases of the respiratory system: Secondary | ICD-10-CM | POA: Diagnosis not present

## 2017-08-22 DIAGNOSIS — Z8249 Family history of ischemic heart disease and other diseases of the circulatory system: Secondary | ICD-10-CM | POA: Diagnosis not present

## 2017-08-22 DIAGNOSIS — G4700 Insomnia, unspecified: Secondary | ICD-10-CM | POA: Diagnosis not present

## 2017-08-22 DIAGNOSIS — J449 Chronic obstructive pulmonary disease, unspecified: Secondary | ICD-10-CM | POA: Diagnosis not present

## 2017-08-22 DIAGNOSIS — K4091 Unilateral inguinal hernia, without obstruction or gangrene, recurrent: Secondary | ICD-10-CM | POA: Diagnosis not present

## 2017-08-22 DIAGNOSIS — M1990 Unspecified osteoarthritis, unspecified site: Secondary | ICD-10-CM | POA: Diagnosis not present

## 2017-08-22 DIAGNOSIS — Z79899 Other long term (current) drug therapy: Secondary | ICD-10-CM | POA: Diagnosis not present

## 2017-08-22 DIAGNOSIS — Z859 Personal history of malignant neoplasm, unspecified: Secondary | ICD-10-CM | POA: Diagnosis not present

## 2017-08-22 DIAGNOSIS — Z87891 Personal history of nicotine dependence: Secondary | ICD-10-CM | POA: Diagnosis not present

## 2017-08-22 DIAGNOSIS — K219 Gastro-esophageal reflux disease without esophagitis: Secondary | ICD-10-CM | POA: Diagnosis not present

## 2017-08-22 DIAGNOSIS — M069 Rheumatoid arthritis, unspecified: Secondary | ICD-10-CM | POA: Diagnosis not present

## 2017-08-22 DIAGNOSIS — Z880 Allergy status to penicillin: Secondary | ICD-10-CM | POA: Diagnosis not present

## 2017-08-22 DIAGNOSIS — Z809 Family history of malignant neoplasm, unspecified: Secondary | ICD-10-CM | POA: Diagnosis not present

## 2017-08-22 DIAGNOSIS — I10 Essential (primary) hypertension: Secondary | ICD-10-CM | POA: Diagnosis not present

## 2017-08-22 DIAGNOSIS — F419 Anxiety disorder, unspecified: Secondary | ICD-10-CM | POA: Diagnosis not present

## 2017-08-24 DIAGNOSIS — Z8249 Family history of ischemic heart disease and other diseases of the circulatory system: Secondary | ICD-10-CM | POA: Diagnosis not present

## 2017-08-24 DIAGNOSIS — G4700 Insomnia, unspecified: Secondary | ICD-10-CM | POA: Diagnosis not present

## 2017-08-24 DIAGNOSIS — Z809 Family history of malignant neoplasm, unspecified: Secondary | ICD-10-CM | POA: Diagnosis not present

## 2017-08-24 DIAGNOSIS — Z87891 Personal history of nicotine dependence: Secondary | ICD-10-CM | POA: Diagnosis not present

## 2017-08-24 DIAGNOSIS — I10 Essential (primary) hypertension: Secondary | ICD-10-CM | POA: Diagnosis not present

## 2017-08-24 DIAGNOSIS — Z79899 Other long term (current) drug therapy: Secondary | ICD-10-CM | POA: Diagnosis not present

## 2017-08-24 DIAGNOSIS — M069 Rheumatoid arthritis, unspecified: Secondary | ICD-10-CM | POA: Diagnosis not present

## 2017-08-24 DIAGNOSIS — K219 Gastro-esophageal reflux disease without esophagitis: Secondary | ICD-10-CM | POA: Diagnosis not present

## 2017-08-24 DIAGNOSIS — Z791 Long term (current) use of non-steroidal anti-inflammatories (NSAID): Secondary | ICD-10-CM | POA: Diagnosis not present

## 2017-08-24 DIAGNOSIS — K4091 Unilateral inguinal hernia, without obstruction or gangrene, recurrent: Secondary | ICD-10-CM | POA: Diagnosis not present

## 2017-08-24 DIAGNOSIS — Z859 Personal history of malignant neoplasm, unspecified: Secondary | ICD-10-CM | POA: Diagnosis not present

## 2017-08-24 DIAGNOSIS — K4090 Unilateral inguinal hernia, without obstruction or gangrene, not specified as recurrent: Secondary | ICD-10-CM | POA: Diagnosis not present

## 2017-08-24 DIAGNOSIS — Z836 Family history of other diseases of the respiratory system: Secondary | ICD-10-CM | POA: Diagnosis not present

## 2017-08-24 DIAGNOSIS — M1990 Unspecified osteoarthritis, unspecified site: Secondary | ICD-10-CM | POA: Diagnosis not present

## 2017-08-24 DIAGNOSIS — J449 Chronic obstructive pulmonary disease, unspecified: Secondary | ICD-10-CM | POA: Diagnosis not present

## 2017-08-24 DIAGNOSIS — F419 Anxiety disorder, unspecified: Secondary | ICD-10-CM | POA: Diagnosis not present

## 2017-08-24 DIAGNOSIS — Z880 Allergy status to penicillin: Secondary | ICD-10-CM | POA: Diagnosis not present

## 2017-08-31 ENCOUNTER — Encounter: Payer: Self-pay | Admitting: Emergency Medicine

## 2017-08-31 ENCOUNTER — Ambulatory Visit (INDEPENDENT_AMBULATORY_CARE_PROVIDER_SITE_OTHER): Payer: BLUE CROSS/BLUE SHIELD | Admitting: Emergency Medicine

## 2017-08-31 ENCOUNTER — Ambulatory Visit: Payer: BLUE CROSS/BLUE SHIELD

## 2017-08-31 DIAGNOSIS — M069 Rheumatoid arthritis, unspecified: Secondary | ICD-10-CM

## 2017-08-31 DIAGNOSIS — J441 Chronic obstructive pulmonary disease with (acute) exacerbation: Secondary | ICD-10-CM

## 2017-08-31 DIAGNOSIS — R911 Solitary pulmonary nodule: Secondary | ICD-10-CM | POA: Diagnosis not present

## 2017-08-31 DIAGNOSIS — R918 Other nonspecific abnormal finding of lung field: Secondary | ICD-10-CM

## 2017-08-31 DIAGNOSIS — Z87891 Personal history of nicotine dependence: Secondary | ICD-10-CM | POA: Diagnosis not present

## 2017-08-31 NOTE — Assessment & Plan Note (Signed)
On methotrexate.  We will reimage his chest given the chronic methotrexate use as detailed above.

## 2017-08-31 NOTE — Assessment & Plan Note (Signed)
Unfortunately he has restarted smoking the last year, smoking about a pack a day.  We talked about this today.  I have encouraged him to work hard on cutting down so that we can set a quit date.

## 2017-08-31 NOTE — Progress Notes (Signed)
History of Present Illness:   ROV 08/31/17 --Andrew [name] is 5, has a history of rheumatoid arthritis treated with methotrexate and also biologic therapy.  He also has COPD and history of stable pulmonary nodules followed on CT chest.  Patient was seen in late May 2019 in our office, noted that he had restarted smoking and had symptoms consistent with a possible acute exacerbation COPD.  He was treated with prednisone, antibiotics.  He was asked to restart Stiolto. He had a L inguinal hernia repair last week, so he wasn't able to do PFT today. He isn't sure that the stiolto has helped any. His exertional tolerance may be a bit down. He feels back to baseline. Very rarely needs albuterol. Doesn;t have a flare every year. He is smoking about 1 pk/day.   His last CT scan of the chest was 10/11/2014.  He had a CT abdomen 07/07/2017 that I reviewed.  This shows some dependent atelectasis on lung windows but no significant nodules or other lesions.   Vitals:   08/31/17 1410  BP: 102/78  Pulse: 78  SpO2: 94%  Weight: 142 lb 12.8 oz (64.8 kg)  Height: 5\' 10"  (1.778 m)   Gen: Pleasant, thin, in no distress,  normal affect. He has clearly lost wt since I last saw him  ENT: No lesions,  mouth clear,  oropharynx clear, no postnasal drip  Neck: No JVD, no stridor  Lungs: No use of accessory muscles, no wheezing or crackles  Cardiovascular: RRR, heart sounds normal, no murmur or gallops, no peripheral edema  Musculoskeletal: No deformities, no cyanosis or clubbing  Neuro: alert, non focal  Skin: Warm, no lesions or rashes    10/2014 --  COMPARISON: 03/26/2014  FINDINGS: Mediastinum/Nodes: Tortuous thoracic aorta. Aortic and branch vessel atherosclerosis. Normal heart size, without pericardial effusion. No mediastinal or definite hilar adenopathy, given limitations of unenhanced CT.  Lungs/Pleura: No pleural fluid. Secretions within the trachea and left mainstem bronchus. Lower lobe  predominant bronchial wall thickening. Severe centrilobular emphysema.  Subpleural right middle lobe linear 4 millimeter density in image 46 of series 3 is unchanged.  A subpleural 3-4 millimeter right lower lobe nodule on image 47 is similar.  Left lower lobe 4 millimeter nodule on image 39 is unchanged.  Upper abdomen: Normal imaged portions of the liver, spleen, stomach, pancreas, adrenal glands, left kidney.  Musculoskeletal: Dorsal spinal stimulator. Thoracic spondylosis.  IMPRESSION: 1. Advanced centrilobular emphysema with similar bilateral pulmonary nodules. 2. No acute process in the chest   COPD (chronic obstructive pulmonary disease) Does not flare frequently but he did get treated for a flare at the end of May 2019.  He now feels back to baseline.  He restarted Stiolto but he is not sure whether the Stiolto is helping him.  He uses albuterol rarely.  Unfortunately he is restarted smoking.  We will continue his Stiolto for now, keep albuterol available to use as needed.  We did do his pulmonary function testing today because he just had hernia surgery will defer this until his next visit.  Pulmonary nodules In the setting of rheumatoid arthritis.  His last CT was 2016.  I believe he needs a repeat and we will arrange for this and in October.  History of tobacco abuse Unfortunately he has restarted smoking the last year, smoking about a pack a day.  We talked about this today.  I have encouraged him to work hard on cutting down so that we can set a quit date.  Rheumatoid arthritis (HCC) On methotrexate.  We will reimage his chest given the chronic methotrexate use as detailed above.  [name], MD, PhD 08/31/2017, 2:46 PM [facility] [phone] or if no answer [phone]

## 2017-08-31 NOTE — Addendum Note (Signed)
Addended by: Madolyn Frieze on: 08/31/2017 02:51 PM   Modules accepted: Orders

## 2017-08-31 NOTE — Assessment & Plan Note (Signed)
Does not flare frequently but he did get treated for a flare at the end of May 2019.  He now feels back to baseline.  He restarted Stiolto but he is not sure whether the Stiolto is helping him.  He uses albuterol rarely.  Unfortunately he is restarted smoking.  We will continue his Stiolto for now, keep albuterol available to use as needed.  We did do his pulmonary function testing today because he just had hernia surgery will defer this until his next visit.

## 2017-08-31 NOTE — Patient Instructions (Addendum)
We will perform a CT chest without contrast in October 2019 to follow pulmonary nodules and to evaluate for changes due to methotrexate.  Please work hard on decreasing your smoking. Our ultimate goal will be to stop altogether.  We will continue Stiolto 2 puffs once a day Keep albuterol available to use 2 puffs up to every 4 hours if needed for shortness of breath, wheezing, chest tightness.  We will repeat your pulmonary function testing at your next office visit.  Follow with Dr Lamonte Sakai in October with full PFT on the same day.

## 2017-08-31 NOTE — Assessment & Plan Note (Signed)
In the setting of rheumatoid arthritis.  His last CT was 2016.  I believe he needs a repeat and we will arrange for this and in October.

## 2017-09-13 DIAGNOSIS — R634 Abnormal weight loss: Secondary | ICD-10-CM | POA: Diagnosis not present

## 2017-09-13 DIAGNOSIS — Z6820 Body mass index (BMI) 20.0-20.9, adult: Secondary | ICD-10-CM | POA: Diagnosis not present

## 2017-09-13 DIAGNOSIS — G894 Chronic pain syndrome: Secondary | ICD-10-CM | POA: Diagnosis not present

## 2017-09-13 DIAGNOSIS — M0579 Rheumatoid arthritis with rheumatoid factor of multiple sites without organ or systems involvement: Secondary | ICD-10-CM | POA: Diagnosis not present

## 2017-09-13 DIAGNOSIS — M150 Primary generalized (osteo)arthritis: Secondary | ICD-10-CM | POA: Diagnosis not present

## 2017-09-19 DIAGNOSIS — M0579 Rheumatoid arthritis with rheumatoid factor of multiple sites without organ or systems involvement: Secondary | ICD-10-CM | POA: Diagnosis not present

## 2017-10-18 DIAGNOSIS — M0579 Rheumatoid arthritis with rheumatoid factor of multiple sites without organ or systems involvement: Secondary | ICD-10-CM | POA: Diagnosis not present

## 2017-10-27 ENCOUNTER — Encounter: Payer: Self-pay | Admitting: *Deleted

## 2017-11-01 ENCOUNTER — Encounter

## 2017-11-01 ENCOUNTER — Ambulatory Visit: Payer: BLUE CROSS/BLUE SHIELD | Admitting: Internal Medicine

## 2017-11-08 ENCOUNTER — Ambulatory Visit (HOSPITAL_COMMUNITY)
Admission: RE | Admit: 2017-11-08 | Discharge: 2017-11-08 | Disposition: A | Discharge status: Home / Self Care | Payer: BLUE CROSS/BLUE SHIELD | Source: Ambulatory Visit | Attending: Emergency Medicine | Admitting: Emergency Medicine

## 2017-11-08 DIAGNOSIS — I700 Atherosclerosis of aorta: Secondary | ICD-10-CM | POA: Insufficient documentation

## 2017-11-08 DIAGNOSIS — R911 Solitary pulmonary nodule: Secondary | ICD-10-CM

## 2017-11-08 DIAGNOSIS — R918 Other nonspecific abnormal finding of lung field: Secondary | ICD-10-CM | POA: Insufficient documentation

## 2017-11-08 DIAGNOSIS — J432 Centrilobular emphysema: Secondary | ICD-10-CM | POA: Diagnosis not present

## 2017-11-15 DIAGNOSIS — M0579 Rheumatoid arthritis with rheumatoid factor of multiple sites without organ or systems involvement: Secondary | ICD-10-CM | POA: Diagnosis not present

## 2017-11-16 DIAGNOSIS — R634 Abnormal weight loss: Secondary | ICD-10-CM | POA: Diagnosis not present

## 2017-11-16 DIAGNOSIS — R05 Cough: Secondary | ICD-10-CM | POA: Diagnosis not present

## 2017-11-16 DIAGNOSIS — F411 Generalized anxiety disorder: Secondary | ICD-10-CM | POA: Diagnosis not present

## 2017-11-16 DIAGNOSIS — M069 Rheumatoid arthritis, unspecified: Secondary | ICD-10-CM | POA: Diagnosis not present

## 2017-11-17 DIAGNOSIS — Z79891 Long term (current) use of opiate analgesic: Secondary | ICD-10-CM | POA: Diagnosis not present

## 2017-11-17 DIAGNOSIS — M545 Low back pain: Secondary | ICD-10-CM | POA: Diagnosis not present

## 2017-11-17 DIAGNOSIS — G8929 Other chronic pain: Secondary | ICD-10-CM | POA: Diagnosis not present

## 2017-11-17 DIAGNOSIS — Z79899 Other long term (current) drug therapy: Secondary | ICD-10-CM | POA: Diagnosis not present

## 2017-12-12 DIAGNOSIS — R1032 Left lower quadrant pain: Secondary | ICD-10-CM | POA: Diagnosis not present

## 2017-12-12 DIAGNOSIS — Z6820 Body mass index (BMI) 20.0-20.9, adult: Secondary | ICD-10-CM | POA: Diagnosis not present

## 2017-12-13 DIAGNOSIS — Z79899 Other long term (current) drug therapy: Secondary | ICD-10-CM | POA: Diagnosis not present

## 2017-12-13 DIAGNOSIS — M0579 Rheumatoid arthritis with rheumatoid factor of multiple sites without organ or systems involvement: Secondary | ICD-10-CM | POA: Diagnosis not present

## 2017-12-14 DIAGNOSIS — T8189XA Other complications of procedures, not elsewhere classified, initial encounter: Secondary | ICD-10-CM | POA: Diagnosis not present

## 2018-01-12 DIAGNOSIS — M0579 Rheumatoid arthritis with rheumatoid factor of multiple sites without organ or systems involvement: Secondary | ICD-10-CM | POA: Diagnosis not present

## 2018-01-13 DIAGNOSIS — G894 Chronic pain syndrome: Secondary | ICD-10-CM | POA: Diagnosis not present

## 2018-01-13 DIAGNOSIS — M150 Primary generalized (osteo)arthritis: Secondary | ICD-10-CM | POA: Diagnosis not present

## 2018-01-13 DIAGNOSIS — R634 Abnormal weight loss: Secondary | ICD-10-CM | POA: Diagnosis not present

## 2018-01-13 DIAGNOSIS — M0579 Rheumatoid arthritis with rheumatoid factor of multiple sites without organ or systems involvement: Secondary | ICD-10-CM | POA: Diagnosis not present

## 2018-02-13 DIAGNOSIS — M0579 Rheumatoid arthritis with rheumatoid factor of multiple sites without organ or systems involvement: Secondary | ICD-10-CM | POA: Diagnosis not present

## 2018-03-16 DIAGNOSIS — M0579 Rheumatoid arthritis with rheumatoid factor of multiple sites without organ or systems involvement: Secondary | ICD-10-CM | POA: Diagnosis not present

## 2018-03-24 DIAGNOSIS — M542 Cervicalgia: Secondary | ICD-10-CM | POA: Diagnosis not present

## 2018-03-24 DIAGNOSIS — M5136 Other intervertebral disc degeneration, lumbar region: Secondary | ICD-10-CM | POA: Diagnosis not present

## 2018-03-24 DIAGNOSIS — G894 Chronic pain syndrome: Secondary | ICD-10-CM | POA: Diagnosis not present

## 2018-03-24 DIAGNOSIS — M545 Low back pain: Secondary | ICD-10-CM | POA: Diagnosis not present

## 2018-04-13 DIAGNOSIS — M0579 Rheumatoid arthritis with rheumatoid factor of multiple sites without organ or systems involvement: Secondary | ICD-10-CM | POA: Diagnosis not present

## 2018-05-11 DIAGNOSIS — M0579 Rheumatoid arthritis with rheumatoid factor of multiple sites without organ or systems involvement: Secondary | ICD-10-CM | POA: Diagnosis not present

## 2018-06-08 DIAGNOSIS — M0579 Rheumatoid arthritis with rheumatoid factor of multiple sites without organ or systems involvement: Secondary | ICD-10-CM | POA: Diagnosis not present

## 2018-07-06 DIAGNOSIS — M0579 Rheumatoid arthritis with rheumatoid factor of multiple sites without organ or systems involvement: Secondary | ICD-10-CM | POA: Diagnosis not present

## 2018-07-18 DIAGNOSIS — M0579 Rheumatoid arthritis with rheumatoid factor of multiple sites without organ or systems involvement: Secondary | ICD-10-CM | POA: Diagnosis not present

## 2018-07-18 DIAGNOSIS — M150 Primary generalized (osteo)arthritis: Secondary | ICD-10-CM | POA: Diagnosis not present

## 2018-07-18 DIAGNOSIS — R5382 Chronic fatigue, unspecified: Secondary | ICD-10-CM | POA: Diagnosis not present

## 2018-07-18 DIAGNOSIS — Z6820 Body mass index (BMI) 20.0-20.9, adult: Secondary | ICD-10-CM | POA: Diagnosis not present

## 2018-07-18 DIAGNOSIS — R634 Abnormal weight loss: Secondary | ICD-10-CM | POA: Diagnosis not present

## 2018-07-18 DIAGNOSIS — G894 Chronic pain syndrome: Secondary | ICD-10-CM | POA: Diagnosis not present

## 2018-07-21 DIAGNOSIS — G894 Chronic pain syndrome: Secondary | ICD-10-CM | POA: Diagnosis not present

## 2018-07-21 DIAGNOSIS — Z79891 Long term (current) use of opiate analgesic: Secondary | ICD-10-CM | POA: Diagnosis not present

## 2018-07-21 DIAGNOSIS — G8929 Other chronic pain: Secondary | ICD-10-CM | POA: Diagnosis not present

## 2018-08-03 DIAGNOSIS — M0579 Rheumatoid arthritis with rheumatoid factor of multiple sites without organ or systems involvement: Secondary | ICD-10-CM | POA: Diagnosis not present

## 2018-08-03 DIAGNOSIS — Z79899 Other long term (current) drug therapy: Secondary | ICD-10-CM | POA: Diagnosis not present

## 2018-08-28 ENCOUNTER — Encounter: Payer: Self-pay | Admitting: *Deleted

## 2018-08-28 ENCOUNTER — Telehealth: Payer: Self-pay | Admitting: *Deleted

## 2018-08-28 NOTE — Telephone Encounter (Signed)
Covid-19 screening questions   Do you now or have you had a fever in the last 14 days? NO  Do you have any respiratory symptoms of shortness of breath or cough now or in the last 14 days? With exertion only  Do you have any family members or close contacts with diagnosed or suspected Covid-19 in the past 14 days? NO  Have you been tested for Covid-19 and found to be positive? NO

## 2018-08-29 ENCOUNTER — Encounter: Payer: Self-pay | Admitting: Internal Medicine

## 2018-08-29 ENCOUNTER — Ambulatory Visit (INDEPENDENT_AMBULATORY_CARE_PROVIDER_SITE_OTHER): Payer: BC Managed Care – PPO | Admitting: Internal Medicine

## 2018-08-29 VITALS — BP 140/70 | HR 101 | Temp 98.1°F | Ht 70.0 in | Wt 139.0 lb

## 2018-08-29 DIAGNOSIS — J449 Chronic obstructive pulmonary disease, unspecified: Secondary | ICD-10-CM

## 2018-08-29 DIAGNOSIS — R6881 Early satiety: Secondary | ICD-10-CM | POA: Diagnosis not present

## 2018-08-29 DIAGNOSIS — R634 Abnormal weight loss: Secondary | ICD-10-CM | POA: Diagnosis not present

## 2018-08-29 DIAGNOSIS — R110 Nausea: Secondary | ICD-10-CM

## 2018-08-29 NOTE — Patient Instructions (Addendum)
Please discontinue omeprazole. ____________________________________________________________  Andrew [name] have been scheduled for a gastric emptying scan at [facility] on 09/14/2018 at 7:30 AM. Please arrive at least 15 minutes prior to your appointment for registration. Please make certain not to have anything to eat or drink after midnight the night before your test. Hold all stomach medications (ex: Zofran, phenergan, Reglan) 48 hours prior to your test. If you need to reschedule your appointment, please contact radiology scheduling at [phone]. _____________________________________________________________  A gastric-emptying study measures how long it takes for food to move through your stomach. There are several ways to measure stomach emptying. In the most common test, you eat food that contains a small amount of radioactive material. A scanner that detects the movement of the radioactive material is placed over your abdomen to monitor the rate at which food leaves your stomach. This test normally takes about 4 hours to complete. _____________________________________________________________  If you are age 55 or older, your body mass index should be between 23-30. Your Body mass index is 19.94 kg/m. If this is out of the aforementioned range listed, please consider follow up with your Primary Care Provider.  If you are age 68 or younger, your body mass index should be between 19-25. Your Body mass index is 19.94 kg/m. If this is out of the aformentioned range listed, please consider follow up with your Primary Care Provider.  __________________________________________________________ Andrew [name] have been scheduled to see Dr Lamonte Sakai on 08/30/18 at 3:15 pm. Their new office is located at [address] [place],  [address]. Main Line: [phone]

## 2018-08-29 NOTE — Progress Notes (Signed)
ges

## 2018-08-30 ENCOUNTER — Ambulatory Visit: Payer: Medicare Other | Admitting: Emergency Medicine

## 2018-08-30 ENCOUNTER — Ambulatory Visit (INDEPENDENT_AMBULATORY_CARE_PROVIDER_SITE_OTHER): Payer: BC Managed Care – PPO | Admitting: Nurse Practitioner

## 2018-08-30 ENCOUNTER — Other Ambulatory Visit: Payer: Self-pay

## 2018-08-30 ENCOUNTER — Encounter: Payer: Self-pay | Admitting: Nurse Practitioner

## 2018-08-30 DIAGNOSIS — R918 Other nonspecific abnormal finding of lung field: Secondary | ICD-10-CM | POA: Diagnosis not present

## 2018-08-30 DIAGNOSIS — Z87891 Personal history of nicotine dependence: Secondary | ICD-10-CM

## 2018-08-30 NOTE — Patient Instructions (Signed)
Please work hard on decreasing your smoking. Our ultimate goal will be to stop altogether.  We will continue Stiolto 2 puffs once a day Keep albuterol available to use 2 puffs up to every 4 hours if needed for shortness of breath, wheezing, chest tightness.  We will repeat your pulmonary function testing at your next office visit.   Follow with Dr Lamonte Sakai in 3-4 months with full PFT on the same day.

## 2018-08-30 NOTE — Progress Notes (Signed)
@Patient  ID: Andrew [name], male    DOB: Oct 06, 1950, 68 y.o.   MRN: [id number]  Chief Complaint  Patient presents with  . Follow-up    COPD - Has not had to use his medications for more than 4 months.     Referring provider: Manon Hilding, MD  HPI  68 year old male active smoker with COPD, pulmonary nodules, tobacco abuse, rheumatoid arthritis who is followed by Dr. Lamonte Sakai.  Tests:  CT chest 11/08/17 - No significant change in pulmonary nodules from 2016 allowing for technical differences. Severe Emphysema. Mild Aortic Atherosclerosis. No acute findings.    OV 08/31/18 - Follow up Patient presents today for a follow-up.  Patient does have a history of rheumatoid arthritis and was treated with methotrexate and biologic therapy.  Patient does have a history of COPD and a history of stable pulmonary nodules followed on CT.  Patient states that this is been a stable interval for him.  Overall he has been doing well.  He was last seen by Dr. Lamonte Sakai on 08/31/2017.  Patient had a follow-up CT scan in October 2019 which showed no significant change in pulmonary nodules from 2016.  Unfortunately, patient is still smoking.  He started back smoking around a year ago.  He states that he smokes around a pack per day.  He denies any significant shortness of breath or any significant cough.  He has stopped using his inhalers he has not needed his Stiolto or albuterol in the past few months.  Patient was ordered a PFT at his last visit with Dr. Lamonte Sakai but has not been able to get this completed at this time.  Patient is active and performs activities of daily living independently.  He is able to do a lot of strenuous yard work. Denies f/c/s, n/v/d, hemoptysis, PND, leg swelling.      Allergies  Allergen Reactions  . Penicillins Nausea Only    REACTION: nausea Has patient had a PCN reaction causing immediate rash, facial/tongue/throat swelling, SOB or lightheadedness with hypotension: unknown Has  patient had a PCN reaction causing severe rash involving mucus membranes or skin necrosis: unknown Has patient had a PCN reaction that required hospitalization: yes, was in hospital with pneumonia Has patient had a PCN reaction occurring within the last 10 years: no If all of the above answers are "NO", then may proceed with Cephalosporin use.  REACTION: nausea Has patient had a PCN reaction causing immediate rash, facial/tongue/throat swelling, SOB or lightheadedness with hypotension: unknown Has patient had a PCN reaction causing severe rash involving mucus membranes or skin necrosis: unknown Has patient had a PCN reaction that required hospitalization: yes, was in hospital with pneumonia Has patient had a PCN reaction occurring within the last 10 years: no If all of the above answers are "NO", then may proceed with Cephalosporin use. REACTION: nausea Has patient had a PCN reaction causing immediate rash, facial/tongue/throat swelling, SOB or lightheadedness with hypotension: unknown Has patient had a PCN reaction causing severe rash involving mucus membranes or skin necrosis: unknown Has patient had a PCN reaction that required hospitalization: yes, was in hospital with pneumonia Has patient had a PCN reaction occurring within the last 10 years: no If all of the above answers are "NO", then may proceed with Cephalosporin use.    Immunization History  Administered Date(s) Administered  . Influenza Split 11/09/2010, 10/18/2011  . Influenza, High Dose Seasonal PF 02/08/2017  . Influenza,inj,Quad PF,6+ Mos 10/29/2013, 03/31/2015, 11/06/2015  . Influenza,inj,quad, With Preservative  02/09/2016  . Influenza-Unspecified 11/08/2012  . Pneumococcal Conjugate-13 08/31/2016  . Pneumococcal Polysaccharide-23 11/08/2009  . Zoster 02/08/2009    Past Medical History:  Diagnosis Date  . Aneurysm ([place])    POSTERIOR BRAIN - NO FOLLOW UP X 40 YRS  . Asthma    "when I was real young"  . Bruises  easily   . Chronic back pain   . Chronic bronchitis ([place])    "get it anytime I catch a cold" (02/09/2013)  . Collagen vascular disease ([place])   . COPD (chronic obstructive pulmonary disease) ([facility])    no O2 use at home per pt.,uses breathing [place] prn  . Daily headache    "here lately" (02/09/2013)  . Difficulty urinating   . Emphysema   . Enlarged prostate   . Exertional shortness of breath    "just walking from room to room sometimes" (02/09/2013)  . Fibromyalgia   . Gastritis 2019   chronic inactive  . H/O hiatal hernia   . Hepatitis    "I've been told I had hepatitis; had yellow jaundice when I wa young" (02/09/2013)  . Hypothyroidism   . Inguinal hernia   . Insomnia   . Internal hemorrhoids   . Osteoarthritis   . Pneumonia 1970's; 2000's   "I've had it twice" (02/09/2013)  . PONV (postoperative nausea and vomiting)   . Rheumatoid arteritis ([place])   . Thyroid disease   . Tick bite of abdomen 07/10/15   completed doxycycline Rx. had rash from abdomen and down anterior thighs    Tobacco History: Social History   Tobacco Use  Smoking Status Current Every Day Smoker  . Packs/day: 1.00  . Years: 40.00  . Pack years: 40.00  . Types: Cigarettes, Cigars  . Last attempt to quit: 02/08/1997  . Years since quitting: 21.5  Smokeless Tobacco Former Systems developer  . Types: Chew   Ready to quit: No Counseling given: Yes   Outpatient Encounter Medications as of 08/30/2018  Medication Sig  . albuterol (PROVENTIL HFA;VENTOLIN HFA) 108 (90 Base) MCG/ACT inhaler Inhale 2 puffs into the lungs every 6 (six) hours as needed for wheezing or shortness of breath. Reported on 03/31/2015  . albuterol (PROVENTIL) (2.5 MG/3ML) 0.083% nebulizer solution Take 2.5 mg by nebulization every 6 (six) hours as needed for wheezing or shortness of breath.  . doxycycline (VIBRA-TABS) 100 MG tablet Take 1 tablet (100 mg total) by mouth 2 (two) times daily.  . folic acid (FOLVITE) 1 MG tablet Take 1 mg by mouth daily.  Marland Kitchen  levothyroxine (SYNTHROID, LEVOTHROID) 75 MCG tablet Take 75 mcg by mouth daily before breakfast.  . meloxicam (MOBIC) 15 MG tablet Take 1 tablet by mouth daily.  . methotrexate (RHEUMATREX) 2.5 MG tablet Take 25 mg by mouth once a week. 10 pills every Friday  . morphine (MS CONTIN) 30 MG 12 hr tablet Take 30 mg by mouth every 12 (twelve) hours.  Marland Kitchen oxyCODONE-acetaminophen (PERCOCET) 10-325 MG per tablet Take 1 tablet by mouth 3 (three) times daily as needed for pain. Pain  . predniSONE (DELTASONE) 10 MG tablet 4 tabs for 3 days, then 3 tabs for 3 days, 2 tabs for 3 days, then 1 tab for 3 days, then stop  . Tiotropium Bromide-Olodaterol (STIOLTO RESPIMAT) 2.5-2.5 MCG/ACT AERS Inhale 2 puffs into the lungs daily.  Marland Kitchen zolpidem (AMBIEN) 10 MG tablet Take 10 mg by mouth at bedtime.   Facility-Administered Encounter Medications as of 08/30/2018  Medication  . 0.9 %  sodium chloride  infusion     Review of Systems  Review of Systems  Constitutional: Negative.  Negative for chills and fever.  HENT: Negative.   Respiratory: Negative for cough, shortness of breath and wheezing.   Cardiovascular: Negative.  Negative for chest pain, palpitations and leg swelling.  Gastrointestinal: Negative.   Allergic/Immunologic: Negative.   Neurological: Negative.   Psychiatric/Behavioral: Negative.        Physical Exam  BP 130/74 (BP Location: Left Arm, Patient Position: Sitting, Cuff Size: Normal)   Pulse 88   Temp 98.2 F (36.8 C)   Ht 5\' 10"  (1.778 m)   Wt 137 lb 6.4 oz (62.3 kg)   SpO2 96%   BMI 19.71 kg/m   Wt Readings from Last 5 Encounters:  08/30/18 137 lb 6.4 oz (62.3 kg)  08/29/18 139 lb (63 kg)  08/31/17 142 lb 12.8 oz (64.8 kg)  07/19/17 144 lb (65.3 kg)  07/13/17 144 lb 3.2 oz (65.4 kg)     Physical Exam Vitals signs and nursing note reviewed.  Constitutional:      General: He is not in acute distress.    Appearance: He is well-developed.  Cardiovascular:     Rate and Rhythm:  Normal rate and regular rhythm.  Pulmonary:     Effort: Pulmonary effort is normal. No respiratory distress.     Breath sounds: Normal breath sounds. No wheezing or rhonchi.  Musculoskeletal:        General: No swelling.  Skin:    General: Skin is warm and dry.  Neurological:     Mental Status: He is alert and oriented to person, place, and time.       Assessment & Plan:   COPD COPD : This has been a stable interval for patient.  Patient has not used his inhalers in several months.  He states that he feels like he does not need them at this time.  He was previously on Stiolto and albuterol as needed.  We will keep this on his med list for refills in case he decides to start using them again.  Patient does need a PFT.  Patient Instructions  Please work hard on decreasing your smoking. Our ultimate goal will be to stop altogether.  We will continue Stiolto 2 puffs once a day Keep albuterol available to use 2 puffs up to every 4 hours if needed for shortness of breath, wheezing, chest tightness.  We will repeat your pulmonary function testing at your next office visit.   Follow with Dr Lamonte Sakai in 3-4 months with full PFT on the same day.     History of tobacco abuse Patient is currently smoking.  He states that he smokes a around a pack a day. Smoking cessation instruction/counseling given:  counseled patient on the dangers of tobacco use, advised patient to stop smoking, and reviewed strategies to maximize success   Pulmonary nodules In the setting of rheumatoid arthritis.  Patient's last CT was October 2019 and was stable.       Fenton Foy, NP 08/31/2018

## 2018-08-31 ENCOUNTER — Encounter: Payer: Self-pay | Admitting: Nurse Practitioner

## 2018-08-31 DIAGNOSIS — M0579 Rheumatoid arthritis with rheumatoid factor of multiple sites without organ or systems involvement: Secondary | ICD-10-CM | POA: Diagnosis not present

## 2018-08-31 NOTE — Assessment & Plan Note (Signed)
COPD : This has been a stable interval for patient.  Patient has not used his inhalers in several months.  He states that he feels like he does not need them at this time.  He was previously on Stiolto and albuterol as needed.  We will keep this on his med list for refills in case he decides to start using them again.  Patient does need a PFT.  Patient Instructions  Please work hard on decreasing your smoking. Our ultimate goal will be to stop altogether.  We will continue Stiolto 2 puffs once a day Keep albuterol available to use 2 puffs up to every 4 hours if needed for shortness of breath, wheezing, chest tightness.  We will repeat your pulmonary function testing at your next office visit.   Follow with Dr Lamonte Sakai in 3-4 months with full PFT on the same day.

## 2018-08-31 NOTE — Assessment & Plan Note (Signed)
Patient is currently smoking.  He states that he smokes a around a pack a day. Smoking cessation instruction/counseling given:  counseled patient on the dangers of tobacco use, advised patient to stop smoking, and reviewed strategies to maximize success

## 2018-08-31 NOTE — Progress Notes (Signed)
Subjective:    Patient ID: Andrew [name], male    DOB: 1950-10-24, 68 y.o.   MRN: [id number]  HPI Rolin Schult is a 68 yo male with PMH of gastritis and duodenitis, COPD, rheumatoid arthritis, who is seen in follow-up again to evaluate weight loss.  He is here alone today.  He was seen by Nicoletta Ba, PA-C in May 2019 also to evaluate weight loss.  We repeated an upper endoscopy after this appointment which was performed on 07/19/2017.  Upper endoscopy showed Candida esophagitis and mild patchy erosions and erythema in the gastric body and antrum.  Biopsies were performed and negative for H. pylori.  He was treated for Candida esophagitis.  He had a colonoscopy which was normal in October 2013 other than small internal hemorrhoids.  Cross-sectional imaging is been done both of the chest, abdomen pelvis without overt cause for weight loss.  He does have severe COPD and has been seen by Dr. Lamonte Sakai.  He reports that his appetite fluctuates.  His weight has been relatively stable over the last 12 months but he is down 9 pounds since May 2019.  He is using omeprazole only intermittently maybe 2 or 3 times a week.  At times he wonders if the omeprazole makes him have more nausea.  He does notice some nausea with eating no vomiting.  He is tried to vomit a few times but unsuccessfully.  He does occasionally feel early fullness.  No bloating.  No abdominal pain.  No change in bowel habits, no diarrhea, constipation, blood in stool or melena.  He does drink coffee all day.   Review of Systems As per HPI otherwise negative  Current Medications, Allergies, Past Medical History, Past Surgical History, Family History and Social History were reviewed in [employer] record.     Objective:   Physical Exam BP 140/70   Pulse (!) 101   Temp 98.1 F (36.7 C)   Ht 5\' 10"  (1.778 m)   Wt 139 lb (63 kg)   SpO2 95%   BMI 19.94 kg/m  Gen: awake, alert, NAD HEENT: anicteric, op  clear CV: RRR, no mrg Pulm: Somewhat distant breath sounds with scattered rhonchi Abd: soft, NT/ND, +BS throughout Ext: no c/c/e Neuro: nonfocal  CBC    Component Value Date/Time   WBC 10.9 (H) 06/28/2017 1031   RBC 4.60 06/28/2017 1031   HGB 16.2 06/28/2017 1031   HCT 46.3 06/28/2017 1031   PLT 199.0 06/28/2017 1031   MCV 100.7 (H) 06/28/2017 1031   MCH 33.9 08/14/2013 1028   MCHC 34.9 06/28/2017 1031   RDW 13.9 06/28/2017 1031   LYMPHSABS 1.7 06/28/2017 1031   MONOABS 0.5 06/28/2017 1031   EOSABS 0.2 06/28/2017 1031   BASOSABS 0.1 06/28/2017 1031   CMP     Component Value Date/Time   NA 137 06/28/2017 1031   K 3.8 06/28/2017 1031   CL 99 06/28/2017 1031   CO2 32 06/28/2017 1031   GLUCOSE 105 (H) 06/28/2017 1031   BUN 22 06/28/2017 1031   CREATININE 0.65 06/28/2017 1031   CALCIUM 9.7 06/28/2017 1031   PROT 7.3 06/28/2017 1031   ALBUMIN 4.7 06/28/2017 1031   AST 18 06/28/2017 1031   ALT 16 06/28/2017 1031   ALKPHOS 78 06/28/2017 1031   BILITOT 0.8 06/28/2017 1031   GFRNONAA >90 08/14/2013 1028   GFRAA >90 08/14/2013 1028    CT ABDOMEN AND PELVIS WITH CONTRAST   TECHNIQUE: Multidetector CT imaging of  the abdomen and pelvis was performed using the standard protocol following bolus administration of intravenous contrast.   CONTRAST:  158mL ISOVUE-300 IOPAMIDOL (ISOVUE-300) INJECTION 61%   COMPARISON:  CT abdomen pelvis 01/04/2012.   FINDINGS: Lower chest: Normal heart size. Dependent atelectasis/scarring within the lower lobes bilaterally.   Hepatobiliary: Fatty deposition adjacent to the falciform ligament. Stable subcentimeter too small to characterize low-attenuation lesion medial left hepatic lobe (image 21; series 2). Gallbladder is unremarkable. No intrahepatic or extrahepatic biliary ductal dilatation.   Pancreas: Unremarkable   Spleen: Unremarkable   Adrenals/Urinary Tract: Normal adrenal glands. Kidneys enhance symmetrically with contrast.  Urinary bladder is unremarkable. No hydronephrosis.   Stomach/Bowel: No abnormal bowel wall thickening or evidence for bowel obstruction. No free fluid or free intraperitoneal air. Normal morphology of the stomach.   Vascular/Lymphatic: Normal caliber abdominal aorta. Peripheral calcified atherosclerotic plaque. No retroperitoneal lymphadenopathy.   Reproductive: Prostate unremarkable.   Other: None.   Musculoskeletal: Postsurgical changes proximal left femur. Spinal stimulator device. Lumbar spine degenerative changes.   IMPRESSION: 1. No acute process within the abdomen or pelvis.     Electronically Signed   By: Lovey Newcomer M.D.   On: 07/07/2017 15:00   CT CHEST WITHOUT CONTRAST   TECHNIQUE: Multidetector CT imaging of the chest was performed following the standard protocol without IV contrast.   COMPARISON:  Chest radiographs 06/28/2017. Chest CT 03/26/2013, 03/26/2014 and 10/11/2014. Abdominal CT 07/07/2017.   FINDINGS: Cardiovascular: Mild atherosclerosis of the aorta, great vessels and coronary arteries. There is borderline dilatation the central pulmonary arteries. The heart size is normal. There is no pericardial effusion.   Mediastinum/Nodes: There are no enlarged mediastinal, hilar or axillary lymph nodes.Hilar assessment is limited by the lack of intravenous contrast, although the hilar contours appear unchanged. The thyroid gland, trachea and esophagus demonstrate no significant findings.   Lungs/Pleura: There is no pleural effusion. Again demonstrated is severe centrilobular emphysema. 6 x 3 mm subpleural right middle lobe nodule on image 129/4 and 4 mm subpleural right lower lobe nodule on image 133/4 are stable, likely lymph nodes. 4 mm left lower lobe nodule on image 103/4 is stable. Questionable 4 mm cavitary lesion in the left lower lobe on image 113, possibly a dilated bronchus. Allowing for the thinner slice thickness on the current study, no  definite new or enlarging nodules.   Upper abdomen: The visualized upper abdomen appears stable without acute findings.   Musculoskeletal/Chest wall: There is no chest wall mass or suspicious osseous finding. Thoracic spinal stimulator again noted.   IMPRESSION: 1. No significant change in pulmonary nodules from 2016 allowing for technical differences. 2. Severe Emphysema (ICD10-J43.9). 3. Mild Aortic Atherosclerosis (ICD10-I70.0). 4. No acute findings.     Electronically Signed   By: Richardean Sale M.D.   On: 11/08/2017 14:28       Assessment & Plan:   68 yo male with PMH of gastritis and duodenitis, COPD, rheumatoid arthritis, who is seen in follow-up again to evaluate weight loss.  1. Weight Loss --no overt GI symptoms other than early fullness.  Appetite fluctuates and he certainly down 30 or 40 pounds but over multiple years.  In the last 12 months he is only down about 3 pounds.  I wonder if his COPD could be causing his weight loss.  I do think it is reasonable to perform a gastric emptying scan to evaluate for gastroparesis in the setting of nausea, early satiety and weight loss. --Gastric emptying scan, 4 hours --I am  in a stop omeprazole for now as PPI has not seem to help him in the past  2.  COPD --I have encouraged him to follow-up with pulmonary as it has been about a year.  3.  CRC screening --colonoscopy would be due October 2023  25 minutes spent with the patient today. Greater than 50% was spent in counseling and coordination of care with the patient

## 2018-08-31 NOTE — Assessment & Plan Note (Signed)
In the setting of rheumatoid arthritis.  Patient's last CT was October 2019 and was stable.

## 2018-09-11 ENCOUNTER — Telehealth: Payer: Self-pay | Admitting: Internal Medicine

## 2018-09-11 NOTE — Telephone Encounter (Signed)
Patient is calling to find out what time he has to arrive to his appt at [facility] on 8/06 and he wants to know if he has to have the COVID test done.

## 2018-09-11 NOTE — Telephone Encounter (Signed)
Patient apprehensive about needing COVID testing as he has heard "they stick it about up to your brain." I advised no need for COVID test prior to radiology study as long as asymptomatic. Patient also states that he found paperwork with time/date/location of gastric emptying scan but I have gone through this information again with him for good measure.

## 2018-09-14 ENCOUNTER — Other Ambulatory Visit: Payer: Self-pay

## 2018-09-14 ENCOUNTER — Ambulatory Visit (HOSPITAL_COMMUNITY)
Admission: RE | Admit: 2018-09-14 | Discharge: 2018-09-14 | Disposition: A | Discharge status: Home / Self Care | Payer: BC Managed Care – PPO | Source: Ambulatory Visit | Attending: Internal Medicine | Admitting: Internal Medicine

## 2018-09-14 DIAGNOSIS — R6881 Early satiety: Secondary | ICD-10-CM | POA: Insufficient documentation

## 2018-09-14 DIAGNOSIS — R110 Nausea: Secondary | ICD-10-CM | POA: Diagnosis not present

## 2018-09-14 DIAGNOSIS — R634 Abnormal weight loss: Secondary | ICD-10-CM | POA: Diagnosis not present

## 2018-09-14 MED ORDER — TECHNETIUM TC 99M SULFUR COLLOID FILTERED
2.1000 | Freq: Once | INTRAVENOUS | Status: AC | PRN
  Administered 2018-09-14: 2.1 via INTRADERMAL

## 2018-10-05 DIAGNOSIS — M0579 Rheumatoid arthritis with rheumatoid factor of multiple sites without organ or systems involvement: Secondary | ICD-10-CM | POA: Diagnosis not present

## 2018-11-02 DIAGNOSIS — M0579 Rheumatoid arthritis with rheumatoid factor of multiple sites without organ or systems involvement: Secondary | ICD-10-CM | POA: Diagnosis not present

## 2018-11-17 DIAGNOSIS — Z79899 Other long term (current) drug therapy: Secondary | ICD-10-CM | POA: Diagnosis not present

## 2018-11-17 DIAGNOSIS — G894 Chronic pain syndrome: Secondary | ICD-10-CM | POA: Diagnosis not present

## 2018-11-17 DIAGNOSIS — M150 Primary generalized (osteo)arthritis: Secondary | ICD-10-CM | POA: Diagnosis not present

## 2018-11-17 DIAGNOSIS — M0579 Rheumatoid arthritis with rheumatoid factor of multiple sites without organ or systems involvement: Secondary | ICD-10-CM | POA: Diagnosis not present

## 2018-11-30 DIAGNOSIS — M0579 Rheumatoid arthritis with rheumatoid factor of multiple sites without organ or systems involvement: Secondary | ICD-10-CM | POA: Diagnosis not present

## 2019-01-02 DIAGNOSIS — M0579 Rheumatoid arthritis with rheumatoid factor of multiple sites without organ or systems involvement: Secondary | ICD-10-CM | POA: Diagnosis not present

## 2019-01-25 DIAGNOSIS — G8929 Other chronic pain: Secondary | ICD-10-CM | POA: Diagnosis not present

## 2019-01-25 DIAGNOSIS — M545 Low back pain: Secondary | ICD-10-CM | POA: Diagnosis not present

## 2019-01-25 DIAGNOSIS — Z79899 Other long term (current) drug therapy: Secondary | ICD-10-CM | POA: Diagnosis not present

## 2019-01-26 DIAGNOSIS — Z720 Tobacco use: Secondary | ICD-10-CM | POA: Diagnosis not present

## 2019-01-26 DIAGNOSIS — S20219A Contusion of unspecified front wall of thorax, initial encounter: Secondary | ICD-10-CM | POA: Diagnosis not present

## 2019-01-26 DIAGNOSIS — Z681 Body mass index (BMI) 19 or less, adult: Secondary | ICD-10-CM | POA: Diagnosis not present

## 2019-01-30 DIAGNOSIS — M0579 Rheumatoid arthritis with rheumatoid factor of multiple sites without organ or systems involvement: Secondary | ICD-10-CM | POA: Diagnosis not present

## 2019-01-30 DIAGNOSIS — Z79899 Other long term (current) drug therapy: Secondary | ICD-10-CM | POA: Diagnosis not present

## 2019-02-27 DIAGNOSIS — M0579 Rheumatoid arthritis with rheumatoid factor of multiple sites without organ or systems involvement: Secondary | ICD-10-CM | POA: Diagnosis not present

## 2019-03-28 DIAGNOSIS — Z20828 Contact with and (suspected) exposure to other viral communicable diseases: Secondary | ICD-10-CM | POA: Diagnosis not present

## 2019-04-12 DIAGNOSIS — M0579 Rheumatoid arthritis with rheumatoid factor of multiple sites without organ or systems involvement: Secondary | ICD-10-CM | POA: Diagnosis not present

## 2019-04-27 ENCOUNTER — Ambulatory Visit: Payer: BC Managed Care – PPO | Admitting: Emergency Medicine

## 2019-04-30 ENCOUNTER — Other Ambulatory Visit: Payer: Self-pay

## 2019-04-30 ENCOUNTER — Encounter: Payer: Self-pay | Admitting: Pulmonary Disease

## 2019-04-30 ENCOUNTER — Telehealth: Payer: Self-pay | Admitting: Emergency Medicine

## 2019-04-30 ENCOUNTER — Ambulatory Visit (INDEPENDENT_AMBULATORY_CARE_PROVIDER_SITE_OTHER): Payer: BC Managed Care – PPO | Admitting: Pulmonary Disease

## 2019-04-30 ENCOUNTER — Ambulatory Visit (INDEPENDENT_AMBULATORY_CARE_PROVIDER_SITE_OTHER): Payer: BC Managed Care – PPO

## 2019-04-30 VITALS — BP 134/72 | HR 94 | Temp 97.2°F | Ht 70.0 in | Wt 138.6 lb

## 2019-04-30 DIAGNOSIS — Z79899 Other long term (current) drug therapy: Secondary | ICD-10-CM | POA: Diagnosis not present

## 2019-04-30 DIAGNOSIS — R0602 Shortness of breath: Secondary | ICD-10-CM

## 2019-04-30 DIAGNOSIS — Z0000 Encounter for general adult medical examination without abnormal findings: Secondary | ICD-10-CM

## 2019-04-30 DIAGNOSIS — F17200 Nicotine dependence, unspecified, uncomplicated: Secondary | ICD-10-CM

## 2019-04-30 DIAGNOSIS — J441 Chronic obstructive pulmonary disease with (acute) exacerbation: Secondary | ICD-10-CM

## 2019-04-30 DIAGNOSIS — M069 Rheumatoid arthritis, unspecified: Secondary | ICD-10-CM

## 2019-04-30 DIAGNOSIS — Z23 Encounter for immunization: Secondary | ICD-10-CM

## 2019-04-30 DIAGNOSIS — U071 COVID-19: Secondary | ICD-10-CM | POA: Insufficient documentation

## 2019-04-30 DIAGNOSIS — Z8616 Personal history of COVID-19: Secondary | ICD-10-CM

## 2019-04-30 MED ORDER — AZITHROMYCIN 250 MG PO TABS: ORAL_TABLET | ORAL | 0 refills | Status: DC

## 2019-04-30 MED ORDER — PREDNISONE 10 MG PO TABS: ORAL_TABLET | ORAL | 0 refills | Status: DC

## 2019-04-30 MED ORDER — ALBUTEROL SULFATE HFA 108 (90 BASE) MCG/ACT IN AERS: 2.0000 | INHALATION_SPRAY | Freq: Four times a day (QID) | RESPIRATORY_TRACT | 6 refills | Status: DC | PRN

## 2019-04-30 MED ORDER — STIOLTO RESPIMAT 2.5-2.5 MCG/ACT IN AERS: 2.0000 | INHALATION_SPRAY | Freq: Every day | RESPIRATORY_TRACT | 0 refills | Status: DC

## 2019-04-30 NOTE — Progress Notes (Signed)
@Patient  ID: Andrew [name], male    DOB: 1950/07/25, 69 y.o.   MRN: [id number]  Chief Complaint  Patient presents with  . Acute Visit    short of breath     Referring provider: Manon Hilding, MD  HPI:  69 year old male followed in our office for COPD, status post COVID-19 infection in February/2021  PMH: Hypothyroidism, rheumatoid arthritis Smoker/ Smoking History: Current smoker.  2 packs/day.  135-pack-year smoking history. Maintenance: Stiolto Respimat Pt of: Dr Lamonte Sakai  04/30/2019  - Visit   69 year old male presenting to office today as an acute visit.  Patient status post COVID-19 infection in February/2021.  Patient did not receive the monoclonal antibody infusion as he was asymptomatic.  Patient did not require hospitalization.  Patient was last seen in our office in July/2020 by [place] NP.  Patient last seen by Dr. Lamonte Sakai in July/2019.  At that time a CT chest was ordered.  This showed stability but severe centrilobular emphysema.  Patient was encouraged to stop smoking.  He is currently on methotrexate for rheumatoid arthritis.  Originally patient was supposed to complete a pulmonary function test at this time but this was deferred due to his recent hernia surgery.  Pulmonary function test will need to be repeated.  Breathing test were never repeated.  We will discuss this today.  Patient is also followed by rheumatology Dr. Trudie Reed.  He reports he receives an Actemra infusion every month.  Unfortunately patient also struggles with taking his methotrexate.  He reports that he often forgets to take it.  Last time he took it was 3 weeks ago.  We will discuss this today  Patient also struggles with follow-up with primary care regarding his thyroid medications.  He has not been taking these recently either.  He has not yet followed up with primary care.  Patient reporting that his oxygen levels dropped to 89% at home.  This prompted the patient to be scheduled for a visit with our  office.  Patient is status post Covid infection in February/2021.  Patient continues to smoke, 2 packs/day.  He cannot remember the last time he was on a maintenance inhaler.  He suspects this may have been in 2019 when he last saw Dr. Lamonte Sakai.  He is unsure if the maintenance inhaler helped him.  Patient with severe centrilobular emphysema on CT imaging in 2019.  Questionaires / Pulmonary Flowsheets:   MMRC: mMRC Dyspnea Scale mMRC Score  04/30/2019 3    Tests:   11/08/2017-CT chest without contrast-no interval change in pulmonary nodules from 2016 allowing for technical differences, severe emphysema, mild aortic arthrosclerosis  08/31/2017-pulmonary function test-no result, deferred due to recent hernia surgery  03/28/2019-SARS-CoV-2-positive  FENO:  No results found for: NITRICOXIDE  PFT: No flowsheet data found.  WALK:  No flowsheet data found.  Imaging: DG Chest 2 View  Result Date: 04/30/2019 CLINICAL DATA:  Short of breath and congestion, history of asthma EXAM: CHEST - 2 VIEW COMPARISON:  01/26/2019 FINDINGS: Frontal and lateral views of the chest demonstrates stable spinal cord stimulator. Cardiac silhouette is unremarkable. The lungs remain hyperinflated with background emphysema. Increased perihilar bronchovascular markings could reflect reactive airway disease or bronchitis. No airspace disease, effusion, or pneumothorax. No acute bony abnormalities. IMPRESSION: 1. Bilateral perihilar bronchovascular prominence consistent with reactive airway disease or bronchitis. 2. No acute airspace disease. 3. Stable background emphysema. Electronically Signed   By: Randa Ngo M.D.   On: 04/30/2019 16:01    Lab Results:  CBC    Component Value Date/Time   WBC 10.9 (H) 06/28/2017 1031   RBC 4.60 06/28/2017 1031   HGB 16.2 06/28/2017 1031   HCT 46.3 06/28/2017 1031   PLT 199.0 06/28/2017 1031   MCV 100.7 (H) 06/28/2017 1031   MCH 33.9 08/14/2013 1028   MCHC 34.9 06/28/2017 1031    RDW 13.9 06/28/2017 1031   LYMPHSABS 1.7 06/28/2017 1031   MONOABS 0.5 06/28/2017 1031   EOSABS 0.2 06/28/2017 1031   BASOSABS 0.1 06/28/2017 1031    BMET    Component Value Date/Time   NA 137 06/28/2017 1031   K 3.8 06/28/2017 1031   CL 99 06/28/2017 1031   CO2 32 06/28/2017 1031   GLUCOSE 105 (H) 06/28/2017 1031   BUN 22 06/28/2017 1031   CREATININE 0.65 06/28/2017 1031   CALCIUM 9.7 06/28/2017 1031   GFRNONAA >90 08/14/2013 1028   GFRAA >90 08/14/2013 1028    BNP No results found for: BNP  ProBNP    Component Value Date/Time   PROBNP 197.4 (H) 02/09/2013 1400    Specialty Problems      Pulmonary Problems   DYSPNEA    Qualifier: Diagnosis of  By: Lamonte Sakai MD, Rose Fillers       COPD    Qualifier: Diagnosis of  By: Lamonte Sakai MD, Rose Fillers       Acute bronchitis   COPD (chronic obstructive pulmonary disease) ([place])   CAP (community acquired pneumonia)   Pulmonary nodules   Acute pansinusitis      Allergies  Allergen Reactions  . Penicillins Nausea Only    REACTION: nausea Has patient had a PCN reaction causing immediate rash, facial/tongue/throat swelling, SOB or lightheadedness with hypotension: unknown Has patient had a PCN reaction causing severe rash involving mucus membranes or skin necrosis: unknown Has patient had a PCN reaction that required hospitalization: yes, was in hospital with pneumonia Has patient had a PCN reaction occurring within the last 10 years: no If all of the above answers are "NO", then may proceed with Cephalosporin use.  REACTION: nausea Has patient had a PCN reaction causing immediate rash, facial/tongue/throat swelling, SOB or lightheadedness with hypotension: unknown Has patient had a PCN reaction causing severe rash involving mucus membranes or skin necrosis: unknown Has patient had a PCN reaction that required hospitalization: yes, was in hospital with pneumonia Has patient had a PCN reaction occurring within the last 10  years: no If all of the above answers are "NO", then may proceed with Cephalosporin use. REACTION: nausea Has patient had a PCN reaction causing immediate rash, facial/tongue/throat swelling, SOB or lightheadedness with hypotension: unknown Has patient had a PCN reaction causing severe rash involving mucus membranes or skin necrosis: unknown Has patient had a PCN reaction that required hospitalization: yes, was in hospital with pneumonia Has patient had a PCN reaction occurring within the last 10 years: no If all of the above answers are "NO", then may proceed with Cephalosporin use.    Immunization History  Administered Date(s) Administered  . Influenza Split 11/09/2010, 10/18/2011  . Influenza, High Dose Seasonal PF 02/08/2017  . Influenza,inj,Quad PF,6+ Mos 10/29/2013, 03/31/2015, 11/06/2015  . Influenza,inj,quad, With Preservative 02/09/2016  . Influenza-Unspecified 11/08/2012  . Pneumococcal Conjugate-13 08/31/2016  . Pneumococcal Polysaccharide-23 11/08/2009, 04/30/2019  . Zoster 02/08/2009    Past Medical History:  Diagnosis Date  . Aneurysm ([place])    POSTERIOR BRAIN - NO FOLLOW UP X 40 YRS  . Asthma    "when I was real  young"  . Bruises easily   . Chronic back pain   . Chronic bronchitis ([place])    "get it anytime I catch a cold" (02/09/2013)  . Collagen vascular disease ([place])   . COPD (chronic obstructive pulmonary disease) ([facility])    no O2 use at home per pt.,uses breathing [place] prn  . Daily headache    "here lately" (02/09/2013)  . Difficulty urinating   . Emphysema   . Enlarged prostate   . Exertional shortness of breath    "just walking from room to room sometimes" (02/09/2013)  . Fibromyalgia   . Gastritis 2019   chronic inactive  . H/O hiatal hernia   . Hepatitis    "I've been told I had hepatitis; had yellow jaundice when I wa young" (02/09/2013)  . Hypothyroidism   . Inguinal hernia   . Insomnia   . Internal hemorrhoids   . Osteoarthritis   . Pneumonia 1970's;  2000's   "I've had it twice" (02/09/2013)  . PONV (postoperative nausea and vomiting)   . Rheumatoid arteritis ([place])   . Thyroid disease   . Tick bite of abdomen 07/10/15   completed doxycycline Rx. had rash from abdomen and down anterior thighs    Tobacco History: Social History   Tobacco Use  Smoking Status Current Every Day Smoker  . Packs/day: 3.00  . Years: 45.00  . Pack years: 135.00  . Types: Cigarettes, Cigars  . Start date: 63  . Last attempt to quit: 02/08/1997  . Years since quitting: 22.2  Smokeless Tobacco Former Systems developer  . Types: Chew  Tobacco Comment   started back up in 2020 - 2packs per day 04/10/19   Ready to quit: No Counseling given: Yes Comment: started back up in 2020 - 2packs per day 04/10/19  Smoking assessment and cessation counseling  Patient currently smoking: 2 packs/day I have advised the patient to quit/stop smoking as soon as possible due to high risk for multiple medical problems.  It will also be very difficult for [place] to manage patient's  respiratory symptoms and status if we continue to expose her lungs to a known irritant.  We do not advise e-cigarettes as a form of stopping smoking.  Patient is not willing to quit smoking.  He reports he will think about it.  He reports that he will quit on "his terms".  Offered clinical pharmacy team visit for the patient.  He declined.  Offered smoking cessation help, patient declined.  I have advised the patient that we can assist and have options of nicotine replacement therapy, provided smoking cessation education today, provided smoking cessation counseling, and provided cessation resources.  Follow-up next office visit office visit for assessment of smoking cessation.    Smoking cessation counseling advised for: 12 min    Outpatient Encounter Medications as of 04/30/2019  Medication Sig  . morphine (MS CONTIN) 30 MG 12 hr tablet Take 30 mg by mouth every 12 (twelve) hours.  [place] Kitchen oxyCODONE-acetaminophen  (PERCOCET) 10-325 MG per tablet Take 1 tablet by mouth 3 (three) times daily as needed for pain. Pain  . zolpidem (AMBIEN) 10 MG tablet Take 10 mg by mouth at bedtime.  Marland Kitchen albuterol (PROVENTIL) (2.5 MG/3ML) 0.083% nebulizer solution Take 2.5 mg by nebulization every 6 (six) hours as needed for wheezing or shortness of breath.  Mar[place] Kitchen albuterol (VENTOLIN HFA) 108 (90 Base) MCG/ACT inhaler Inhale 2 puffs into the lungs every 6 (six) hours as needed for wheezing or shortness of breath. Reported on  03/31/2015  . azithromycin (ZITHROMAX) 250 MG tablet 500mg  (two tablets) today, then 250mg  (1 tablet) for the next 4 days  . folic acid (FOLVITE) 1 MG tablet Take 1 mg by mouth daily.  Mar[place] Kitchen levothyroxine (SYNTHROID, LEVOTHROID) 75 MCG tablet Take 75 mcg by mouth daily before breakfast.  . methotrexate (RHEUMATREX) 2.5 MG tablet Take 25 mg by mouth once a week. 10 pills every Friday  . Tiotropium Bromide-Olodaterol (STIOLTO RESPIMAT) 2.5-2.5 MCG/ACT AERS Inhale 2 puffs into the lungs daily. (Patient not taking: Reported on 04/30/2019)  . Tiotropium Bromide-Olodaterol (STIOLTO RESPIMAT) 2.5-2.5 MCG/ACT AERS Inhale 2 puffs into the lungs daily.  . [DISCONTINUED] albuterol (PROVENTIL HFA;VENTOLIN HFA) 108 (90 Base) MCG/ACT inhaler Inhale 2 puffs into the lungs every 6 (six) hours as needed for wheezing or shortness of breath. Reported on 03/31/2015 (Patient not taking: Reported on 04/30/2019)  . [DISCONTINUED] doxycycline (VIBRA-TABS) 100 MG tablet Take 1 tablet (100 mg total) by mouth 2 (two) times daily.  . [DISCONTINUED] meloxicam (MOBIC) 15 MG tablet Take 1 tablet by mouth daily.  . [DISCONTINUED] predniSONE (DELTASONE) 10 MG tablet 4 tabs for 3 days, then 3 tabs for 3 days, 2 tabs for 3 days, then 1 tab for 3 days, then stop  . [DISCONTINUED] predniSONE (DELTASONE) 10 MG tablet 4 tabs for 2 days, then 3 tabs for 2 days, 2 tabs for 2 days, then 1 tab for 2 days, then stop   Facility-Administered Encounter Medications as of  04/30/2019  Medication  . 0.9 %  sodium chloride infusion     Review of Systems  Review of Systems  Constitutional: Positive for fatigue and unexpected weight change. Negative for activity change, chills and fever.  HENT: Positive for congestion and postnasal drip. Negative for rhinorrhea, sinus pressure, sinus pain and sore throat.   Eyes: Negative.   Respiratory: Positive for cough, shortness of breath and wheezing.   Cardiovascular: Negative for chest pain and palpitations.  Gastrointestinal: Negative for constipation, diarrhea, nausea and vomiting.  Endocrine: Negative.   Genitourinary: Negative.   Musculoskeletal: Negative.   Skin: Negative.   Neurological: Negative for dizziness and headaches.  Psychiatric/Behavioral: Negative.  Negative for dysphoric mood. The patient is not nervous/anxious.   All other systems reviewed and are negative.    Physical Exam  BP 134/72 (BP Location: Left Arm, Cuff Size: Normal)   Pulse 94   Temp (!) 97.2 F (36.2 C) (Temporal)   Ht 5\' 10"  (1.778 m)   Wt 138 lb 9.6 oz (62.9 kg)   SpO2 91%   BMI 19.89 kg/m   Wt Readings from Last 5 Encounters:  04/30/19 138 lb 9.6 oz (62.9 kg)  08/30/18 137 lb 6.4 oz (62.3 kg)  08/29/18 139 lb (63 kg)  08/31/17 142 lb 12.8 oz (64.8 kg)  07/19/17 144 lb (65.3 kg)    BMI Readings from Last 5 Encounters:  04/30/19 19.89 kg/m  08/30/18 19.71 kg/m  08/29/18 19.94 kg/m  08/31/17 20.49 kg/m  07/19/17 20.66 kg/m   Physical Exam Vitals and nursing note reviewed.  Constitutional:      General: He is not in acute distress.    Appearance: Normal appearance. He is normal weight.     Comments: Thin frail elderly male  HENT:     Head: Normocephalic and atraumatic.     Right Ear: Hearing, tympanic membrane, ear canal and external ear normal. There is no impacted cerumen.     Left Ear: Hearing, tympanic membrane, ear canal and external ear normal.  There is impacted cerumen (Partially about 50%).      Nose: Congestion and rhinorrhea present. No mucosal edema.     Right Turbinates: Not enlarged.     Left Turbinates: Not enlarged.     Mouth/Throat:     Mouth: Mucous membranes are dry.     Pharynx: Oropharynx is clear. No oropharyngeal exudate.  Eyes:     Pupils: Pupils are equal, round, and reactive to light.  Cardiovascular:     Rate and Rhythm: Normal rate and regular rhythm.     Pulses: Normal pulses.     Heart sounds: Normal heart sounds. No murmur.  Pulmonary:     Effort: Pulmonary effort is normal.     Breath sounds: No decreased breath sounds, wheezing or rales.     Comments: Diminished throughout exam, likely due to patient's severe centrilobular emphysema Abdominal:     General: Bowel sounds are normal. There is no distension.     Palpations: Abdomen is soft.     Tenderness: There is no abdominal tenderness.  Musculoskeletal:     Cervical back: Normal range of motion.     Right lower leg: No edema.     Left lower leg: No edema.  Lymphadenopathy:     Cervical: No cervical adenopathy.  Skin:    General: Skin is warm and dry.     Capillary Refill: Capillary refill takes less than 2 seconds.     Findings: No erythema or rash.  Neurological:     General: No focal deficit present.     Mental Status: He is alert and oriented to person, place, and time.     Motor: No weakness.     Coordination: Coordination normal.     Gait: Gait is intact. Gait normal.  Psychiatric:        Mood and Affect: Mood normal.        Behavior: Behavior normal. Behavior is cooperative.        Thought Content: Thought content normal.        Judgment: Judgment normal.     Assessment & Plan:   Rheumatoid arthritis ([place]) Followed by Dr. Trudie Reed Patient admits that he struggles with adherence to his methotrexate which should be 25 mg weekly He reports that the last time he took this was 3 weeks ago. Patient also reporting that he is receiving monthly Actemra infusions  Plan: Emphasized  importance the patient is adherent to his medications as prescribed We will route note to Dr. Trudie Reed as FYI Patient reports that he will resume methotrexate weekly Emphasized importance of taking folic acid daily We will order pulmonary function testing May need to consider CT imaging after treating the patient for an acute bronchitis  COPD (chronic obstructive pulmonary disease) Current smoker Patient reporting that he has had 30 pound weight loss, June/2019 weight was 144 pounds, weight today is 138lbs Nonadherent with inhalers He cannot remember the last time he had Stiolto Respimat Walk today in office:  Plan: Z-Pak today Prednisone taper today Resume Stiolto Respimat Refill rescue inhaler Emphasized repeatedly today the patient needs to stop smoking Offered clinical pharmacy team visit, patient declined 2 to 4-week follow-up in office or virtual to further evaluate  Current smoker Current smoker Smoking 2 packs/day Previously stop smoking for a significant period of time after his brother died from throat cancer Significant smoking history prior to stopping previously Stop cold [place]  Discussion: Patient is not interested in discussing smoking cessation any further at this point time.  He is aware of the resources that we could provide to him such as a clinical pharmacy team visit work smoking cessation aids.  He reports that he will follow-up with [place] if and when he is ready.  He emphasized multiple times that he will stop smoking on "his time".  Plan: Emphasized need to stop smoking Offered resources such as clinical pharmacy team visit, declined Patient does need Pneumovax 23 -can receive today    Return in about 16 days (around 05/16/2019), or if symptoms worsen or fail to improve, for Follow up with Dr. Lamonte Sakai.   Lauraine Rinne, NP 04/30/2019   This appointment required 44 minutes of patient care (this includes precharting, chart review, review of results,  face-to-face care, etc.).

## 2019-04-30 NOTE — Assessment & Plan Note (Signed)
Followed by Dr. Trudie Reed Patient admits that he struggles with adherence to his methotrexate which should be 25 mg weekly He reports that the last time he took this was 3 weeks ago. Patient also reporting that he is receiving monthly Actemra infusions  Plan: Emphasized importance the patient is adherent to his medications as prescribed We will route note to Dr. Trudie Reed as FYI Patient reports that he will resume methotrexate weekly Emphasized importance of taking folic acid daily We will order pulmonary function testing May need to consider CT imaging after treating the patient for an acute bronchitis

## 2019-04-30 NOTE — Assessment & Plan Note (Signed)
Current smoker Smoking 2 packs/day Previously stop smoking for a significant period of time after his brother died from throat cancer Significant smoking history prior to stopping previously Stop cold [place]  Discussion: Patient is not interested in discussing smoking cessation any further at this point time.  He is aware of the resources that we could provide to him such as a clinical pharmacy team visit work smoking cessation aids.  He reports that he will follow-up with [place] if and when he is ready.  He emphasized multiple times that he will stop smoking on "his time".  Plan: Emphasized need to stop smoking Offered resources such as clinical pharmacy team visit, declined Patient does need Pneumovax 23 -can receive today

## 2019-04-30 NOTE — Assessment & Plan Note (Signed)
Plan: We will obtain pulmonary function testing Chest x-ray today

## 2019-04-30 NOTE — Addendum Note (Signed)
Addended by: Amado Coe on: 04/30/2019 02:10 PM   Modules accepted: Orders

## 2019-04-30 NOTE — Telephone Encounter (Signed)
ATC patient to let him know he needs to come in for a chest xray prior to visit, patient did not answer left message to call office back

## 2019-04-30 NOTE — Assessment & Plan Note (Signed)
Emphasized importance of the patient take his medications as prescribed  Discussion: Offered home health referral to help with medication set up.  Patient reports that him and his spouse can handle this.  He reports that he will become more adherent.  Plan: Take medications as outlined.

## 2019-04-30 NOTE — Progress Notes (Signed)
Discussed results with patient in office.  Nothing further is needed at this time.  Lyndsie Wallman FNP  

## 2019-04-30 NOTE — Telephone Encounter (Signed)
Called and spoke to pt. Pt c/o increase in SOB, chest congestion, wheezing, cough with little mucus production x < 1 week. Pt denies CP/tightness and f/c/s. Pt states he has started smoking again about a year ago. When speaking with the pt's wife, she states the pt is now smoking about 2 PPD. Pt's wife is requesting an in-office visit. Pt currently has a pending OV on 05/16/2019 with RB but pt needing sooner appt.   Dr. Lamonte Sakai please advise if pt can come in for an in-office visit with APP given acute s/s and office protocol with COVID. Pt last seen in 08/2018.

## 2019-04-30 NOTE — Telephone Encounter (Signed)
Spoke with wife, aware of recs.  Pt scheduled for this afternoon at 4:00.  Nothing further needed at this time- will close encounter.

## 2019-04-30 NOTE — Telephone Encounter (Signed)
Yes Ok with me for him to be seen sooner by APP

## 2019-04-30 NOTE — Patient Instructions (Addendum)
You were seen today by Lauraine Rinne, NP  for:   [facility] meeting you today in office.  Please do not hesitate to give [place] a call if you have additional questions or concerns.  We will order a breathing test today this is the breathing test that we were not able to complete in 2019.  We gave you a pneumonia shot today.  I will treat you with a course of antibiotics.  We will plan on a repeat x-ray in 6 to 8 weeks.  Keep your follow-up appointment with Dr. Lamonte Sakai.  It is very important that you take your medications as prescribed.  Please follow-up with Dr. Trudie Reed with any additional questions regarding your methotrexate.  Take care and stay safe,  Reanna Scoggin  1. Chronic obstructive pulmonary disease with acute exacerbation (HCC)  - Pulmonary function test; Future - azithromycin (ZITHROMAX) 250 MG tablet; 500mg  (two tablets) today, then 250mg  (1 tablet) for the next 4 days  Dispense: 6 tablet; Refill: 0  Stiolto Respimat inhaler >>>2 puffs daily >>>Take this no matter what >>>This is not a rescue inhaler  Only use your albuterol as a rescue medication to be used if you can't catch your breath by resting or doing a relaxed purse lip breathing pattern.  - The less you use it, the better it will work when you need it. - Ok to use up to 2 puffs  every 4 hours if you must but call for immediate appointment if use goes up over your usual need - Don't leave home without it !!  (think of it like the spare tire for your car)   Note your daily symptoms > remember "red flags" for COPD:   >>>Increase in cough >>>increase in sputum production >>>increase in shortness of breath or activity  intolerance.   If you notice these symptoms, please call the office to be seen.    2. History of COVID-19  - Pulmonary function test; Future  3. Healthcare maintenance  Pneumovax 23 today  4. Medication management  Please make sure that you are taking your medications as prescribed  5. Rheumatoid arthritis,  involving unspecified site, unspecified whether rheumatoid factor present ([place])  Please follow-up with Dr. Trudie Reed regarding your medications  It is very important that you continue taking methotrexate 25 mg weekly  Continue folic acid daily  Continue Actemra infusions monthly  6. Current smoker  Please let us know if you become interested in stopping smoking.  We are more than happy to help you  We recommend that you stop smoking.  >>>You need to set a quit date >>>If you have friends or family who smoke, let them know you are trying to quit and not to smoke around you or in your living environment  Smoking Cessation Resources:  1 800 QUIT NOW  >>> Patient to call this resource and utilize it to help support her quit smoking >>> Keep up your hard work with stopping smoking  You can also contact the [facility] >>>For smoking cessation classes call [phone]  We do not recommend using e-cigarettes as a form of stopping smoking  You can sign up for smoking cessation support texts and information:  >>>https://smokefree.gov/smokefreetxt     We recommend today:  Orders Placed This Encounter  Procedures  . Pneumococcal polysaccharide vaccine 23-valent greater than or equal to 2yo subcutaneous/IM  . Pulmonary function test    Standing Status:   Future    Standing Expiration Date:   04/29/2020  Order Specific Question:   Where should this test be performed?    Answer:   [facility]    Order Specific Question:   Full PFT: includes the following: basic spirometry, spirometry pre & post bronchodilator, diffusion capacity (DLCO), lung volumes    Answer:   Full PFT   Orders Placed This Encounter  Procedures  . Pneumococcal polysaccharide vaccine 23-valent greater than or equal to 2yo subcutaneous/IM  . Pulmonary function test   Meds ordered this encounter  Medications  . Tiotropium Bromide-Olodaterol (STIOLTO RESPIMAT) 2.5-2.5 MCG/ACT AERS    Sig:  Inhale 2 puffs into the lungs daily.    Dispense:  4 g    Refill:  0    Order Specific Question:   Lot Number?    Answer:   [id number]    Order Specific Question:   Expiration Date?    Answer:   01/08/2021    Order Specific Question:   Quantity    Answer:   2  . albuterol (VENTOLIN HFA) 108 (90 Base) MCG/ACT inhaler    Sig: Inhale 2 puffs into the lungs every 6 (six) hours as needed for wheezing or shortness of breath. Reported on 03/31/2015    Dispense:  8 g    Refill:  6  . azithromycin (ZITHROMAX) 250 MG tablet    Sig: 500mg  (two tablets) today, then 250mg  (1 tablet) for the next 4 days    Dispense:  6 tablet    Refill:  0  . DISCONTD: predniSONE (DELTASONE) 10 MG tablet    Sig: 4 tabs for 2 days, then 3 tabs for 2 days, 2 tabs for 2 days, then 1 tab for 2 days, then stop    Dispense:  20 tablet    Refill:  0    Follow Up:    Return in about 16 days (around 05/16/2019), or if symptoms worsen or fail to improve, for Follow up with Dr. Lamonte Sakai.   Please do your part to reduce the spread of COVID-19:      Reduce your risk of any infection  and COVID19 by using the similar precautions used for avoiding the common cold or flu:  Marland Kitchen Wash your hands often with soap and warm water for at least 20 seconds.  If soap and water are not readily available, use an alcohol-based hand sanitizer with at least 60% alcohol.  . If coughing or sneezing, cover your mouth and nose by coughing or sneezing into the elbow areas of your shirt or coat, into a tissue or into your sleeve (not your hands). Langley Gauss A MASK when in public  . Avoid shaking hands with others and consider head nods or verbal greetings only. . Avoid touching your eyes, nose, or mouth with unwashed hands.  . Avoid close contact with people who are sick. . Avoid places or events with large numbers of people in one location, like concerts or sporting events. . If you have some symptoms but not all symptoms, continue to monitor at home and  seek medical attention if your symptoms worsen. . If you are having a medical emergency, call 911.   [place] / e-Visit: [email or web address]         [facility]: [facility]: [id number]                   [facility]: [id number]     It is flu season:   >>>  Best ways to protect herself from the flu: Receive the yearly flu vaccine, practice good hand hygiene washing with soap and also using hand sanitizer when available, eat a nutritious meals, get adequate rest, hydrate appropriately   Please contact the office if your symptoms worsen or you have concerns that you are not improving.   Thank you for choosing [facility] for your healthcare, and for allowing [place] to partner with you on your healthcare journey. I am thankful to be able to provide care to you today.   Wyn Quaker FNP-C    Health Risks of Smoking Smoking cigarettes is very bad for your health. Tobacco smoke has over 200 known poisons in it. It contains the poisonous gases nitrogen oxide and carbon monoxide. There are over 60 chemicals in tobacco smoke that cause cancer. Smoking is difficult to quit because a chemical in tobacco, called nicotine, causes addiction or dependence. When you smoke and inhale, nicotine is absorbed rapidly into the bloodstream through your lungs. Both inhaled and non-inhaled nicotine may be addictive. What are the risks of cigarette smoke? Cigarette smokers have an increased risk of many serious medical problems, including:  Lung cancer.  Lung disease, such as pneumonia, bronchitis, and emphysema.  Chest pain (angina) and heart attack because the heart is not getting enough oxygen.  Heart disease and peripheral blood vessel disease.  High blood pressure (hypertension).  Stroke.  Oral cancer, including cancer of the lip,  mouth, or voice box.  Bladder cancer.  Pancreatic cancer.  Cervical cancer.  Pregnancy complications, including premature birth.  Stillbirths and smaller newborn babies, birth defects, and genetic damage to sperm.  Early menopause.  Lower estrogen level for women.  Infertility.  Facial wrinkles.  Blindness.  Increased risk of broken bones (fractures).  Senile dementia.  Stomach ulcers and internal bleeding.  Delayed wound healing and increased risk of complications during surgery.  Even smoking lightly shortens your life expectancy by several years. Because of secondhand smoke exposure, children of smokers have an increased risk of the following:  Sudden infant death syndrome (SIDS).  Respiratory infections.  Lung cancer.  Heart disease.  Ear infections. What are the benefits of quitting? There are many health benefits of quitting smoking. Here are some of them:  Within days of quitting smoking, your risk of having a heart attack decreases, your blood flow improves, and your lung capacity improves. Blood pressure, pulse rate, and breathing patterns start returning to normal soon after quitting.  Within months, your lungs may clear up completely.  Quitting for 10 years reduces your risk of developing lung cancer and heart disease to almost that of a nonsmoker.  People who quit may see an improvement in their overall quality of life. How do I quit smoking?     Smoking is an addiction with both physical and psychological effects, and longtime habits can be hard to change. Your health care provider can recommend:  Programs and community resources, which may include group support, education, or talk therapy.  Prescription medicines to help reduce cravings.  Nicotine replacement products, such as patches, gum, and nasal sprays. Use these products only as directed. Do not replace cigarette smoking with electronic cigarettes, which are commonly called  e-cigarettes. The safety of e-cigarettes is not known, and some may contain harmful chemicals.  A combination of two or more of these methods. Where to find more information  American Lung Association: www.lung.org  American Cancer Society: www.cancer.org Summary  Smoking cigarettes is very bad for  your health. Cigarette smokers have an increased risk of many serious medical problems, including several cancers, heart disease, and stroke.  Smoking is an addiction with both physical and psychological effects, and longtime habits can be hard to change.  By stopping right away, you can greatly reduce the risk of medical problems for you and your family.  To help you quit smoking, your health care provider can recommend programs, community resources, prescription medicines, and nicotine replacement products such as patches, gum, and nasal sprays. This information is not intended to replace advice given to you by your health care provider. Make sure you discuss any questions you have with your health care provider. Document Revised: 04/28/2017 Document Reviewed: 01/30/2016 Elsevier Patient Education  [place].   Pneumococcal Vaccine, Polyvalent solution for injection What is this medicine? PNEUMOCOCCAL VACCINE, POLYVALENT (NEU mo KOK al vak SEEN, pol ee VEY luhnt) is a vaccine to prevent pneumococcus bacteria infection. These bacteria are a major cause of ear infections, Strep throat infections, and serious pneumonia, meningitis, or blood infections worldwide. These vaccines help the body to produce antibodies (protective substances) that help your body defend against these bacteria. This vaccine is recommended for people 57 years of age and older with health problems. It is also recommended for all adults over 98 years old. This vaccine will not treat an infection. This medicine may be used for other purposes; ask your health care provider or pharmacist if you have questions. COMMON  BRAND NAME(S): Pneumovax 23 What should I tell my health care provider before I take this medicine? They need to know if you have any of these conditions:  bleeding problems  bone marrow or organ transplant  cancer, Hodgkin's disease  fever  infection  immune system problems  low platelet count in the blood  seizures  an unusual or allergic reaction to pneumococcal vaccine, diphtheria toxoid, other vaccines, latex, other medicines, foods, dyes, or preservatives  pregnant or trying to get pregnant  breast-feeding How should I use this medicine? This vaccine is for injection into a muscle or under the skin. It is given by a health care professional. A copy of Vaccine Information Statements will be given before each vaccination. Read this sheet carefully each time. The sheet may change frequently. Talk to your pediatrician regarding the use of this medicine in children. While this drug may be prescribed for children as young as 68 years of age for selected conditions, precautions do apply. Overdosage: If you think you have taken too much of this medicine contact a poison control center or emergency room at once. NOTE: This medicine is only for you. Do not share this medicine with others. What if I miss a dose? It is important not to miss your dose. Call your doctor or health care professional if you are unable to keep an appointment. What may interact with this medicine?  medicines for cancer chemotherapy  medicines that suppress your immune function  medicines that treat or prevent blood clots like warfarin, enoxaparin, and dalteparin  steroid medicines like prednisone or cortisone This list may not describe all possible interactions. Give your health care provider a list of all the medicines, herbs, non-prescription drugs, or dietary supplements you use. Also tell them if you smoke, drink alcohol, or use illegal drugs. Some items may interact with your medicine. What should I  watch for while using this medicine? Mild fever and pain should go away in 3 days or less. Report any unusual symptoms to your doctor  or health care professional. What side effects may I notice from receiving this medicine? Side effects that you should report to your doctor or health care professional as soon as possible:  allergic reactions like skin rash, itching or hives, swelling of the face, lips, or tongue  breathing problems  confused  fever over 102 degrees F  pain, tingling, numbness in the hands or feet  seizures  unusual bleeding or bruising  unusual muscle weakness Side effects that usually do not require medical attention (report to your doctor or health care professional if they continue or are bothersome):  aches and pains  diarrhea  fever of 102 degrees F or less  headache  irritable  loss of appetite  pain, tender at site where injected  trouble sleeping This list may not describe all possible side effects. Call your doctor for medical advice about side effects. You may report side effects to FDA at 1-800-FDA-1088. Where should I keep my medicine? This does not apply. This vaccine is given in a clinic, pharmacy, doctor's office, or other health care setting and will not be stored at home. NOTE: This sheet is a summary. It may not cover all possible information. If you have questions about this medicine, talk to your doctor, pharmacist, or health care provider.  2020 Elsevier/Gold Standard (2007-09-01 14:32:37)

## 2019-04-30 NOTE — Assessment & Plan Note (Signed)
Plan: Pneumovax 23 today 

## 2019-04-30 NOTE — Assessment & Plan Note (Addendum)
Current smoker Patient reporting that he has had 30 pound weight loss, June/2019 weight was 144 pounds, weight today is 138lbs Nonadherent with inhalers He cannot remember the last time he had Stiolto Respimat Walk today in office:  Plan: Z-Pak today Prednisone taper today Resume Stiolto Respimat Refill rescue inhaler Emphasized repeatedly today the patient needs to stop smoking Offered clinical pharmacy team visit, patient declined 2 to 4-week follow-up in office or virtual to further evaluate We will order PFT

## 2019-05-14 DIAGNOSIS — M0579 Rheumatoid arthritis with rheumatoid factor of multiple sites without organ or systems involvement: Secondary | ICD-10-CM | POA: Diagnosis not present

## 2019-05-16 ENCOUNTER — Encounter: Payer: Self-pay | Admitting: Emergency Medicine

## 2019-05-16 ENCOUNTER — Ambulatory Visit (INDEPENDENT_AMBULATORY_CARE_PROVIDER_SITE_OTHER): Payer: BC Managed Care – PPO | Admitting: Emergency Medicine

## 2019-05-16 ENCOUNTER — Ambulatory Visit: Payer: Medicare Other | Admitting: Emergency Medicine

## 2019-05-16 ENCOUNTER — Other Ambulatory Visit: Payer: Self-pay

## 2019-05-16 VITALS — BP 118/76 | HR 68 | Ht 70.0 in | Wt 143.0 lb

## 2019-05-16 DIAGNOSIS — R918 Other nonspecific abnormal finding of lung field: Secondary | ICD-10-CM

## 2019-05-16 DIAGNOSIS — M069 Rheumatoid arthritis, unspecified: Secondary | ICD-10-CM

## 2019-05-16 DIAGNOSIS — J449 Chronic obstructive pulmonary disease, unspecified: Secondary | ICD-10-CM | POA: Diagnosis not present

## 2019-05-16 NOTE — Assessment & Plan Note (Signed)
Presumed severe disease.  He is a poor history giver, cannot tell me whether he benefited from treatment recently for his acute exacerbation.  Also poorly compliant with his medication.  I reviewed his inhaled medicines with him and underscore the importance of using them reliably.  Also discussed smoking cessation with him briefly.  He is not motivated to stop at this time.

## 2019-05-16 NOTE — Patient Instructions (Signed)
Please continue Stiolto 2 puffs once daily. Keep your albuterol available to use 2 puffs if needed for shortness of breath, chest tightness, wheezing. We will perform a CT scan of your chest without contrast to follow your pulmonary nodules. Most important thing you can do for your health right now is to stop smoking.  The for step is to work on cutting down.  Try to decrease the number of cigarettes to smoke every day by our next visit. Follow with Dr. Lamonte Sakai next available after your CT scan is done so that we can review the results together.

## 2019-05-16 NOTE — Assessment & Plan Note (Signed)
Suspect is more nodular disease is related to rheumatoid.  He had stable nodules between 2016 and 2019.  No follow-up since.  I am concerned given his weight loss that he is high risk for malignancy.  We will repeat a CT chest to compare with priors ensure no evolving nodular disease.

## 2019-05-16 NOTE — Assessment & Plan Note (Signed)
He has had poor compliance with the methotrexate, does get Actemra.  Following with rheumatology.

## 2019-05-16 NOTE — Progress Notes (Signed)
History of Present Illness:   ROV 08/31/17 --Andrew [name] is 1, has a history of rheumatoid arthritis treated with methotrexate and also biologic therapy.  He also has COPD and history of stable pulmonary nodules followed on CT chest.  Patient was seen in late May 2019 in our office, noted that he had restarted smoking and had symptoms consistent with a possible acute exacerbation COPD.  He was treated with prednisone, antibiotics.  He was asked to restart Stiolto. He had a L inguinal hernia repair last week, so he wasn't able to do PFT today. He isn't sure that the stiolto has helped any. His exertional tolerance may be a bit down. He feels back to baseline. Very rarely needs albuterol. Doesn;t have a flare every year. He is smoking about 1 pk/day.   His last CT scan of the chest was 10/11/2014.  He had a CT abdomen 07/07/2017 that I reviewed.  This shows some dependent atelectasis on lung windows but no significant nodules or other lesions.  ROV 05/16/19 --follow-up visit for 69 year old heavy smoker with COPD.  Continues to smoke 2 packs a day.  He also has rheumatoid arthritis on Actemra and methotrexate (questionable compliance).  We have followed stable associated micronodular disease.  He saw Andrew [name] 3/22 in follow-up.  Unfortunately he had COVID-19 in February.  He is still been having difficulty.  More dyspnea, weight loss.  He has not been taking his Stiolto reliably.  He was restarted on this at that visit.  Also treated with prednisone and azithromycin for possible acute exacerbation and bronchitis.  His last CT chest was 11/08/2017. He has a hard time describing how he felt last time, or whether he feels any better now. He still misses his stiolto some days - thinks it helps him some. He uses albuterol 1-2x a day.   CXR 04/30/19 reviewed, no mass or infiltrates seen.      Vitals:   05/16/19 0952  BP: 118/76  Pulse: 68  SpO2: 98%  Weight: 143 lb (64.9 kg)  Height: 5\' 10"  (1.778 m)   Gen:  Pleasant, very thin, in no distress, blunted affect  ENT: No lesions,  mouth clear,  oropharynx clear, no postnasal drip  Neck: No JVD, no stridor  Lungs: No use of accessory muscles, right basilar inspiratory squeak, distant, no wheezes or crackles  Cardiovascular: RRR, heart sounds normal, no murmur or gallops, no peripheral edema  Musculoskeletal: No deformities, no cyanosis or clubbing  Neuro: alert, very poor memory, poor history giver.  Skin: Warm, no lesions or rashes    COPD (chronic obstructive pulmonary disease) Presumed severe disease.  He is a poor history giver, cannot tell me whether he benefited from treatment recently for his acute exacerbation.  Also poorly compliant with his medication.  I reviewed his inhaled medicines with him and underscore the importance of using them reliably.  Also discussed smoking cessation with him briefly.  He is not motivated to stop at this time.  Rheumatoid arthritis ([place]) He has had poor compliance with the methotrexate, does get Actemra.  Following with rheumatology.  Pulmonary nodules Suspect is more nodular disease is related to rheumatoid.  He had stable nodules between 2016 and 2019.  No follow-up since.  I am concerned given his weight loss that he is high risk for malignancy.  We will repeat a CT chest to compare with priors ensure no evolving nodular disease.  [name], MD, PhD 05/16/2019, 10:18 AM [facility] [phone]  or if no answer [phone]

## 2019-05-18 DIAGNOSIS — Z6820 Body mass index (BMI) 20.0-20.9, adult: Secondary | ICD-10-CM | POA: Diagnosis not present

## 2019-05-18 DIAGNOSIS — G894 Chronic pain syndrome: Secondary | ICD-10-CM | POA: Diagnosis not present

## 2019-05-18 DIAGNOSIS — M0579 Rheumatoid arthritis with rheumatoid factor of multiple sites without organ or systems involvement: Secondary | ICD-10-CM | POA: Diagnosis not present

## 2019-05-18 DIAGNOSIS — Z79899 Other long term (current) drug therapy: Secondary | ICD-10-CM | POA: Diagnosis not present

## 2019-05-18 DIAGNOSIS — M150 Primary generalized (osteo)arthritis: Secondary | ICD-10-CM | POA: Diagnosis not present

## 2019-05-31 DIAGNOSIS — M545 Low back pain: Secondary | ICD-10-CM | POA: Diagnosis not present

## 2019-05-31 DIAGNOSIS — G8929 Other chronic pain: Secondary | ICD-10-CM | POA: Diagnosis not present

## 2019-06-01 ENCOUNTER — Ambulatory Visit (HOSPITAL_COMMUNITY)
Admission: RE | Admit: 2019-06-01 | Discharge: 2019-06-01 | Disposition: A | Discharge status: Home / Self Care | Payer: BC Managed Care – PPO | Source: Ambulatory Visit | Attending: Emergency Medicine | Admitting: Emergency Medicine

## 2019-06-01 ENCOUNTER — Other Ambulatory Visit: Payer: Self-pay

## 2019-06-01 DIAGNOSIS — J432 Centrilobular emphysema: Secondary | ICD-10-CM | POA: Diagnosis not present

## 2019-06-01 DIAGNOSIS — R918 Other nonspecific abnormal finding of lung field: Secondary | ICD-10-CM | POA: Diagnosis not present

## 2019-06-05 DIAGNOSIS — M545 Low back pain: Secondary | ICD-10-CM | POA: Diagnosis not present

## 2019-06-05 DIAGNOSIS — Z6820 Body mass index (BMI) 20.0-20.9, adult: Secondary | ICD-10-CM | POA: Diagnosis not present

## 2019-06-07 ENCOUNTER — Telehealth: Payer: Self-pay | Admitting: Emergency Medicine

## 2019-06-07 NOTE — Telephone Encounter (Signed)
Dr. Lamonte Sakai please review CT.

## 2019-06-07 NOTE — Telephone Encounter (Signed)
Please let the patient know that his CT scan able, no change in the pulmonary nodules.  We should not need to continue to follow these with serial CT scans unless there is a clinical change.  This is good news.  Okay to send a copy as requested

## 2019-06-08 NOTE — Telephone Encounter (Signed)
Spoke with pt's wife, June. She is aware of results. Copy of CT has been sent to pt's PCP. Nothing further was needed.

## 2019-06-11 DIAGNOSIS — M0579 Rheumatoid arthritis with rheumatoid factor of multiple sites without organ or systems involvement: Secondary | ICD-10-CM | POA: Diagnosis not present

## 2019-07-10 DIAGNOSIS — M0579 Rheumatoid arthritis with rheumatoid factor of multiple sites without organ or systems involvement: Secondary | ICD-10-CM | POA: Diagnosis not present

## 2019-08-07 DIAGNOSIS — M0579 Rheumatoid arthritis with rheumatoid factor of multiple sites without organ or systems involvement: Secondary | ICD-10-CM | POA: Diagnosis not present

## 2019-09-04 DIAGNOSIS — M0579 Rheumatoid arthritis with rheumatoid factor of multiple sites without organ or systems involvement: Secondary | ICD-10-CM | POA: Diagnosis not present

## 2019-09-04 DIAGNOSIS — Z111 Encounter for screening for respiratory tuberculosis: Secondary | ICD-10-CM | POA: Diagnosis not present

## 2019-09-05 DIAGNOSIS — M069 Rheumatoid arthritis, unspecified: Secondary | ICD-10-CM | POA: Diagnosis not present

## 2019-09-05 DIAGNOSIS — G894 Chronic pain syndrome: Secondary | ICD-10-CM | POA: Diagnosis not present

## 2019-09-05 DIAGNOSIS — R634 Abnormal weight loss: Secondary | ICD-10-CM | POA: Diagnosis not present

## 2019-10-02 DIAGNOSIS — M0579 Rheumatoid arthritis with rheumatoid factor of multiple sites without organ or systems involvement: Secondary | ICD-10-CM | POA: Diagnosis not present

## 2019-11-07 DIAGNOSIS — M0579 Rheumatoid arthritis with rheumatoid factor of multiple sites without organ or systems involvement: Secondary | ICD-10-CM | POA: Diagnosis not present

## 2019-11-19 DIAGNOSIS — G894 Chronic pain syndrome: Secondary | ICD-10-CM | POA: Diagnosis not present

## 2019-11-19 DIAGNOSIS — Z681 Body mass index (BMI) 19 or less, adult: Secondary | ICD-10-CM | POA: Diagnosis not present

## 2019-11-19 DIAGNOSIS — M150 Primary generalized (osteo)arthritis: Secondary | ICD-10-CM | POA: Diagnosis not present

## 2019-11-19 DIAGNOSIS — Z79899 Other long term (current) drug therapy: Secondary | ICD-10-CM | POA: Diagnosis not present

## 2019-11-19 DIAGNOSIS — M0579 Rheumatoid arthritis with rheumatoid factor of multiple sites without organ or systems involvement: Secondary | ICD-10-CM | POA: Diagnosis not present

## 2019-12-05 DIAGNOSIS — Z79899 Other long term (current) drug therapy: Secondary | ICD-10-CM | POA: Diagnosis not present

## 2019-12-05 DIAGNOSIS — M0579 Rheumatoid arthritis with rheumatoid factor of multiple sites without organ or systems involvement: Secondary | ICD-10-CM | POA: Diagnosis not present

## 2019-12-06 DIAGNOSIS — M542 Cervicalgia: Secondary | ICD-10-CM | POA: Diagnosis not present

## 2019-12-06 DIAGNOSIS — Z79899 Other long term (current) drug therapy: Secondary | ICD-10-CM | POA: Diagnosis not present

## 2020-01-08 DIAGNOSIS — M0579 Rheumatoid arthritis with rheumatoid factor of multiple sites without organ or systems involvement: Secondary | ICD-10-CM | POA: Diagnosis not present

## 2020-02-05 DIAGNOSIS — M0579 Rheumatoid arthritis with rheumatoid factor of multiple sites without organ or systems involvement: Secondary | ICD-10-CM | POA: Diagnosis not present

## 2020-02-19 DIAGNOSIS — M0579 Rheumatoid arthritis with rheumatoid factor of multiple sites without organ or systems involvement: Secondary | ICD-10-CM | POA: Diagnosis not present

## 2020-02-19 DIAGNOSIS — Z681 Body mass index (BMI) 19 or less, adult: Secondary | ICD-10-CM | POA: Diagnosis not present

## 2020-02-19 DIAGNOSIS — Z79899 Other long term (current) drug therapy: Secondary | ICD-10-CM | POA: Diagnosis not present

## 2020-02-19 DIAGNOSIS — G894 Chronic pain syndrome: Secondary | ICD-10-CM | POA: Diagnosis not present

## 2020-02-19 DIAGNOSIS — M150 Primary generalized (osteo)arthritis: Secondary | ICD-10-CM | POA: Diagnosis not present

## 2020-03-07 DIAGNOSIS — M5136 Other intervertebral disc degeneration, lumbar region: Secondary | ICD-10-CM | POA: Diagnosis not present

## 2020-03-07 DIAGNOSIS — M542 Cervicalgia: Secondary | ICD-10-CM | POA: Diagnosis not present

## 2020-03-07 DIAGNOSIS — Z5181 Encounter for therapeutic drug level monitoring: Secondary | ICD-10-CM | POA: Diagnosis not present

## 2020-03-07 DIAGNOSIS — Z79899 Other long term (current) drug therapy: Secondary | ICD-10-CM | POA: Diagnosis not present

## 2020-05-01 DIAGNOSIS — Z20828 Contact with and (suspected) exposure to other viral communicable diseases: Secondary | ICD-10-CM | POA: Diagnosis not present

## 2020-05-01 DIAGNOSIS — J441 Chronic obstructive pulmonary disease with (acute) exacerbation: Secondary | ICD-10-CM | POA: Diagnosis not present

## 2020-05-29 DIAGNOSIS — Z720 Tobacco use: Secondary | ICD-10-CM | POA: Diagnosis not present

## 2020-05-29 DIAGNOSIS — E039 Hypothyroidism, unspecified: Secondary | ICD-10-CM | POA: Diagnosis not present

## 2020-05-29 DIAGNOSIS — Z681 Body mass index (BMI) 19 or less, adult: Secondary | ICD-10-CM | POA: Diagnosis not present

## 2020-05-29 DIAGNOSIS — M069 Rheumatoid arthritis, unspecified: Secondary | ICD-10-CM | POA: Diagnosis not present

## 2020-05-29 DIAGNOSIS — J449 Chronic obstructive pulmonary disease, unspecified: Secondary | ICD-10-CM | POA: Diagnosis not present

## 2020-05-29 DIAGNOSIS — R413 Other amnesia: Secondary | ICD-10-CM | POA: Diagnosis not present

## 2020-05-29 DIAGNOSIS — I700 Atherosclerosis of aorta: Secondary | ICD-10-CM | POA: Diagnosis not present

## 2020-05-29 DIAGNOSIS — R634 Abnormal weight loss: Secondary | ICD-10-CM | POA: Diagnosis not present

## 2020-07-09 DIAGNOSIS — G894 Chronic pain syndrome: Secondary | ICD-10-CM | POA: Diagnosis not present

## 2020-07-09 DIAGNOSIS — G8929 Other chronic pain: Secondary | ICD-10-CM | POA: Diagnosis not present

## 2020-07-21 IMAGING — DX DG CHEST 2V
2 series · 2 of 2 positions shown · non-contrast
Comparison: 01/26/2019

CLINICAL DATA: Short of breath and congestion, history of asthma

EXAM:
CHEST - 2 VIEW

[chest pa]
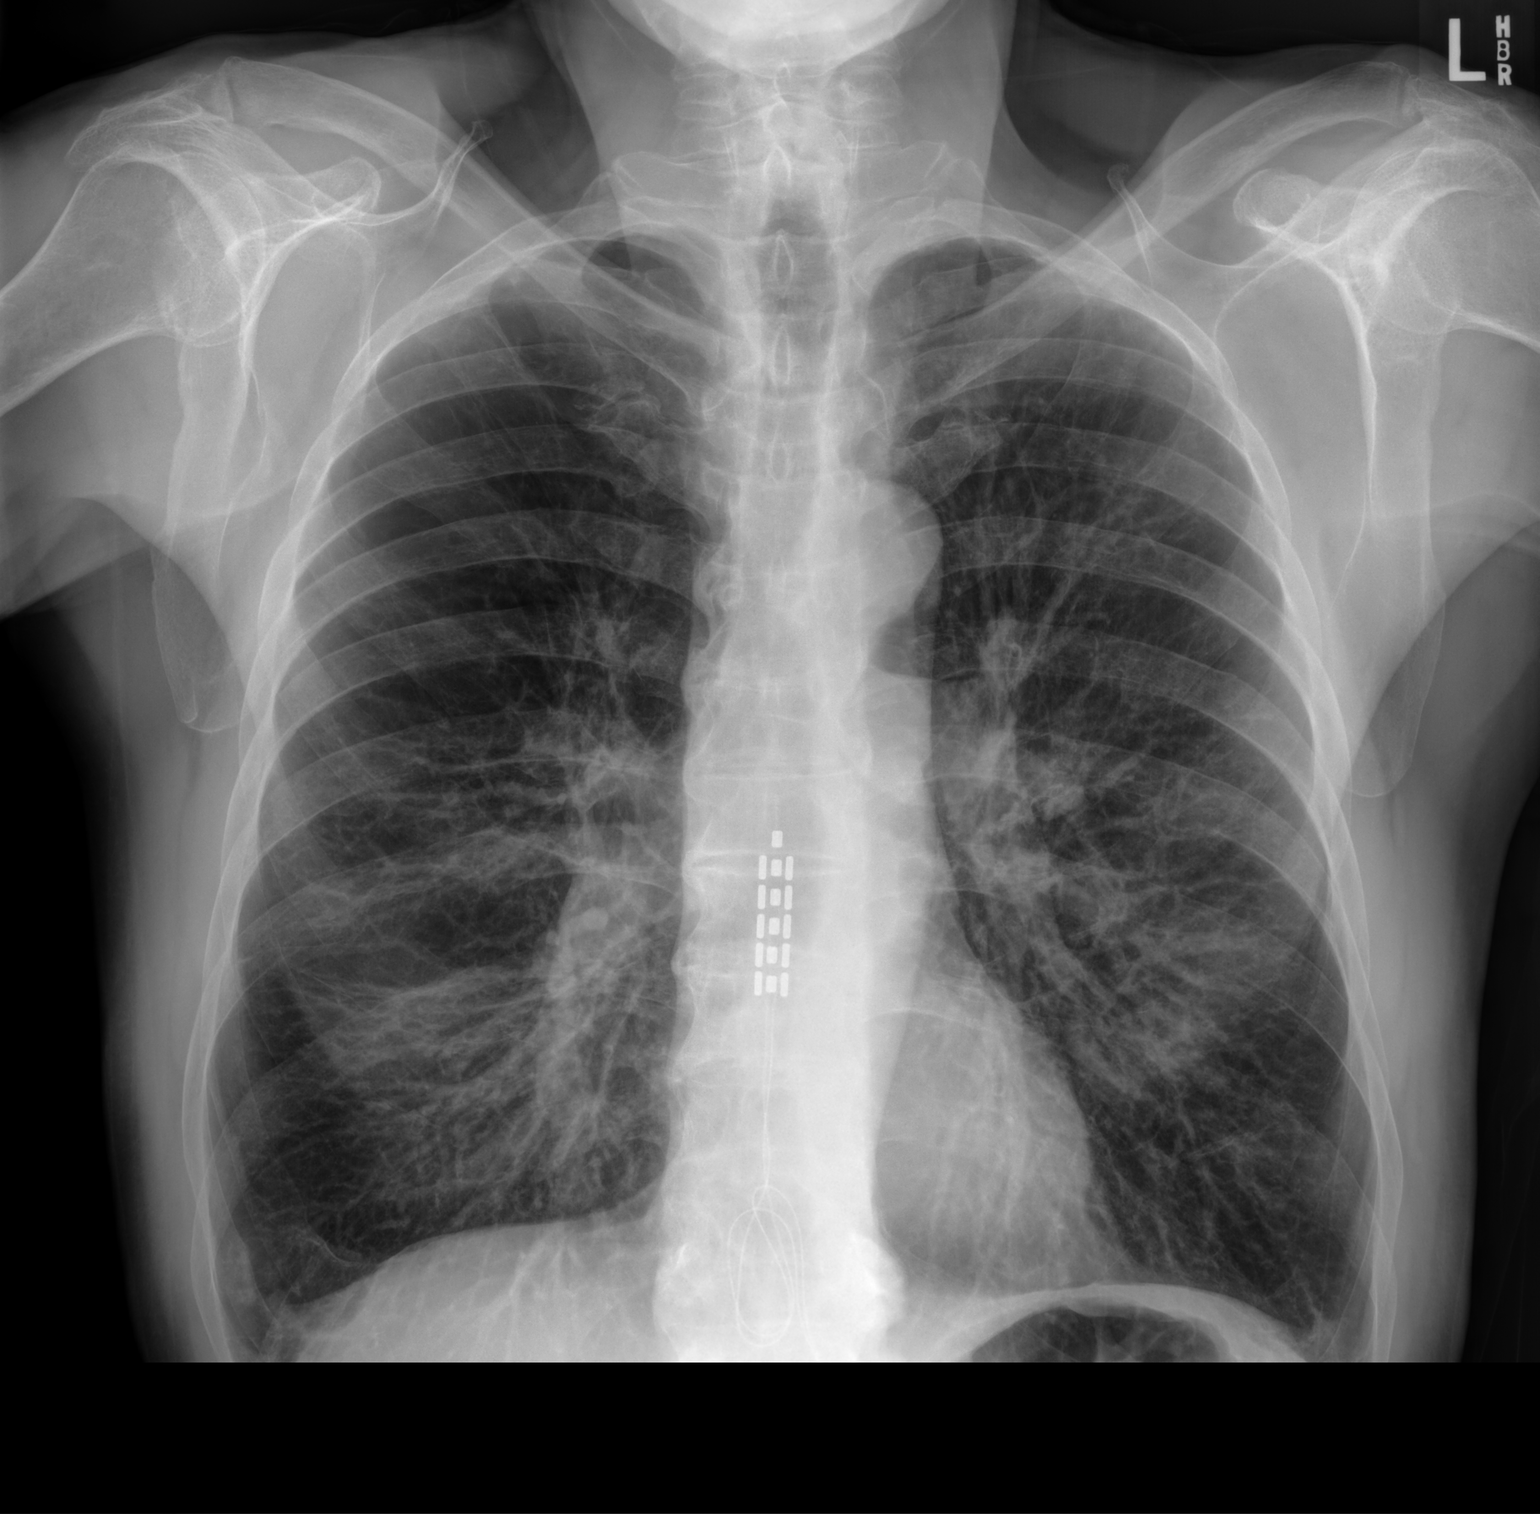

[chest lat]
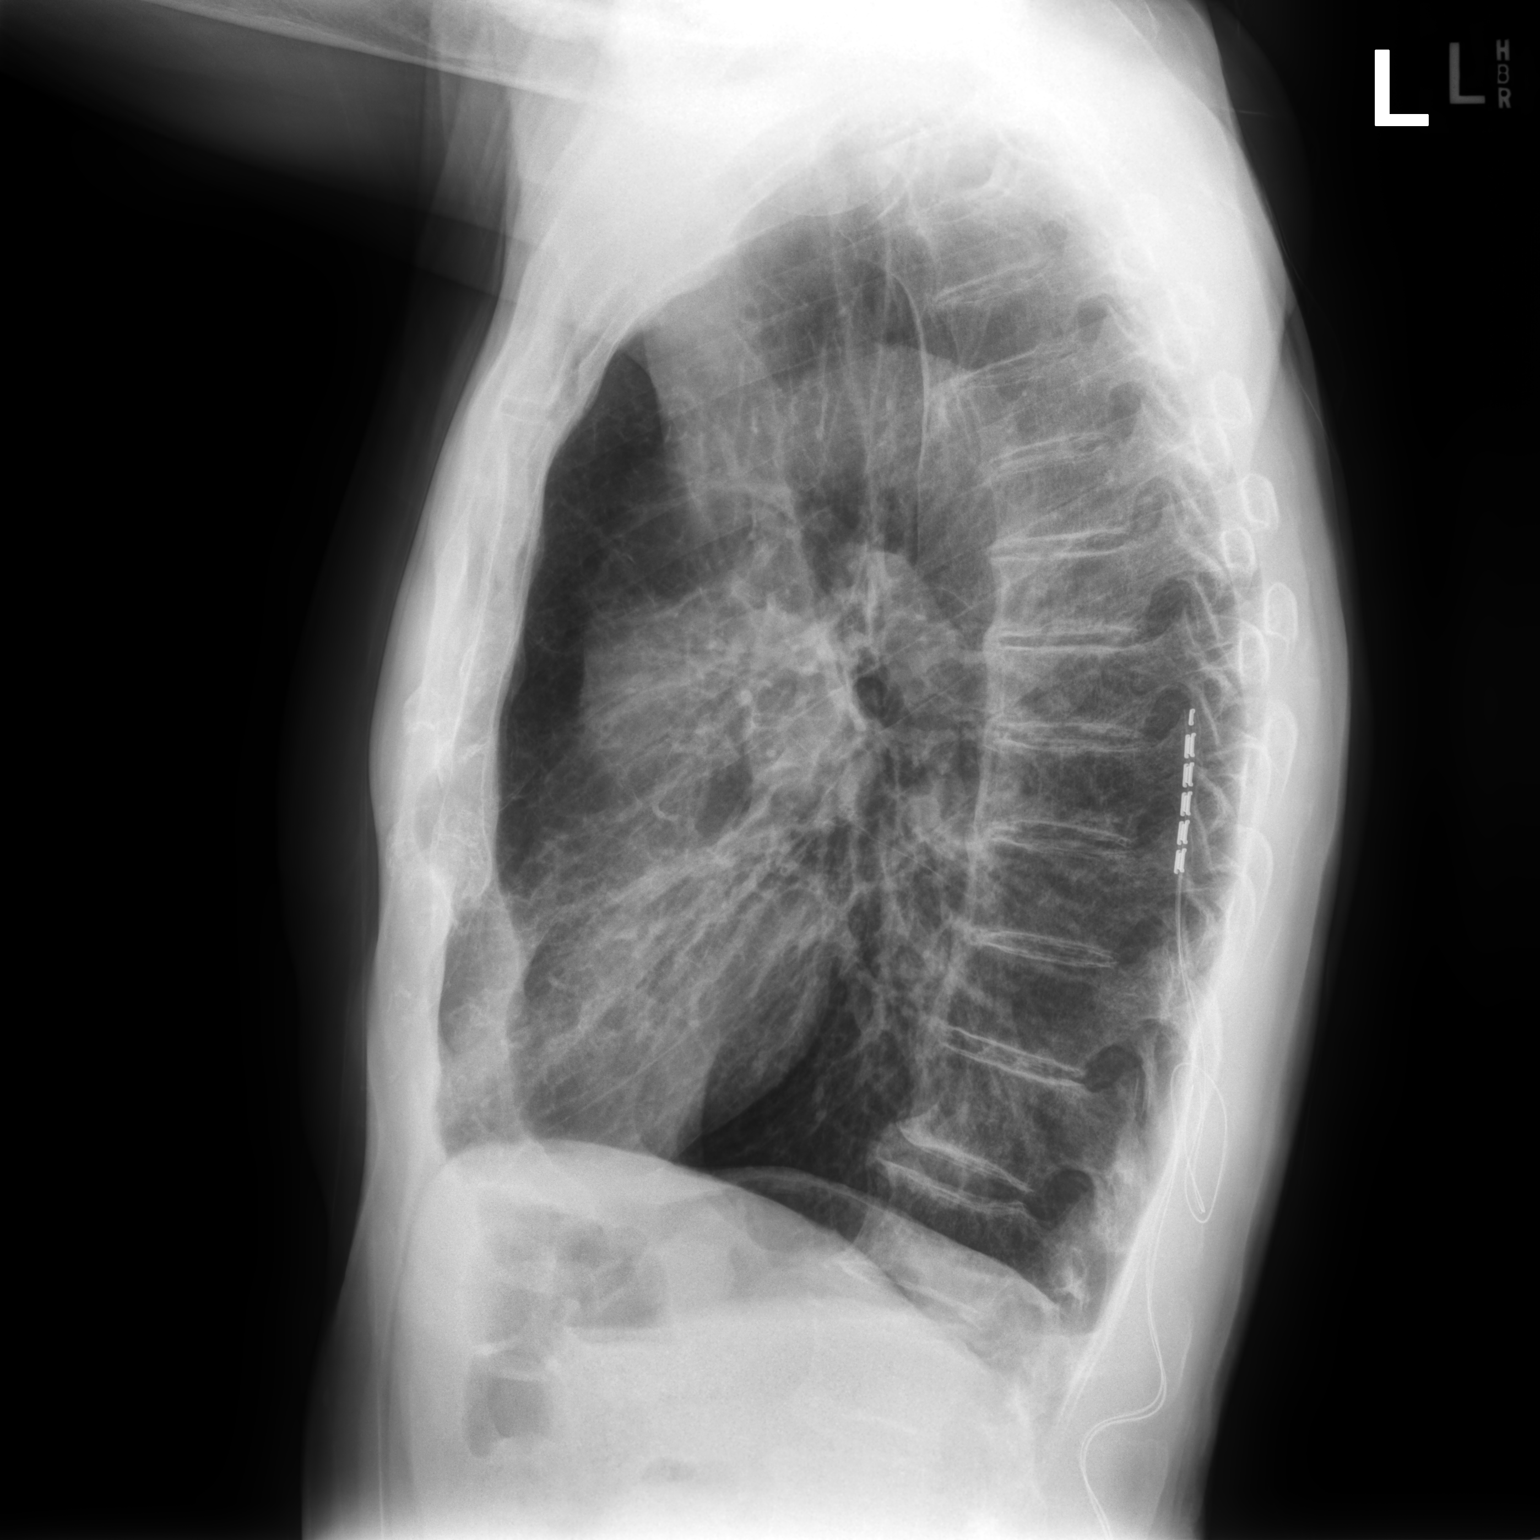

[2 of 2 positions shown; findings below may reference images not displayed]

FINDINGS: Frontal and lateral views of the chest demonstrates stable spinal
cord stimulator. Cardiac silhouette is unremarkable. The lungs
remain hyperinflated with background emphysema. Increased perihilar
bronchovascular markings could reflect reactive airway disease or
bronchitis. No airspace disease, effusion, or pneumothorax. No acute
bony abnormalities.
IMPRESSION: 1. Bilateral perihilar bronchovascular prominence consistent with
reactive airway disease or bronchitis.
2. No acute airspace disease.
3. Stable background emphysema.

## 2020-10-02 DIAGNOSIS — M150 Primary generalized (osteo)arthritis: Secondary | ICD-10-CM | POA: Diagnosis not present

## 2020-10-02 DIAGNOSIS — R634 Abnormal weight loss: Secondary | ICD-10-CM | POA: Diagnosis not present

## 2020-10-02 DIAGNOSIS — R5383 Other fatigue: Secondary | ICD-10-CM | POA: Diagnosis not present

## 2020-10-02 DIAGNOSIS — Z681 Body mass index (BMI) 19 or less, adult: Secondary | ICD-10-CM | POA: Diagnosis not present

## 2020-10-02 DIAGNOSIS — G894 Chronic pain syndrome: Secondary | ICD-10-CM | POA: Diagnosis not present

## 2020-10-02 DIAGNOSIS — Z79899 Other long term (current) drug therapy: Secondary | ICD-10-CM | POA: Diagnosis not present

## 2020-10-02 DIAGNOSIS — M0579 Rheumatoid arthritis with rheumatoid factor of multiple sites without organ or systems involvement: Secondary | ICD-10-CM | POA: Diagnosis not present

## 2020-12-09 DIAGNOSIS — M5136 Other intervertebral disc degeneration, lumbar region: Secondary | ICD-10-CM | POA: Diagnosis not present

## 2020-12-09 DIAGNOSIS — M542 Cervicalgia: Secondary | ICD-10-CM | POA: Diagnosis not present

## 2021-04-13 DIAGNOSIS — Z681 Body mass index (BMI) 19 or less, adult: Secondary | ICD-10-CM | POA: Diagnosis not present

## 2021-04-13 DIAGNOSIS — M150 Primary generalized (osteo)arthritis: Secondary | ICD-10-CM | POA: Diagnosis not present

## 2021-04-13 DIAGNOSIS — G894 Chronic pain syndrome: Secondary | ICD-10-CM | POA: Diagnosis not present

## 2021-04-13 DIAGNOSIS — M0579 Rheumatoid arthritis with rheumatoid factor of multiple sites without organ or systems involvement: Secondary | ICD-10-CM | POA: Diagnosis not present

## 2021-04-13 DIAGNOSIS — Z79899 Other long term (current) drug therapy: Secondary | ICD-10-CM | POA: Diagnosis not present

## 2021-12-03 ENCOUNTER — Encounter: Payer: Self-pay | Admitting: Internal Medicine

## 2024-02-29 ENCOUNTER — Ambulatory Visit: Payer: Self-pay | Admitting: Podiatry

## 2024-02-29 ENCOUNTER — Encounter: Payer: Self-pay | Admitting: Podiatry

## 2024-02-29 DIAGNOSIS — M79674 Pain in right toe(s): Secondary | ICD-10-CM

## 2024-02-29 DIAGNOSIS — M79675 Pain in left toe(s): Secondary | ICD-10-CM

## 2024-02-29 DIAGNOSIS — B351 Tinea unguium: Secondary | ICD-10-CM | POA: Diagnosis not present

## 2024-02-29 NOTE — Progress Notes (Signed)
"  °  Subjective:  Patient ID: Andrew [name], male    DOB: [birth year],   MRN: [id number]  Chief Complaint  Patient presents with   Nail Problem    Cut his toenails.    74 y.o. male presents for concern of thickened elongated and painful nails that are difficult to trim. Requesting to have them trimmed today. Has a history of RA and fibromyalgia unable to trim nails himself.   PCP:  Atilano Deward ORN, MD    . Denies any other pedal complaints. Denies n/v/f/c.   Past Medical History:  Diagnosis Date   Aneurysm    POSTERIOR BRAIN - NO FOLLOW UP X 40 YRS   Asthma    when I was real young   Bruises easily    Chronic back pain    Chronic bronchitis (HCC)    get it anytime I catch a cold (02/09/2013)   Collagen vascular disease    COPD (chronic obstructive pulmonary disease) (HCC)    no O2 use at home per pt.,uses breathing TX prn   Daily headache    here lately (02/09/2013)   Difficulty urinating    Emphysema    Enlarged prostate    Exertional shortness of breath    just walking from room to room sometimes (02/09/2013)   Fibromyalgia    Gastritis 2019   chronic inactive   H/O hiatal hernia    Hepatitis    I've been told I had hepatitis; had yellow jaundice when I wa young (02/09/2013)   Hypothyroidism    Inguinal hernia    Insomnia    Internal hemorrhoids    Osteoarthritis    Pneumonia 1970's; 2000's   I've had it twice (02/09/2013)   PONV (postoperative nausea and vomiting)    Rheumatoid arteritis (HCC)    Thyroid  disease    Tick bite of abdomen 07/10/15   completed doxycycline  Rx. had rash from abdomen and down anterior thighs    Objective:  Physical Exam: Vascular: DP/PT pulses 2/4 bilateral. CFT <3 seconds. Normal hair growth on digits. No edema.  Skin. No lacerations or abrasions bilateral feet. Nails 1-5 bilateral are thickened dystrophic and elognat4ed Musculoskeletal: MMT 5/5 bilateral lower extremities in DF, PF, Inversion and Eversion. Deceased ROM in DF of  ankle joint.  Neurological: Sensation intact to light touch.   Assessment:   1. Pain due to onychomycosis of toenails of both feet      Plan:  Patient was evaluated and treated and all questions answered. -ABN signed 02/29/24 -Mechanically debrided all nails 1-5 bilateral using sterile nail nipper and filed with dremel without incident  -Answered all patient questions -Patient to return  in 3 months for at risk foot care -Patient advised to call the office if any problems or questions arise in the meantime.   Asberry Failing, DPM    "

## 2024-03-05 ENCOUNTER — Ambulatory Visit: Admitting: Internal Medicine

## 2024-03-12 ENCOUNTER — Ambulatory Visit: Admitting: Internal Medicine

## 2024-03-15 ENCOUNTER — Ambulatory Visit (HOSPITAL_COMMUNITY)
Admission: RE | Admit: 2024-03-15 | Discharge: 2024-03-15 | Disposition: A | Source: Ambulatory Visit | Attending: Internal Medicine | Admitting: Internal Medicine

## 2024-03-15 ENCOUNTER — Encounter: Payer: Self-pay | Admitting: Internal Medicine

## 2024-03-15 ENCOUNTER — Ambulatory Visit: Admitting: Internal Medicine

## 2024-03-15 VITALS — BP 153/80 | HR 92 | Ht 70.0 in | Wt 125.0 lb

## 2024-03-15 DIAGNOSIS — J9611 Chronic respiratory failure with hypoxia: Secondary | ICD-10-CM | POA: Insufficient documentation

## 2024-03-15 DIAGNOSIS — J449 Chronic obstructive pulmonary disease, unspecified: Secondary | ICD-10-CM

## 2024-03-15 DIAGNOSIS — F17210 Nicotine dependence, cigarettes, uncomplicated: Secondary | ICD-10-CM | POA: Insufficient documentation

## 2024-03-15 MED ORDER — IPRATROPIUM-ALBUTEROL 0.5-2.5 (3) MG/3ML IN SOLN: 3.0000 mL | Freq: Four times a day (QID) | RESPIRATORY_TRACT | 11 refills | Status: AC | PRN

## 2024-03-15 NOTE — Progress Notes (Unsigned)
 "   Andrew [name], male    DOB: 1951-01-13    MRN: [id number]   Brief patient profile:  8   yowm  active smoker  former [name]  Pt (last seen in 2021)self-referred back to pulmonary clinic in [place]  03/15/2024  for post [facility] 01/2024  f/u per [name]       History of Present Illness  03/15/2024  Pulmonary/ 1st office eval/ [name] / [place] [facility]  Chief Complaint  Patient presents with   Establish Care    In [facility] for pna and referred back to pulm dr - did see [name] but started back smoking and stopped seeing him. DOE   Dyspnea:  room to room  Cough: variable/ rattle really not bringing it up well  Sleep: able to sleep flat  ok one pillow  SABA use: only has neb x one  02 use: Aug 2025 broke ribs > 02 since then on ? 2lpm concentrator  LDSCT:deferred for now - pt reluctant to do anything   No obvious day to day or daytime pattern/variability or assoc excess/ purulent sputum or mucus plugs or hemoptysis or cp or chest tightness, subjective wheeze or overt sinus or hb symptoms.    Also denies any obvious fluctuation of symptoms with weather or environmental changes or other aggravating or alleviating factors except as outlined above   No unusual exposure hx or h/o childhood pna/ asthma or knowledge of premature birth.  Current Allergies, Complete Past Medical History, Past Surgical History, Family History, and Social History were reviewed in [employer] record.  ROS  The following are not active complaints unless bolded Hoarseness, sore throat, dysphagia, dental problems, itching, sneezing,  nasal congestion or discharge of excess mucus or purulent secretions, ear ache,   fever, chills, sweats, unintended wt loss or wt gain, classically pleuritic or exertional cp,  orthopnea pnd or arm/hand swelling  or leg swelling, presyncope, palpitations, abdominal pain, anorexia, nausea, vomiting, diarrhea  or change in bowel habits or change in bladder habits, change in  stools or change in urine, dysuria, hematuria,  rash, arthralgias, visual complaints, headache, numbness, weakness or ataxia or problems with walking or coordination,  change in mood or  memory.            Outpatient Medications Prior to Visit  Medication Sig Dispense Refill   albuterol  (PROVENTIL ) (2.5 MG/3ML) 0.083% nebulizer solution Take 2.5 mg by nebulization every 6 (six) hours as needed for wheezing or shortness of breath.     albuterol  (VENTOLIN  HFA) 108 (90 Base) MCG/ACT inhaler Inhale 2 puffs into the lungs every 6 (six) hours as needed for wheezing or shortness of breath. Reported on 03/31/2015 8 g 6   aspirin EC 81 MG tablet Take 81 mg by mouth.     folic acid  (FOLVITE ) 1 MG tablet Take 1 mg by mouth daily.     levothyroxine  (SYNTHROID , LEVOTHROID) 75 MCG tablet Take 75 mcg by mouth daily before breakfast.     methotrexate (RHEUMATREX) 2.5 MG tablet Take 25 mg by mouth once a week. 10 pills every Friday     morphine  (MS CONTIN ) 30 MG 12 hr tablet Take 30 mg by mouth every 12 (twelve) hours.  0   oxyCODONE -acetaminophen  (PERCOCET) 10-325 MG per tablet Take 1 tablet by mouth 3 (three) times daily as needed for pain. Pain     zolpidem  (AMBIEN ) 10 MG tablet Take 10 mg by mouth at bedtime.     Tiotropium Bromide -Olodaterol (STIOLTO  RESPIMAT) 2.5-2.5 MCG/ACT AERS Inhale 2 puffs into the lungs daily. (Patient not taking: Reported on 03/15/2024) 4 g 0   Facility-Administered Medications Prior to Visit  Medication Dose Route Frequency Provider Last Rate Last Admin   0.9 %  sodium chloride  infusion  500 mL Intravenous Once [name], [name], MD        Past Medical History:  Diagnosis Date   Aneurysm    POSTERIOR BRAIN - NO FOLLOW UP X 40 YRS   Asthma    when I was real young   Bruises easily    Chronic back pain    Chronic bronchitis (HCC)    get it anytime I catch a cold (02/09/2013)   Collagen vascular disease    COPD (chronic obstructive pulmonary disease) (HCC)    no O2 use at  home per pt.,uses breathing TX prn   Daily headache    here lately (02/09/2013)   Difficulty urinating    Emphysema    Enlarged prostate    Exertional shortness of breath    just walking from room to room sometimes (02/09/2013)   Fibromyalgia    Gastritis 2019   chronic inactive   H/O hiatal hernia    Hepatitis    I've been told I had hepatitis; had yellow jaundice when I wa young (02/09/2013)   Hypothyroidism    Inguinal hernia    Insomnia    Internal hemorrhoids    Osteoarthritis    Pneumonia 1970's; 2000's   I've had it twice (02/09/2013)   PONV (postoperative nausea and vomiting)    Rheumatoid arteritis (HCC)    Thyroid  disease    Tick bite of abdomen 07/10/15   completed doxycycline  Rx. had rash from abdomen and down anterior thighs      Objective:     BP (!) 153/80   Pulse 92   Ht 5' 10 (1.778 m)   Wt 125 lb (56.7 kg)   SpO2 94% Comment: ra  BMI 17.94 kg/m   SpO2: 94 % (ra) disheveled eldery wm      HEENT : Oropharynx  clear / edentulous    NECK :  without  apparent JVD/ palpable Nodes/TM    LUNGS: no acc muscle use,  Min barrel  contour chest wall with bilateral distant exp rhonchi  and  without cough on insp or exp maneuvers and min  Hyperresonant  to  percussion bilaterally    CV:  RRR  no s3 or murmur or increase in P2, and no edema   ABD:  soft and nontender    MS:  Nl gait/ ext warm without deformities Or obvious joint restrictions  calf tenderness, cyanosis or clubbing     SKIN: warm and dry without lesions    NEURO:  alert, approp, nl sensorium with  no motor or cerebellar deficits apparent.            Assessment      "

## 2024-03-15 NOTE — Patient Instructions (Addendum)
 Change your nebulizer to duoneb up to 4 x daily especially before activity you know will make you short of breath.  Make sure you check your oxygen  saturation  AT  your highest level of activity (not after you stop)   to be sure it stays over 90% and adjust  02 flow upward to maintain this level if needed but remember to turn it back to previous settings when you stop (to conserve your supply).   For cough/ congestion > mucinex  or mucinex  dm(can increase bp where mucinex  can't)   up to maximum of  1200 mg every 12 hours and use the flutter valve as much as you can    Please remember to go to the lab department   for your tests - we will call you with the results when they are available.      Please remember to go to the  x-ray department  @  [facility] for your tests - we will call you with the results when they are available      The key is to stop smoking completely before smoking completely stops you!      Please schedule a follow up visit in 3 months but call sooner if needed

## 2024-05-29 ENCOUNTER — Ambulatory Visit: Admitting: Podiatry

## 2024-06-12 ENCOUNTER — Ambulatory Visit: Admitting: Internal Medicine
